# Patient Record
Sex: Female | Born: 1955 | Race: White | Hispanic: No | Smoking: Former smoker
Health system: Southern US, Community
[De-identification: ages and names within clinical notes are randomized; demographics above are authoritative.]

## PROBLEM LIST (undated history)

## (undated) DIAGNOSIS — I10 Essential (primary) hypertension: Secondary | ICD-10-CM

## (undated) DIAGNOSIS — E079 Disorder of thyroid, unspecified: Secondary | ICD-10-CM

---

## 2019-10-07 ENCOUNTER — Ambulatory Visit: Payer: Self-pay | Attending: Internal Medicine

## 2019-10-07 DIAGNOSIS — Z23 Encounter for immunization: Secondary | ICD-10-CM

## 2019-10-07 NOTE — Progress Notes (Signed)
   Covid-19 Vaccination Clinic  Name:  Aliah Eriksson    MRN: [id number] DOB: [birth year]  10/07/2019  Ms. Gunnerson was observed post Covid-19 immunization for 15 minutes without incident. She was provided with Vaccine Information Sheet and instruction to access the V-Safe system.   Ms. Bui was instructed to call 911 with any severe reactions post vaccine: Marland Kitchen Difficulty breathing  . Swelling of face and throat  . A fast heartbeat  . A bad rash all over body  . Dizziness and weakness   Immunizations Administered    Name Date Dose VIS Date Route   Pfizer COVID-19 Vaccine 10/07/2019  9:44 AM 0.3 mL 06/25/2019 Intramuscular   Manufacturer: [place]   Lot: [id number]   [place]: [id number]

## 2019-11-01 ENCOUNTER — Ambulatory Visit: Payer: Self-pay | Attending: Internal Medicine

## 2019-11-01 DIAGNOSIS — Z23 Encounter for immunization: Secondary | ICD-10-CM

## 2019-11-01 NOTE — Progress Notes (Signed)
   Covid-19 Vaccination Clinic  Name:  Acire Tang    MRN: [id number] DOB: 1955-08-12  11/01/2019  Ms. Cowper was observed post Covid-19 immunization for 15 minutes without incident. She was provided with Vaccine Information Sheet and instruction to access the V-Safe system.   Ms. Laughner was instructed to call 911 with any severe reactions post vaccine: Marland Kitchen Difficulty breathing  . Swelling of face and throat  . A fast heartbeat  . A bad rash all over body  . Dizziness and weakness   Immunizations Administered    Name Date Dose VIS Date Route   Pfizer COVID-19 Vaccine 11/01/2019  9:22 AM 0.3 mL 09/08/2018 Intramuscular   Manufacturer: [place]   Lot: [id number]   [place]: [id number]

## 2021-01-17 ENCOUNTER — Inpatient Hospital Stay (HOSPITAL_COMMUNITY)
Admission: EM | Admit: 2021-01-17 | Discharge: 2021-01-19 | DRG: 054 | Disposition: A | Discharge status: Home / Self Care | Payer: 59 | Attending: Family Medicine | Admitting: Family Medicine

## 2021-01-17 ENCOUNTER — Emergency Department (HOSPITAL_COMMUNITY): Payer: 59

## 2021-01-17 ENCOUNTER — Inpatient Hospital Stay (HOSPITAL_COMMUNITY): Payer: 59

## 2021-01-17 ENCOUNTER — Other Ambulatory Visit: Payer: Self-pay

## 2021-01-17 ENCOUNTER — Encounter (HOSPITAL_COMMUNITY): Payer: Self-pay

## 2021-01-17 DIAGNOSIS — C3492 Malignant neoplasm of unspecified part of left bronchus or lung: Secondary | ICD-10-CM | POA: Diagnosis not present

## 2021-01-17 DIAGNOSIS — R630 Anorexia: Secondary | ICD-10-CM | POA: Diagnosis present

## 2021-01-17 DIAGNOSIS — F17290 Nicotine dependence, other tobacco product, uncomplicated: Secondary | ICD-10-CM | POA: Diagnosis present

## 2021-01-17 DIAGNOSIS — Z8052 Family history of malignant neoplasm of bladder: Secondary | ICD-10-CM | POA: Diagnosis not present

## 2021-01-17 DIAGNOSIS — C3402 Malignant neoplasm of left main bronchus: Secondary | ICD-10-CM | POA: Diagnosis present

## 2021-01-17 DIAGNOSIS — C7931 Secondary malignant neoplasm of brain: Secondary | ICD-10-CM | POA: Diagnosis present

## 2021-01-17 DIAGNOSIS — H9201 Otalgia, right ear: Secondary | ICD-10-CM | POA: Diagnosis present

## 2021-01-17 DIAGNOSIS — E785 Hyperlipidemia, unspecified: Secondary | ICD-10-CM | POA: Diagnosis present

## 2021-01-17 DIAGNOSIS — I10 Essential (primary) hypertension: Secondary | ICD-10-CM | POA: Diagnosis present

## 2021-01-17 DIAGNOSIS — E039 Hypothyroidism, unspecified: Secondary | ICD-10-CM | POA: Diagnosis present

## 2021-01-17 DIAGNOSIS — I160 Hypertensive urgency: Secondary | ICD-10-CM | POA: Diagnosis present

## 2021-01-17 DIAGNOSIS — Z20822 Contact with and (suspected) exposure to covid-19: Secondary | ICD-10-CM | POA: Diagnosis present

## 2021-01-17 DIAGNOSIS — E876 Hypokalemia: Secondary | ICD-10-CM | POA: Diagnosis present

## 2021-01-17 DIAGNOSIS — G9389 Other specified disorders of brain: Secondary | ICD-10-CM | POA: Diagnosis present

## 2021-01-17 DIAGNOSIS — R29810 Facial weakness: Secondary | ICD-10-CM | POA: Diagnosis present

## 2021-01-17 DIAGNOSIS — E871 Hypo-osmolality and hyponatremia: Secondary | ICD-10-CM | POA: Diagnosis present

## 2021-01-17 DIAGNOSIS — C771 Secondary and unspecified malignant neoplasm of intrathoracic lymph nodes: Secondary | ICD-10-CM | POA: Diagnosis present

## 2021-01-17 DIAGNOSIS — J90 Pleural effusion, not elsewhere classified: Secondary | ICD-10-CM | POA: Diagnosis present

## 2021-01-17 DIAGNOSIS — J9811 Atelectasis: Secondary | ICD-10-CM | POA: Diagnosis present

## 2021-01-17 DIAGNOSIS — R918 Other nonspecific abnormal finding of lung field: Secondary | ICD-10-CM

## 2021-01-17 DIAGNOSIS — G936 Cerebral edema: Secondary | ICD-10-CM | POA: Diagnosis present

## 2021-01-17 DIAGNOSIS — Z9889 Other specified postprocedural states: Secondary | ICD-10-CM

## 2021-01-17 HISTORY — DX: Disorder of thyroid, unspecified: E07.9

## 2021-01-17 HISTORY — DX: Essential (primary) hypertension: I10

## 2021-01-17 LAB — COMPREHENSIVE METABOLIC PANEL
ALT: 17 U/L (ref 0–44)
AST: 20 U/L (ref 15–41)
Albumin: 4.2 g/dL (ref 3.5–5.0)
Alkaline Phosphatase: 83 U/L (ref 38–126)
Anion gap: 11 (ref 5–15)
BUN: 14 mg/dL (ref 8–23)
CO2: 25 mmol/L (ref 22–32)
Calcium: 9.1 mg/dL (ref 8.9–10.3)
Chloride: 93 mmol/L — ABNORMAL LOW (ref 98–111)
Creatinine, Ser: 0.56 mg/dL (ref 0.44–1.00)
GFR, Estimated: >60 mL/min (ref >60)
Glucose, Bld: 103 mg/dL — ABNORMAL HIGH (ref 70–99)
Potassium: 3.2 mmol/L — ABNORMAL LOW (ref 3.5–5.1)
Sodium: 129 mmol/L — ABNORMAL LOW (ref 135–145)
Total Bilirubin: 0.7 mg/dL (ref 0.3–1.2)
Total Protein: 7.3 g/dL (ref 6.5–8.1)

## 2021-01-17 LAB — CBC WITH DIFFERENTIAL/PLATELET
Abs Immature Granulocytes: 0.04 10*3/uL (ref 0.00–0.07)
Basophils Absolute: 0 10*3/uL (ref 0.0–0.1)
Basophils Relative: 0 %
Eosinophils Absolute: 0.2 10*3/uL (ref 0.0–0.5)
Eosinophils Relative: 2 %
HCT: 36.6 % (ref 36.0–46.0)
Hemoglobin: 12.3 g/dL (ref 12.0–15.0)
Immature Granulocytes: 0 %
Lymphocytes Relative: 11 %
Lymphs Abs: 1.1 10*3/uL (ref 0.7–4.0)
MCH: 29 pg (ref 26.0–34.0)
MCHC: 33.6 g/dL (ref 30.0–36.0)
MCV: 86.3 fL (ref 80.0–100.0)
Monocytes Absolute: 0.9 10*3/uL (ref 0.1–1.0)
Monocytes Relative: 9 %
Neutro Abs: 7.4 10*3/uL (ref 1.7–7.7)
Neutrophils Relative %: 78 %
Platelets: 344 10*3/uL (ref 150–400)
RBC: 4.24 MIL/uL (ref 3.87–5.11)
RDW: 13 % (ref 11.5–15.5)
WBC: 9.7 10*3/uL (ref 4.0–10.5)
nRBC: 0 % (ref 0.0–0.2)

## 2021-01-17 LAB — PROTIME-INR
INR: 1 (ref 0.8–1.2)
Prothrombin Time: 12.8 seconds (ref 11.4–15.2)

## 2021-01-17 LAB — RESP PANEL BY RT-PCR (FLU A&B, COVID) ARPGX2
Influenza A by PCR: NEGATIVE
Influenza B by PCR: NEGATIVE
SARS Coronavirus 2 by RT PCR: NEGATIVE

## 2021-01-17 MED ORDER — ACETAMINOPHEN 650 MG RE SUPP: 650.0000 mg | Freq: Four times a day (QID) | RECTAL | Status: DC | PRN

## 2021-01-17 MED ORDER — HYDRALAZINE HCL 20 MG/ML IJ SOLN
5.0000 mg | INTRAMUSCULAR | Status: DC | PRN
  Administered 2021-01-18 (×2): 5 mg via INTRAVENOUS
  Filled 2021-01-17 (×2): qty 1

## 2021-01-17 MED ORDER — GADOBUTROL 1 MMOL/ML IV SOLN
5.5000 mL | Freq: Once | INTRAVENOUS | Status: AC | PRN
  Administered 2021-01-17: 5.5 mL via INTRAVENOUS

## 2021-01-17 MED ORDER — DEXAMETHASONE 4 MG PO TABS
6.0000 mg | ORAL_TABLET | Freq: Four times a day (QID) | ORAL | Status: DC
  Administered 2021-01-18 – 2021-01-19 (×7): 6 mg via ORAL
  Filled 2021-01-17 (×7): qty 1

## 2021-01-17 MED ORDER — LEVETIRACETAM 500 MG PO TABS
500.0000 mg | ORAL_TABLET | Freq: Two times a day (BID) | ORAL | Status: DC
  Administered 2021-01-17 – 2021-01-19 (×4): 500 mg via ORAL
  Filled 2021-01-17 (×4): qty 1

## 2021-01-17 MED ORDER — POTASSIUM CHLORIDE CRYS ER 20 MEQ PO TBCR
40.0000 meq | EXTENDED_RELEASE_TABLET | Freq: Once | ORAL | Status: AC
  Administered 2021-01-17: 40 meq via ORAL
  Filled 2021-01-17: qty 2

## 2021-01-17 MED ORDER — IOHEXOL 300 MG/ML  SOLN
100.0000 mL | Freq: Once | INTRAMUSCULAR | Status: AC | PRN
  Administered 2021-01-17: 100 mL via INTRAVENOUS

## 2021-01-17 MED ORDER — OLOPATADINE HCL 0.1 % OP SOLN
1.0000 [drp] | Freq: Every day | OPHTHALMIC | Status: DC
  Filled 2021-01-17: qty 5

## 2021-01-17 MED ORDER — ACETAMINOPHEN 325 MG PO TABS: 650.0000 mg | ORAL_TABLET | Freq: Four times a day (QID) | ORAL | Status: DC | PRN

## 2021-01-17 MED ORDER — ATORVASTATIN CALCIUM 10 MG PO TABS
10.0000 mg | ORAL_TABLET | Freq: Every day | ORAL | Status: DC
  Administered 2021-01-18 – 2021-01-19 (×2): 10 mg via ORAL
  Filled 2021-01-17 (×2): qty 1

## 2021-01-17 MED ORDER — LEVOTHYROXINE SODIUM 75 MCG PO TABS
75.0000 ug | ORAL_TABLET | Freq: Every day | ORAL | Status: DC
  Administered 2021-01-18 – 2021-01-19 (×2): 75 ug via ORAL
  Filled 2021-01-17 (×2): qty 1

## 2021-01-17 MED ORDER — DEXAMETHASONE SODIUM PHOSPHATE 10 MG/ML IJ SOLN
10.0000 mg | Freq: Once | INTRAMUSCULAR | Status: AC
  Administered 2021-01-17: 10 mg via INTRAVENOUS
  Filled 2021-01-17: qty 1

## 2021-01-17 MED ORDER — CYCLOSPORINE 0.05 % OP EMUL
1.0000 [drp] | Freq: Two times a day (BID) | OPHTHALMIC | Status: DC
  Administered 2021-01-18 – 2021-01-19 (×3): 1 [drp] via OPHTHALMIC
  Filled 2021-01-17 (×3): qty 1

## 2021-01-17 NOTE — ED Provider Notes (Signed)
[facility] DEPT Provider Note   CSN: [id number] Arrival date & time: 01/17/21  1723     History Chief Complaint  Patient presents with   Sent for Strok rule out    Symptoms Nelda Bucks   Hypertension    Tanicka Bisaillon is a 65 y.o. female.  Patient presents from primary care doctor's office with concern for possible stroke.  For the last 3 to 4 weeks she has noticed right-sided facial droop, right-sided headache, right ear pain, inability to completely close the right eye.  Also had high blood pressure in the office today as well.  The history is provided by the patient.  Headache Pain location:  R temporal Quality:  Dull Radiates to: ear. Severity currently:  5/10 Onset quality:  Gradual Duration:  4 weeks Timing:  Constant Progression:  Unchanged Chronicity:  New Relieved by:  Nothing Worsened by:  Nothing Associated symptoms: ear pain   Associated symptoms: no abdominal pain, no back pain, no blurred vision, no congestion, no cough, no diarrhea, no dizziness, no drainage, no eye pain, no facial pain, no fatigue, no fever, no numbness, no photophobia, no seizures and no weakness       History reviewed. No pertinent past medical history.  There are no problems to display for this patient.   History reviewed. No pertinent surgical history.   OB History   No obstetric history on file.     No family history on file.     Home Medications Prior to Admission medications   Not on File    Allergies    Patient has no allergy information on record.  Review of Systems   Review of Systems  Constitutional:  Negative for fatigue and fever.  HENT:  Positive for ear pain. Negative for congestion and postnasal drip.   Eyes:  Negative for blurred vision, photophobia, pain, discharge, redness, itching and visual disturbance.  Respiratory:  Negative for cough.   Gastrointestinal:  Negative for abdominal pain and diarrhea.  Musculoskeletal:   Negative for back pain.  Neurological:  Positive for facial asymmetry and headaches. Negative for dizziness, tremors, seizures, syncope, speech difficulty, weakness, light-headedness and numbness.   Physical Exam Updated Vital Signs BP (!) 207/94 (BP Location: Left Arm) Comment: taken X3 and no results just ?? on the monitor will retry  Pulse 95   Temp 98 F (36.7 C) (Oral)   Resp 18   SpO2 97%   Physical Exam Vitals and nursing note reviewed.  Constitutional:      General: She is not in acute distress.    Appearance: She is well-developed. She is not ill-appearing.  HENT:     Head: Normocephalic and atraumatic.     Nose: Nose normal.     Mouth/Throat:     Mouth: Mucous membranes are moist.  Eyes:     Extraocular Movements: Extraocular movements intact.     Conjunctiva/sclera: Conjunctivae normal.     Pupils: Pupils are equal, round, and reactive to light.  Cardiovascular:     Rate and Rhythm: Normal rate and regular rhythm.     Pulses: Normal pulses.     Heart sounds: Normal heart sounds. No murmur heard. Pulmonary:     Effort: Pulmonary effort is normal. No respiratory distress.     Breath sounds: Normal breath sounds.  Abdominal:     Palpations: Abdomen is soft.     Tenderness: There is no abdominal tenderness.  Musculoskeletal:     Cervical back: Neck supple.  Skin:    General: Skin is warm and dry.     Capillary Refill: Capillary refill takes less than 2 seconds.  Neurological:     General: No focal deficit present.     Mental Status: She is alert and oriented to person, place, and time.     Sensory: No sensory deficit.     Motor: Weakness present.     Coordination: Coordination normal.     Gait: Gait normal.     Comments: Weakness to the right side of her face including her forehead, incomplete closure of the right eye, EXTR ocular movements are intact, visual fields intact, pupils are equal and reactive bilaterally, sensation intact throughout    ED Results /  Procedures / Treatments   Labs (all labs ordered are listed, but only abnormal results are displayed) Labs Reviewed  COMPREHENSIVE METABOLIC PANEL - Abnormal; Notable for the following components:      Result Value   Sodium 129 (*)    Potassium 3.2 (*)    Chloride 93 (*)    Glucose, Bld 103 (*)    All other components within normal limits  RESP PANEL BY RT-PCR (FLU A&B, COVID) ARPGX2  CBC WITH DIFFERENTIAL/PLATELET  PROTIME-INR    EKG None  Radiology CT Head Wo Contrast  Result Date: 01/17/2021 CLINICAL DATA:  Mental status change EXAM: CT HEAD WITHOUT CONTRAST TECHNIQUE: Contiguous axial images were obtained from the base of the skull through the vertex without intravenous contrast. COMPARISON:  None. FINDINGS: Brain: Extensive hypodensity/edema within the right hemispheric white matter and basal ganglia. 2.7 x 1.9 by 2.3 cm suspected mass within the right temporoparietal region. About 5 mm midline shift to the left. Mass effect on the right lateral ventricle. Possible small chronic appearing infarct in the right cerebellum peripherally. Punctate cortical calcification at the left frontal lobe. Vascular: No hyperdense vessels.  No unexpected calcification. Skull: Normal. Negative for fracture or focal lesion. Sinuses/Orbits: No acute finding. Other: None. IMPRESSION: 1. Extensive white matter edema within the right hemisphere with about 5 mm midline shift to the left and mass effect on right lateral ventricle. Approximate 2.7 cm suspected mass in the right temporoparietal region concerning for metastatic focus or primary brain neoplasm. MRI with and without contrast is recommended for further evaluation. Critical Value/emergent results were called by telephone at the time of interpretation on 01/17/2021 at 6:50 pm to provider Jake in the ED, patient in triage , who verbally acknowledged these results. Electronically Signed   By: Donavan Foil M.D.   On: 01/17/2021 18:51     Procedures Procedures   Medications Ordered in ED Medications  levETIRAcetam (KEPPRA) tablet 500 mg (500 mg Oral Given 01/17/21 1949)  dexamethasone (DECADRON) tablet 6 mg (has no administration in time range)  dexamethasone (DECADRON) injection 10 mg (10 mg Intravenous Given 01/17/21 1947)    ED Course  I have reviewed the triage vital signs and the nursing notes.  Pertinent labs & imaging results that were available during my care of the patient were reviewed by me and considered in my medical decision making (see chart for details).    MDM Rules/Calculators/A&P                          Sarika Baldini is a 65 year old female with history of hypertension who presents to the ED with concern for stroke.  Has had headache for the last 3 to 4 weeks with right ear pain.  Saw  primary care doctor today who was concerned for stroke and sent for evaluation.  Blood pressure 207/94 but otherwise normal vitals.  On exam it appears that she has right-sided facial weakness that includes the forehead, right lower face.  She has incomplete closure of the right eye.  Clinically it almost looks like a Bell's palsy.  No signs of uncal herniation on exam.  CT scanning of the head showed extensive white matter edema within the right hemisphere with 5 mm midline shift to the left with mass-effect on the right lateral ventricle.  There is about a 2.7 cm mass within the right temporal parietal region which is concerning for a primary neoplasm and possibly metastatic focus.  Talked with Dr. Christella Noa with neurosurgery who recommends Decadron and Keppra and admission to medicine.  He is recommending a chest abdomen and pelvis CT to look for any other possibility of masses or cancer.  He will evaluate the patient in the morning.  MRI of brain with and without can be done at some point as well.  Hemodynamically patient stable.  Will admit to medicine.  Final Clinical Impression(s) / ED Diagnoses Final diagnoses:  Brain mass     Rx / DC Orders ED Discharge Orders     None        Lennice Sites, DO 01/17/21 1955

## 2021-01-17 NOTE — H&P (Addendum)
History and Physical    Ortencia Askari MHD:[id number] DOB: [birth year] DOA: 01/17/2021  PCP: Pcp, No Patient coming from: Home  Chief Complaint: Right facial droop  HPI: Loretta Woods is a 65 y.o. female with a past medical history of hypertension presented to the ED with complaints of right-sided facial droop, right-sided headaches, right ear pain and was sent by her PCP due to concern for possible stroke.  Blood pressure elevated on arrival at 207/94, remainder of vital signs stable.  Labs showing WBC 9.7, hemoglobin 12.3, platelet count 344K.  Sodium 129, potassium 3.2, chloride 93, bicarb 25, BUN 14, creatinine 0.5, glucose 103.  LFTs normal.  INR 1.0.  COVID and influenza PCR negative.  Head CT showing extensive white matter edema within the right hemisphere with about 5 mm midline shift to the left and mass-effect on the right lateral ventricle.  Approximate 2.7 cm suspected mass in the right temporoparietal region concerning for metastatic focus or primary brain neoplasm. ED physician discussed the case with Dr. Christella Noa from neurosurgery who recommended admission to medicine service, Decadron 10 mg loading dose followed by Decadron oral 6 mg every 6 hours, and Keppra oral 500 mg twice daily.  Recommended obtaining brain MRI in the morning as well as CT chest/abdomen/pelvis to look for any other masses as her brain mass is suspicious for metastatic disease.  Neurosurgery will consult in the morning.  Patient was given IV Decadron 10 mg and Keppra 500 mg.  CT chest/abdomen/pelvis revealed a 7.5 cm left lower lobe lung mass compatible with primary bronchogenic neoplasm.  Associated postobstructive left lower lobe atelectasis/collapse.  Small loculated left pleural effusion.  Postobstructive opacity versus perilymphatic spread of tumor in the central left upper lobe/perihilar region.  7 mm nodule in the medial left upper lobe, indeterminate, metastasis not excluded.  Thoracic nodal metastasis.  No evidence of  metastatic disease in the abdomen/pelvis.  Patient reports 3-week history of right-sided facial droop, right-sided headaches and right ear pain.  She has not been able to completely close her right eye but her vision is okay.  States she was sent to the ED by her PCP due to concern for stroke versus Bell's palsy.  Denies any weakness in her arm or leg.  Denies numbness/sensory deficit anywhere.  She denies history of brain tumor, lung cancer, or any other malignancy.  Does report longstanding history of cigarette smoking, a pack of cigarettes daily since age of 34 and quit 10 years ago; currently vaping.  No other complaints.  Review of Systems:  All systems reviewed and apart from history of presenting illness, are negative.  History reviewed. No pertinent past medical history.  History reviewed. No pertinent surgical history.   has no history on file for tobacco use, alcohol use, and drug use.  No Known Allergies  History reviewed. No pertinent family history.  Prior to Admission medications   Not on File    Physical Exam: Vitals:   01/17/21 1830 01/17/21 1845 01/17/21 1900 01/17/21 1959  BP: (!) 190/95   (!) 186/153  Pulse: 80 82 78 75  Resp:    18  Temp:      TempSrc:      SpO2: 92% 92% 93% 99%    Physical Exam Constitutional:      General: She is not in acute distress. HENT:     Head: Normocephalic and atraumatic.  Eyes:     Extraocular Movements: Extraocular movements intact.     Conjunctiva/sclera: Conjunctivae normal.  Cardiovascular:  Rate and Rhythm: Normal rate and regular rhythm.     Pulses: Normal pulses.  Pulmonary:     Effort: Pulmonary effort is normal. No respiratory distress.     Breath sounds: Normal breath sounds. No wheezing or rales.  Abdominal:     General: Bowel sounds are normal. There is no distension.     Palpations: Abdomen is soft.     Tenderness: There is no abdominal tenderness.  Musculoskeletal:        General: No swelling or  tenderness.     Cervical back: Normal range of motion and neck supple.  Skin:    General: Skin is warm and dry.  Neurological:     Mental Status: She is alert and oriented to person, place, and time.     Comments: Unable to close right eyelid completely Unable to raise right eyebrow Right-sided facial droop Sensation to light touch intact on both sides of the face. No motor or sensory deficit of upper and lower extremities.     Labs on Admission: I have personally reviewed following labs and imaging studies  CBC: Recent Labs  Lab 01/17/21 1803  WBC 9.7  NEUTROABS 7.4  HGB 12.3  HCT 36.6  MCV 86.3  PLT 284   Basic Metabolic Panel: Recent Labs  Lab 01/17/21 1803  NA 129*  K 3.2*  CL 93*  CO2 25  GLUCOSE 103*  BUN 14  CREATININE 0.56  CALCIUM 9.1   GFR: CrCl cannot be calculated (Unknown ideal weight.). Liver Function Tests: Recent Labs  Lab 01/17/21 1803  AST 20  ALT 17  ALKPHOS 83  BILITOT 0.7  PROT 7.3  ALBUMIN 4.2   No results for input(s): LIPASE, AMYLASE in the last 168 hours. No results for input(s): AMMONIA in the last 168 hours. Coagulation Profile: Recent Labs  Lab 01/17/21 1803  INR 1.0   Cardiac Enzymes: No results for input(s): CKTOTAL, CKMB, CKMBINDEX, TROPONINI in the last 168 hours. BNP (last 3 results) No results for input(s): PROBNP in the last 8760 hours. HbA1C: No results for input(s): HGBA1C in the last 72 hours. CBG: No results for input(s): GLUCAP in the last 168 hours. Lipid Profile: No results for input(s): CHOL, HDL, LDLCALC, TRIG, CHOLHDL, LDLDIRECT in the last 72 hours. Thyroid Function Tests: No results for input(s): TSH, T4TOTAL, FREET4, T3FREE, THYROIDAB in the last 72 hours. Anemia Panel: No results for input(s): VITAMINB12, FOLATE, FERRITIN, TIBC, IRON, RETICCTPCT in the last 72 hours. Urine analysis: No results found for: COLORURINE, APPEARANCEUR, LABSPEC, PHURINE, GLUCOSEU, HGBUR, BILIRUBINUR, KETONESUR,  PROTEINUR, UROBILINOGEN, NITRITE, LEUKOCYTESUR  Radiological Exams on Admission: CT Head Wo Contrast  Result Date: 01/17/2021 CLINICAL DATA:  Mental status change EXAM: CT HEAD WITHOUT CONTRAST TECHNIQUE: Contiguous axial images were obtained from the base of the skull through the vertex without intravenous contrast. COMPARISON:  None. FINDINGS: Brain: Extensive hypodensity/edema within the right hemispheric white matter and basal ganglia. 2.7 x 1.9 by 2.3 cm suspected mass within the right temporoparietal region. About 5 mm midline shift to the left. Mass effect on the right lateral ventricle. Possible small chronic appearing infarct in the right cerebellum peripherally. Punctate cortical calcification at the left frontal lobe. Vascular: No hyperdense vessels.  No unexpected calcification. Skull: Normal. Negative for fracture or focal lesion. Sinuses/Orbits: No acute finding. Other: None. IMPRESSION: 1. Extensive white matter edema within the right hemisphere with about 5 mm midline shift to the left and mass effect on right lateral ventricle. Approximate 2.7 cm suspected mass  in the right temporoparietal region concerning for metastatic focus or primary brain neoplasm. MRI with and without contrast is recommended for further evaluation. Critical Value/emergent results were called by telephone at the time of interpretation on 01/17/2021 at 6:50 pm to provider Jake in the ED, patient in triage , who verbally acknowledged these results. Electronically Signed   By: Donavan Foil M.D.   On: 01/17/2021 18:51   CT CHEST ABDOMEN PELVIS W CONTRAST  Result Date: 01/17/2021 CLINICAL DATA:  Brain mass, staging EXAM: CT CHEST, ABDOMEN, AND PELVIS WITH CONTRAST TECHNIQUE: Multidetector CT imaging of the chest, abdomen and pelvis was performed following the standard protocol during bolus administration of intravenous contrast. CONTRAST:  161mL OMNIPAQUE IOHEXOL 300 MG/ML  SOLN COMPARISON:  None. FINDINGS: CT CHEST FINDINGS  Cardiovascular: The heart is normal in size. No pericardial effusion. No evidence of thoracic aortic aneurysm. Mild atherosclerotic calcifications of the aortic arch. Mild coronary atherosclerosis of the LAD and left circumflex. Mediastinum/Nodes: Thoracic lymphadenopathy, including a 16 mm short axis AP window node (series 2/image 27) and a 16 mm short axis left perihilar node (series 2/image 34). Lungs/Pleura: 7.4 x 5.5 cm posteromedial left lower lobe mass (series 2/image 39), compatible with primary bronchogenic neoplasm. Associated postobstructive atelectasis/collapse involving the left lower lobe. Small loculated left pleural effusion. Additional interlobular septal thickening in the central left upper lobe/perihilar region (series 4/image 56), likely post obstructive due to nodal metastases, although perilymphatic spread of tumor is not excluded. 7 mm nodule in the medial left upper lobe (series 4/image 100). Moderate centrilobular and paraseptal emphysematous changes, upper lung predominant. No focal consolidation. No pneumothorax. Musculoskeletal: No focal osseous lesions. CT ABDOMEN PELVIS FINDINGS Hepatobiliary: Liver is within normal limits. No suspicious/enhancing hepatic lesions. Gallbladder is unremarkable. No intrahepatic or extrahepatic ductal dilatation. Pancreas: Within normal limits. Spleen: Within normal limits. Adrenals/Urinary Tract: Adrenal glands are within normal limits. Kidneys are within normal limits.  No hydronephrosis. Bladder is within normal limits. Stomach/Bowel: Stomach is within normal limits. No evidence of bowel obstruction. Appendix is not discretely visualized. Vascular/Lymphatic: No evidence of abdominal aortic aneurysm. Atherosclerotic calcifications of the abdominal aorta and branch vessels. No suspicious abdominopelvic lymphadenopathy. Reproductive: Calcified uterine fibroids (series 2/image 101). Right ovary is notable for a 2.1 cm cystic lesion (series 2/image 97),  likely benign. Left ovary is within normal limits. Other: No abdominopelvic ascites. Musculoskeletal: Mild degenerative changes of the lumbar spine. No focal osseous lesions. IMPRESSION: 7.5 cm left lower lobe mass, compatible with primary bronchogenic neoplasm. Associated postobstructive left lower lobe atelectasis/collapse. Small loculated left pleural effusion. Postobstructive opacity versus perilymphatic spread of tumor in the central left upper lobe/perihilar region. 7 mm nodule in the medial left upper lobe, indeterminate, metastasis not excluded. Thoracic nodal metastases, as above. No evidence of metastatic disease in the abdomen/pelvis. Electronically Signed   By: Julian Hy M.D.   On: 01/17/2021 20:40    Assessment/Plan Principal Problem:   Brain mass Active Problems:   Lung mass   Hypertensive urgency   Hyponatremia   Hypokalemia   Metastatic disease Brain and lung mass Patient is presenting with complaints of right-sided facial droop, right-sided headaches, right ear pain. Head CT showing extensive white matter edema within the right hemisphere with about 5 mm midline shift to the left and mass-effect on the right lateral ventricle.  Approximate 2.7 cm suspected mass in the right temporoparietal region concerning for metastatic focus or primary brain neoplasm. CT chest/abdomen/pelvis revealed a 7.5 cm left lower lobe lung mass compatible  with primary bronchogenic neoplasm.  Associated postobstructive left lower lobe atelectasis/collapse.  Small loculated left pleural effusion.  Postobstructive opacity versus perilymphatic spread of tumor in the central left upper lobe/perihilar region.  7 mm nodule in the medial left upper lobe, indeterminate, metastasis not excluded.  Thoracic nodal metastasis.  No evidence of metastatic disease in the abdomen/pelvis. -Neurosurgery consulted.  Continue Decadron and Keppra.  Follow seizure precautions.  Brain MRI with and without contrast ordered.   Consult oncology in the morning.  Hypertensive urgency Blood pressure elevated with systolic 102-725.  Patient states she is taking her home medications but does not remember the names. -IV hydralazine as needed.  Resume home medications after pharmacy med rec is done.  Hyponatremia ?SIADH in the setting of lung mass.  Takes a thiazide diuretic at home which could also be contributing. -Check serum osmolarity, monitor sodium level closely.  Hold home HCTZ.  Check TSH level.  Mild hypokalemia -Monitor potassium and magnesium levels, replenish as needed.  Hyperlipidemia -Continue Lipitor  Hypothyroidism -Continue levothyroxine, check TSH level  DVT prophylaxis: SCDs Code Status: Full code Family Communication: No family available at this time. Disposition Plan: Status is: Inpatient  Remains inpatient appropriate because:Ongoing diagnostic testing needed not appropriate for outpatient work up and Inpatient level of care appropriate due to severity of illness  Dispo: The patient is from: Home              Anticipated d/c is to: Home              Patient currently is not medically stable to d/c.   Difficult to place patient No  Level of care: Level of care: Telemetry  The medical decision making on this patient was of high complexity and the patient is at high risk for clinical deterioration, therefore this is a level 3 visit.  Shela Leff MD [facility]  If 7PM-7AM, please contact night-coverage [email or web address]  01/17/2021, 9:40 PM

## 2021-01-17 NOTE — ED Notes (Signed)
Patient ambulatory to restroom with steady gait.

## 2021-01-17 NOTE — ED Triage Notes (Signed)
Patient sent over from PCP for stroke workup.   Patient reports symptoms started 3 weeks ago.  Pain under right upper neck area and un able to move eyebrow muscles as good as left eye and unable to smile on right side.    A/Ox4 Hx: Hypertension and takes BP medication regulatory.

## 2021-01-17 NOTE — ED Notes (Signed)
Patient returned from MRI.

## 2021-01-17 NOTE — ED Provider Notes (Signed)
Emergency Medicine Provider Triage Evaluation Note  Loretta Woods , a 65 y.o. female  was evaluated in triage.  Pt complains of stroke like symptoms for the past 3 weeks including right facial droop, left eye drooping.   Review of Systems  Positive: Headache,  Negative: Nausea, vomiting, focal weakness.   Physical Exam  There were no vitals taken for this visit. Gen:   Awake, no distress   Resp:  Normal effort  MSK:   Moves extremities without difficulty  Other:  Right facial droop, unable to lift her right eyebrow.  There is lagging to her left eye.  No focal weakness to her upper or lower extremities.    Medical Decision Making  Medically screening exam initiated at 5:46 PM.  Appropriate orders placed.  Zelma Snead was informed that the remainder of the evaluation will be completed by another provider, this initial triage assessment does not replace that evaluation, and the importance of remaining in the ED until their evaluation is complete.  Ambulatory from PCPs office.   Janeece Fitting, PA-C 01/17/21 1746    Lennice Sites, DO 01/17/21 1800

## 2021-01-17 NOTE — Progress Notes (Signed)
Patient ID: Loretta Woods, female   DOB: Jun 25, 1956, 65 y.o.   MRN: [id number] BP (!) 207/94 (BP Location: Left Arm) Comment: taken X3 and no results just ?? on the monitor will retry  Pulse 95   Temp 98 F (36.7 C) (Oral)   Resp 18   SpO2 97%  Films reviewed and I have spoken with Dr. Ronnald Nian. Recommended medicine admission, decadron, and Keppra. She will need Chest, abdomen, and pelvis CT. Will also need MRI , can wait until tomorrow for MRI. Will see tomorrow once MRI completed. Appears to be metastatic.

## 2021-01-18 ENCOUNTER — Inpatient Hospital Stay (HOSPITAL_COMMUNITY): Payer: 59

## 2021-01-18 ENCOUNTER — Encounter (HOSPITAL_COMMUNITY): Payer: Self-pay | Admitting: Internal Medicine

## 2021-01-18 DIAGNOSIS — G9389 Other specified disorders of brain: Secondary | ICD-10-CM

## 2021-01-18 DIAGNOSIS — I160 Hypertensive urgency: Secondary | ICD-10-CM

## 2021-01-18 DIAGNOSIS — E871 Hypo-osmolality and hyponatremia: Secondary | ICD-10-CM

## 2021-01-18 DIAGNOSIS — J90 Pleural effusion, not elsewhere classified: Secondary | ICD-10-CM

## 2021-01-18 DIAGNOSIS — E876 Hypokalemia: Secondary | ICD-10-CM

## 2021-01-18 DIAGNOSIS — R918 Other nonspecific abnormal finding of lung field: Secondary | ICD-10-CM

## 2021-01-18 LAB — OSMOLALITY: Osmolality: 285 mOsm/kg (ref 275–295)

## 2021-01-18 LAB — BASIC METABOLIC PANEL
Anion gap: 13 (ref 5–15)
BUN: 13 mg/dL (ref 8–23)
CO2: 26 mmol/L (ref 22–32)
Calcium: 10.2 mg/dL (ref 8.9–10.3)
Chloride: 95 mmol/L — ABNORMAL LOW (ref 98–111)
Creatinine, Ser: 0.62 mg/dL (ref 0.44–1.00)
GFR, Estimated: >60 mL/min (ref >60)
Glucose, Bld: 133 mg/dL — ABNORMAL HIGH (ref 70–99)
Potassium: 4.4 mmol/L (ref 3.5–5.1)
Sodium: 134 mmol/L — ABNORMAL LOW (ref 135–145)

## 2021-01-18 LAB — BODY FLUID CELL COUNT WITH DIFFERENTIAL
Eos, Fluid: 0 %
Lymphs, Fluid: 44 %
Monocyte-Macrophage-Serous Fluid: 49 % — ABNORMAL LOW (ref 50–90)
Neutrophil Count, Fluid: 7 % (ref 0–25)
Total Nucleated Cell Count, Fluid: 1557 cu mm — ABNORMAL HIGH (ref 0–1000)

## 2021-01-18 LAB — HIV ANTIBODY (ROUTINE TESTING W REFLEX): HIV Screen 4th Generation wRfx: NONREACTIVE

## 2021-01-18 LAB — PROTEIN, PLEURAL OR PERITONEAL FLUID: Total protein, fluid: 3.9 g/dL

## 2021-01-18 LAB — LACTATE DEHYDROGENASE, PLEURAL OR PERITONEAL FLUID: LD, Fluid: 216 U/L — ABNORMAL HIGH (ref 3–23)

## 2021-01-18 LAB — MAGNESIUM: Magnesium: 2.3 mg/dL (ref 1.7–2.4)

## 2021-01-18 LAB — TSH: TSH: 2.937 u[IU]/mL (ref 0.350–4.500)

## 2021-01-18 MED ORDER — AMLODIPINE BESYLATE 5 MG PO TABS
5.0000 mg | ORAL_TABLET | Freq: Every day | ORAL | Status: DC
  Administered 2021-01-18 – 2021-01-19 (×2): 5 mg via ORAL
  Filled 2021-01-18 (×2): qty 1

## 2021-01-18 NOTE — H&P (View-Only) (Signed)
NAME:  Loretta Woods, MRN:  [id number], DOB:  1956/01/26, LOS: 1 ADMISSION DATE:  01/17/2021, CONSULTATION DATE:  01/18/21 REFERRING MD:  Beryl Meager CHIEF COMPLAINT:  eye weakness   History of Present Illness:  65 year old former smoker who presented to PCP with eye weakness sent to the emergency room found to have brain mass with vasogenic edema and subsequent discovery of left lower lobe lung mass for which we are consulted.  Patient report about 3-week history of right thigh weakness.  Cannot move eyebrows as well.  Unable to completely close her right eye.  Denies vision changes.  There are some inability to smile as well on the right as well.  Saw PCP for this.  PCP was concerned for stroke.  Sent to the ED.  CT head was notable for right-sided brain lesion with significant vasogenic edema surrounding.  CT chest was obtained which shows a large left lower lung mass, small pleural effusion with lymphadenopathy on my interpretation.  She denies significant shortness of breath.  Does note she has felt more weak, rundown lately.  She smoked from the age of 32 to early 39s.  Pertinent  Medical History  Hypertension  Significant Hospital Events: Including procedures, antibiotic start and stop dates in addition to other pertinent events   7/6: Admitted to hospital with brain lesion and left lower lobe lung mass 7/7: PCCM consult, thoracentesis performed, arranging bronchoscopy and biopsy for 7/8  Interim History / Subjective:  As above  Objective   Blood pressure (!) 175/86, pulse 90, temperature 98 F (36.7 C), temperature source Oral, resp. rate 16, SpO2 97 %.       No intake or output data in the 24 hours ending 01/18/21 0925 There were no vitals filed for this visit.  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 Thoracentesis for small L effusion today. Will schedule bronchoscopy for tomorrow at [facility], transportation will be arranged. Procedure explained in detail to patient. --f/u pleural fluid studies --Continue decadron 6 mg q6 hrs as scheduled through tomorrow, variation to schedule dose can change thereafter at discretion of primary or other consulting teams --NPO at [facility]  PCCM will sign off. We will help coordinate OP follow up.  Best Practice (right click and "Reselect all SmartList Selections" daily)   Per primary  Labs   CBC: Recent Labs  Lab 01/17/21 1803  WBC 9.7  NEUTROABS 7.4  HGB 12.3  HCT 36.6  MCV 86.3  PLT 401    Basic Metabolic Panel: Recent Labs  Lab 01/17/21 1803 01/18/21 0504  NA 129* 134*  K 3.2* 4.4  CL 93* 95*  CO2 25 26  GLUCOSE 103* 133*  BUN 14 13  CREATININE 0.56 0.62  CALCIUM 9.1 10.2  MG  --  2.3   GFR: CrCl cannot be calculated (Unknown ideal weight.). Recent Labs  Lab 01/17/21 1803  WBC 9.7    Liver Function Tests: Recent  Labs  Lab 01/17/21 1803  AST 20  ALT 17  ALKPHOS 83  BILITOT 0.7  PROT 7.3  ALBUMIN 4.2   No results for input(s): LIPASE, AMYLASE in the last 168 hours. No results for input(s): AMMONIA in the last 168 hours.  ABG No results found for: PHART, PCO2ART, PO2ART, HCO3, TCO2, ACIDBASEDEF, O2SAT   Coagulation Profile: Recent Labs  Lab 01/17/21 1803  INR 1.0    Cardiac Enzymes: No results for input(s): CKTOTAL, CKMB, CKMBINDEX, TROPONINI in the last 168  hours.  HbA1C: No results found for: HGBA1C  CBG: No results for input(s): GLUCAP in the last 168 hours.  Review of Systems:   No orthopnea or PND, no LE edema. Comprehensive ROS otherwise negative.   Past Medical History:  She,  has no past medical history on file.   Surgical History:  History reviewed. No pertinent surgical history.   Social History:   reports that she quit smoking about 10 years ago. Her smoking use included cigarettes. She smoked an average of 1.00 packs per day. She has never used smokeless tobacco. She reports that she does not drink alcohol and does not use drugs.   Family History:  Her family history is not on file.   Allergies No Known Allergies   Home Medications  Prior to Admission medications   Medication Sig Start Date End Date Taking? Authorizing Provider  acetaminophen (TYLENOL) 500 MG tablet Take 500 mg by mouth every 6 (six) hours as needed for moderate pain.   Yes [provider]  atorvastatin (LIPITOR) 10 MG tablet Take 10 mg by mouth daily. 10/28/20  Yes [provider]  cycloSPORINE (RESTASIS) 0.05 % ophthalmic emulsion Place 1 drop into both eyes 2 (two) times daily.   Yes [provider]  EUTHYROX 75 MCG tablet Take 75 mcg by mouth daily. 10/28/20  Yes [provider]  hydrochlorothiazide (HYDRODIURIL) 50 MG tablet Take 25 mg by mouth daily. 11/30/20  Yes [provider]  Multiple Vitamins-Minerals (EMERGEN-C IMMUNE) PACK Take 1 packet by mouth daily as needed (immune support).   Yes [provider]  olopatadine (PATANOL) 0.1 % ophthalmic solution Place 1 drop into both eyes daily.   Yes [provider]     Critical care time: n/a

## 2021-01-18 NOTE — Progress Notes (Signed)
[facility] set up to have pt transferred from [facility] to Short Stay at [facility] by 0900 on 01/19/21. Pt's RN, Denice Paradise, aware to have consent form signed and sent with pt for the procedure tomorrow 01/19/21. Jobe Igo, RN

## 2021-01-18 NOTE — Consult Note (Signed)
NAME:  Loretta Woods, MRN:  [id number], DOB:  07-Mar-1956, LOS: 1 ADMISSION DATE:  01/17/2021, CONSULTATION DATE:  01/18/21 REFERRING MD:  Beryl Meager CHIEF COMPLAINT:  eye weakness   History of Present Illness:  65 year old former smoker who presented to PCP with eye weakness sent to the emergency room found to have brain mass with vasogenic edema and subsequent discovery of left lower lobe lung mass for which we are consulted.  Patient report about 3-week history of right thigh weakness.  Cannot move eyebrows as well.  Unable to completely close her right eye.  Denies vision changes.  There are some inability to smile as well on the right as well.  Saw PCP for this.  PCP was concerned for stroke.  Sent to the ED.  CT head was notable for right-sided brain lesion with significant vasogenic edema surrounding.  CT chest was obtained which shows a large left lower lung mass, small pleural effusion with lymphadenopathy on my interpretation.  She denies significant shortness of breath.  Does note she has felt more weak, rundown lately.  She smoked from the age of 60 to early 77s.  Pertinent  Medical History  Hypertension  Significant Hospital Events: Including procedures, antibiotic start and stop dates in addition to other pertinent events   7/6: Admitted to hospital with brain lesion and left lower lobe lung mass 7/7: PCCM consult, thoracentesis performed, arranging bronchoscopy and biopsy for 7/8  Interim History / Subjective:  As above  Objective   Blood pressure (!) 175/86, pulse 90, temperature 98 F (36.7 C), temperature source Oral, resp. rate 16, SpO2 97 %.       No intake or output data in the 24 hours ending 01/18/21 0925 There were no vitals filed for this visit.  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 Thoracentesis for small L effusion today. Will schedule bronchoscopy for tomorrow at [facility], transportation will be arranged. Procedure explained in detail to patient. --f/u pleural fluid studies --Continue decadron 6 mg q6 hrs as scheduled through tomorrow, variation to schedule dose can change thereafter at discretion of primary or other consulting teams --NPO at [facility]  PCCM will sign off. We will help coordinate OP follow up.  Best Practice (right click and "Reselect all SmartList Selections" daily)   Per primary  Labs   CBC: Recent Labs  Lab 01/17/21 1803  WBC 9.7  NEUTROABS 7.4  HGB 12.3  HCT 36.6  MCV 86.3  PLT 109    Basic Metabolic Panel: Recent Labs  Lab 01/17/21 1803 01/18/21 0504  NA 129* 134*  K 3.2* 4.4  CL 93* 95*  CO2 25 26  GLUCOSE 103* 133*  BUN 14 13  CREATININE 0.56 0.62  CALCIUM 9.1 10.2  MG  --  2.3   GFR: CrCl cannot be calculated (Unknown ideal weight.). Recent Labs  Lab 01/17/21 1803  WBC 9.7    Liver Function Tests: Recent  Labs  Lab 01/17/21 1803  AST 20  ALT 17  ALKPHOS 83  BILITOT 0.7  PROT 7.3  ALBUMIN 4.2   No results for input(s): LIPASE, AMYLASE in the last 168 hours. No results for input(s): AMMONIA in the last 168 hours.  ABG No results found for: PHART, PCO2ART, PO2ART, HCO3, TCO2, ACIDBASEDEF, O2SAT   Coagulation Profile: Recent Labs  Lab 01/17/21 1803  INR 1.0    Cardiac Enzymes: No results for input(s): CKTOTAL, CKMB, CKMBINDEX, TROPONINI in the last 168  hours.  HbA1C: No results found for: HGBA1C  CBG: No results for input(s): GLUCAP in the last 168 hours.  Review of Systems:   No orthopnea or PND, no LE edema. Comprehensive ROS otherwise negative.   Past Medical History:  She,  has no past medical history on file.   Surgical History:  History reviewed. No pertinent surgical history.   Social History:   reports that she quit smoking about 10 years ago. Her smoking use included cigarettes. She smoked an average of 1.00 packs per day. She has never used smokeless tobacco. She reports that she does not drink alcohol and does not use drugs.   Family History:  Her family history is not on file.   Allergies No Known Allergies   Home Medications  Prior to Admission medications   Medication Sig Start Date End Date Taking? Authorizing Provider  acetaminophen (TYLENOL) 500 MG tablet Take 500 mg by mouth every 6 (six) hours as needed for moderate pain.   Yes [provider]  atorvastatin (LIPITOR) 10 MG tablet Take 10 mg by mouth daily. 10/28/20  Yes [provider]  cycloSPORINE (RESTASIS) 0.05 % ophthalmic emulsion Place 1 drop into both eyes 2 (two) times daily.   Yes [provider]  EUTHYROX 75 MCG tablet Take 75 mcg by mouth daily. 10/28/20  Yes [provider]  hydrochlorothiazide (HYDRODIURIL) 50 MG tablet Take 25 mg by mouth daily. 11/30/20  Yes [provider]  Multiple Vitamins-Minerals (EMERGEN-C IMMUNE) PACK Take 1 packet by mouth daily as needed (immune support).   Yes [provider]  olopatadine (PATANOL) 0.1 % ophthalmic solution Place 1 drop into both eyes daily.   Yes [provider]     Critical care time: n/a

## 2021-01-18 NOTE — Progress Notes (Signed)
PROGRESS NOTE  Loretta Woods  UVO:[id number] DOB: [birth year] DOA: 01/17/2021 PCP: Merryl Hacker, No   Brief Narrative: Loretta Woods is a 65 y.o. female with a history of tobacco use, HTN who was sent to the ED 7/7 from PCP's office for concern of stroke after presenting with 3 weeks of worsening right-sided headache, facial weakness, and severe HTN (207/94). CT head revealed a right temporoparietal mass with associated vasogenic edema, mass effect on right ventricle and 6mm midline shift. Decadron and keppra started. MRI brain the following morning confirmedthe mass w/ midline shift. CT chest/abd/pelvis revealed a 7.5cm LLL lesion with associated loculated effusion as well as a 72mm medial LUL nodule and thoracic nodal metastasis. PCCM was consulted, performed thoracentesis, sent for cytology, and is arranging bronchoscopy/biopsy. Neurosurgery consultation is pending.   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  - I've consulted PCCM who performed thoracentesis for cytology (pending) and will arrange navigational bronch for biopsy 7/8 at [facility]. NPO p MN, holding pharmacologic VTE ppx pending biopsy.  - Continue decadron for vasogenic edema and keppra for seizure prophylaxis.  - Staging scans done. I discussed with oncology, Dr. Lorenso Courier, who will see the patient and plan to plug her into onc clinic after tissue diagnosis obtained. If no surgery planned, would need radioation oncology consultation.  - Neurosurgery evaluation pending.    Hypertensive urgency: Improving though BPs overall elevated.  - Add norvasc 5mg  since HCTZ on hold.    Hyponatremia: Improving. Isoosmolar - Hold HCTZ   Hypokalemia: Resolved. Mg 2.3.   Hyperlipidemia - Continue statin    Hypothyroidism: TSH 2.937 - Continue synthroid.   DVT prophylaxis: SCDs Code Status: Full Family Communication: None at bedside Disposition Plan:  Status is: Inpatient  Remains inpatient appropriate because:Ongoing diagnostic testing needed not appropriate for outpatient work up  Dispo: The patient is from: Home              Anticipated d/c is to: Home              Patient currently is not medically stable to d/c.   Difficult to place patient No  Consultants:  Neurosurgery, Dr. Christella Noa PCCM Oncology  Procedures:  Thoracentesis 01/18/2021 by PCCM  Antimicrobials: None   Subjective: Feels fatigued, still with some right sided headache without vision or speech changes. No dyspnea or chest pain.   Objective: Vitals:   01/18/21 0945 01/18/21 1107 01/18/21 1200 01/18/21 1301  BP: (!) 195/87 (!) 191/94 (!) 164/74 (!) 169/82  Pulse: 94  95 96  Resp: 17  17 20   Temp:    97.8 F (36.6 C)  TempSrc:    Oral  SpO2: 98%  98% 100%   No intake or output data in the 24 hours ending 01/18/21 1617 There were no vitals filed for this visit.  Gen: 65 y.o. female in no distress, thin Pulm: Non-labored breathing room air, diminished at left base. No crackles or wheezes.  CV: Regular rate and rhythm. No murmur, rub, or gallop. No JVD, no pitting pedal edema. GI: Abdomen soft, non-tender, non-distended, with normoactive bowel sounds. No organomegaly or masses felt. Ext: Warm, no deformities Skin: No rashes, lesions or ulcers Neuro: Alert and oriented. No focal neurological deficits. Psych: Judgement and insight appear normal. Mood & affect  appropriate.   Data Reviewed: I have personally reviewed following labs and imaging studies  CBC: Recent Labs  Lab 01/17/21 1803  WBC 9.7  NEUTROABS 7.4  HGB 12.3  HCT 36.6  MCV 86.3  PLT 937   Basic Metabolic Panel: Recent Labs  Lab 01/17/21 1803 01/18/21 0504  NA 129* 134*  K 3.2* 4.4  CL 93* 95*  CO2 25 26  GLUCOSE 103* 133*   BUN 14 13  CREATININE 0.56 0.62  CALCIUM 9.1 10.2  MG  --  2.3   GFR: CrCl cannot be calculated (Unknown ideal weight.). Liver Function Tests: Recent Labs  Lab 01/17/21 1803  AST 20  ALT 17  ALKPHOS 83  BILITOT 0.7  PROT 7.3  ALBUMIN 4.2   No results for input(s): LIPASE, AMYLASE in the last 168 hours. No results for input(s): AMMONIA in the last 168 hours. Coagulation Profile: Recent Labs  Lab 01/17/21 1803  INR 1.0   Cardiac Enzymes: No results for input(s): CKTOTAL, CKMB, CKMBINDEX, TROPONINI in the last 168 hours. BNP (last 3 results) No results for input(s): PROBNP in the last 8760 hours. HbA1C: No results for input(s): HGBA1C in the last 72 hours. CBG: No results for input(s): GLUCAP in the last 168 hours. Lipid Profile: No results for input(s): CHOL, HDL, LDLCALC, TRIG, CHOLHDL, LDLDIRECT in the last 72 hours. Thyroid Function Tests: Recent Labs    01/18/21 0504  TSH 2.937   Anemia Panel: No results for input(s): VITAMINB12, FOLATE, FERRITIN, TIBC, IRON, RETICCTPCT in the last 72 hours. Urine analysis: No results found for: COLORURINE, APPEARANCEUR, LABSPEC, PHURINE, GLUCOSEU, HGBUR, BILIRUBINUR, KETONESUR, PROTEINUR, UROBILINOGEN, NITRITE, LEUKOCYTESUR Recent Results (from the past 240 hour(s))  Resp Panel by RT-PCR (Flu A&B, Covid) Nasopharyngeal Swab     Status: None   Collection Time: 01/17/21  7:15 PM   Specimen: Nasopharyngeal Swab; Nasopharyngeal(NP) swabs in vial transport medium  Result Value Ref Range Status   SARS Coronavirus 2 by RT PCR NEGATIVE NEGATIVE Final    Comment: (NOTE) SARS-CoV-2 target nucleic acids are NOT DETECTED.  The SARS-CoV-2 RNA is generally detectable in upper respiratory specimens during the acute phase of infection. The lowest concentration of SARS-CoV-2 viral copies this assay can detect is 138 copies/mL. A negative result does not preclude SARS-Cov-2 infection and should not be used as the sole basis for treatment  or other patient management decisions. A negative result may occur with  improper specimen collection/handling, submission of specimen other than nasopharyngeal swab, presence of viral mutation(s) within the areas targeted by this assay, and inadequate number of viral copies(<138 copies/mL). A negative result must be combined with clinical observations, patient history, and epidemiological information. The expected result is Negative.  Fact Sheet for Patients:  EntrepreneurPulse.com.au  Fact Sheet for Healthcare Providers:  IncredibleEmployment.be  This test is no t yet approved or cleared by the [place] FDA and  has been authorized for detection and/or diagnosis of SARS-CoV-2 by FDA under an Emergency Use Authorization (EUA). This EUA will remain  in effect (meaning this test can be used) for the duration of the COVID-19 declaration under Section 564(b)(1) of the Act, 21 U.S.C.section 360bbb-3(b)(1), unless the authorization is terminated  or revoked sooner.       Influenza A by PCR NEGATIVE NEGATIVE Final   Influenza B by PCR NEGATIVE NEGATIVE Final    Comment: (NOTE) The Xpert Xpress SARS-CoV-2/FLU/RSV plus assay is intended as an aid in the diagnosis of influenza from Nasopharyngeal swab specimens and should  not be used as a sole basis for treatment. Nasal washings and aspirates are unacceptable for Xpert Xpress SARS-CoV-2/FLU/RSV testing.  Fact Sheet for Patients: EntrepreneurPulse.com.au  Fact Sheet for Healthcare Providers: IncredibleEmployment.be  This test is not yet approved or cleared by the [place] FDA and has been authorized for detection and/or diagnosis of SARS-CoV-2 by FDA under an Emergency Use Authorization (EUA). This EUA will remain in effect (meaning this test can be used) for the duration of the COVID-19 declaration under Section 564(b)(1) of the Act, 21 U.S.C. section  360bbb-3(b)(1), unless the authorization is terminated or revoked.  Performed at [facility], [place] [address] [place], [place] [address]   Body fluid culture w Gram Stain     Status: None (Preliminary result)   Collection Time: 01/18/21  9:16 AM   Specimen: Pleura; Body Fluid  Result Value Ref Range Status   Specimen Description   Final    PLEURAL LT Performed at [place] [address], [place], [place] [address]    Special Requests   Final    NONE Performed at [facility], [place] [address], [place], [place] [address]    Gram Stain   Final    FEW WBC PRESENT, PREDOMINANTLY MONONUCLEAR NO ORGANISMS SEEN Performed at [facility], [place] [address], [place], [place] [address]    Culture PENDING  Incomplete   Report Status PENDING  Incomplete      Radiology Studies: CT Head Wo Contrast  Result Date: 01/17/2021 CLINICAL DATA:  Mental status change EXAM: CT HEAD WITHOUT CONTRAST TECHNIQUE: Contiguous axial images were obtained from the base of the skull through the vertex without intravenous contrast. COMPARISON:  None. FINDINGS: Brain: Extensive hypodensity/edema within the right hemispheric white matter and basal ganglia. 2.7 x 1.9 by 2.3 cm suspected mass within the right temporoparietal region. About 5 mm midline shift to the left. Mass effect on the right lateral ventricle. Possible small chronic appearing infarct in the right cerebellum peripherally. Punctate cortical calcification at the left frontal lobe. Vascular: No hyperdense vessels.  No unexpected calcification. Skull: Normal. Negative for fracture or focal lesion. Sinuses/Orbits: No acute finding. Other: None. IMPRESSION: 1. Extensive white matter edema within the right hemisphere with about 5 mm midline shift to the left and mass effect on right lateral ventricle. Approximate 2.7 cm suspected mass in the right temporoparietal region concerning for metastatic  focus or primary brain neoplasm. MRI with and without contrast is recommended for further evaluation. Critical Value/emergent results were called by telephone at the time of interpretation on 01/17/2021 at 6:50 pm to provider Jake in the ED, patient in triage , who verbally acknowledged these results. Electronically Signed   By: Donavan Foil M.D.   On: 01/17/2021 18:51   MR BRAIN W WO CONTRAST  Result Date: 01/17/2021 CLINICAL DATA:  Brain mass EXAM: MRI HEAD WITHOUT AND WITH CONTRAST TECHNIQUE: Multiplanar, multiecho pulse sequences of the brain and surrounding structures were obtained without and with intravenous contrast. CONTRAST:  5.59mL GADAVIST GADOBUTROL 1 MMOL/ML IV SOLN COMPARISON:  Head CT 01/17/2021 FINDINGS: Brain: There are multiple enhancing masses within the brain. The largest lesion is in the right temporal lobe. There are solid and necrotic components of this mass. The solid component measures approximately 3.6 x 2.7 cm may reflect a component of dural reaction. The necrotic portion measures approximately 4.0 x 2.0 cm. More superior right temporal mass measures 2.0 x 1.8 cm. There are subcentimeter lesions in  both occipital lobes, the left temporal lobe, right parietal lobe and the cerebellum. The lesions are annotated on series 16. There is severe edema within the right temporal and parietal lobes extending into the area of the right internal and external capsules. There is petechial hemorrhage associated with the anterior right temporal lesion. There is 4 mm of leftward midline shift. Vascular: Major flow voids are preserved. Skull and upper cervical spine: Normal calvarium and skull base. Visualized upper cervical spine and soft tissues are normal. Sinuses/Orbits:No paranasal sinus fluid levels or advanced mucosal thickening. Small amount of mastoid fluid. Normal orbits. IMPRESSION: Multiple enhancing masses within the brain, consistent with metastatic disease. The largest lesion is in the  right temporal lobe with severe surrounding edema and 4 mm of leftward midline shift. Electronically Signed   By: Ulyses Jarred M.D.   On: 01/17/2021 23:31   CT CHEST ABDOMEN PELVIS W CONTRAST  Result Date: 01/17/2021 CLINICAL DATA:  Brain mass, staging EXAM: CT CHEST, ABDOMEN, AND PELVIS WITH CONTRAST TECHNIQUE: Multidetector CT imaging of the chest, abdomen and pelvis was performed following the standard protocol during bolus administration of intravenous contrast. CONTRAST:  121mL OMNIPAQUE IOHEXOL 300 MG/ML  SOLN COMPARISON:  None. FINDINGS: CT CHEST FINDINGS Cardiovascular: The heart is normal in size. No pericardial effusion. No evidence of thoracic aortic aneurysm. Mild atherosclerotic calcifications of the aortic arch. Mild coronary atherosclerosis of the LAD and left circumflex. Mediastinum/Nodes: Thoracic lymphadenopathy, including a 16 mm short axis AP window node (series 2/image 27) and a 16 mm short axis left perihilar node (series 2/image 34). Lungs/Pleura: 7.4 x 5.5 cm posteromedial left lower lobe mass (series 2/image 39), compatible with primary bronchogenic neoplasm. Associated postobstructive atelectasis/collapse involving the left lower lobe. Small loculated left pleural effusion. Additional interlobular septal thickening in the central left upper lobe/perihilar region (series 4/image 56), likely post obstructive due to nodal metastases, although perilymphatic spread of tumor is not excluded. 7 mm nodule in the medial left upper lobe (series 4/image 100). Moderate centrilobular and paraseptal emphysematous changes, upper lung predominant. No focal consolidation. No pneumothorax. Musculoskeletal: No focal osseous lesions. CT ABDOMEN PELVIS FINDINGS Hepatobiliary: Liver is within normal limits. No suspicious/enhancing hepatic lesions. Gallbladder is unremarkable. No intrahepatic or extrahepatic ductal dilatation. Pancreas: Within normal limits. Spleen: Within normal limits. Adrenals/Urinary  Tract: Adrenal glands are within normal limits. Kidneys are within normal limits.  No hydronephrosis. Bladder is within normal limits. Stomach/Bowel: Stomach is within normal limits. No evidence of bowel obstruction. Appendix is not discretely visualized. Vascular/Lymphatic: No evidence of abdominal aortic aneurysm. Atherosclerotic calcifications of the abdominal aorta and branch vessels. No suspicious abdominopelvic lymphadenopathy. Reproductive: Calcified uterine fibroids (series 2/image 101). Right ovary is notable for a 2.1 cm cystic lesion (series 2/image 97), likely benign. Left ovary is within normal limits. Other: No abdominopelvic ascites. Musculoskeletal: Mild degenerative changes of the lumbar spine. No focal osseous lesions. IMPRESSION: 7.5 cm left lower lobe mass, compatible with primary bronchogenic neoplasm. Associated postobstructive left lower lobe atelectasis/collapse. Small loculated left pleural effusion. Postobstructive opacity versus perilymphatic spread of tumor in the central left upper lobe/perihilar region. 7 mm nodule in the medial left upper lobe, indeterminate, metastasis not excluded. Thoracic nodal metastases, as above. No evidence of metastatic disease in the abdomen/pelvis. Electronically Signed   By: Julian Hy M.D.   On: 01/17/2021 20:40   DG CHEST PORT 1 VIEW  Result Date: 01/18/2021 CLINICAL DATA:  Status post left-sided thoracentesis. EXAM: PORTABLE CHEST 1 VIEW COMPARISON:  CT chest from  yesterday. FINDINGS: Normal heart size. Normal pulmonary vascularity. Resolved left pleural effusion. No pneumothorax. The lungs remain hyperinflated with emphysematous changes. Large mass in the medial left lower lobe grossly unchanged. Mild linear scarring in the lingula. The right lung is clear. No acute osseous abnormality. IMPRESSION: 1. Resolved left pleural effusion. No pneumothorax. 2. Unchanged left lower lobe mass. Electronically Signed   By: Titus Dubin M.D.   On:  01/18/2021 10:20    Scheduled Meds:  atorvastatin  10 mg Oral Daily   cycloSPORINE  1 drop Both Eyes BID   dexamethasone  6 mg Oral Q6H   levETIRAcetam  500 mg Oral BID   levothyroxine  75 mcg Oral Daily   olopatadine  1 drop Both Eyes Daily   Continuous Infusions:   LOS: 1 day   Time spent: 35 minutes.  Patrecia Pour, MD [facility] [email or web address] 01/18/2021, 4:17 PM

## 2021-01-18 NOTE — Procedures (Signed)
Thoracentesis  Procedure Note  Corry Storie  [id number]  05/18/1956  Date:01/18/21  Time:9:21 AM   Provider Performing:Areebah Meinders W Heber    Procedure: Thoracentesis with imaging guidance (81859)  Indication(s) Pleural Effusion  Consent Risks of the procedure as well as the alternatives and risks of each were explained to the patient and/or caregiver.  Consent for the procedure was obtained and is signed in the bedside chart  Anesthesia Topical only with 1% lidocaine    Time Out Verified patient identification, verified procedure, site/side was marked, verified correct patient position, special equipment/implants available, medications/allergies/relevant history reviewed, required imaging and test results available.   Sterile Technique Maximal sterile technique including full sterile barrier drape, hand hygiene, sterile gown, sterile gloves, mask, hair covering, sterile ultrasound probe cover (if used).  Procedure Description Ultrasound was used to identify appropriate pleural anatomy for placement and overlying skin marked.  Area of drainage cleaned and draped in sterile fashion. Lidocaine was used to anesthetize the skin and subcutaneous tissue.  200 cc's of clear dark yellow appearing fluid was drained from the left pleural space. Catheter then removed and bandaid applied to site.   Complications/Tolerance None; patient tolerated the procedure well. Chest X-ray is ordered to confirm no post-procedural complication.   EBL Minimal   Specimen(s) Pleural fluid    Georgann Housekeeper, AGACNP-BC [facility]  See Amion for personal pager PCCM on call pager [phone] until 7pm. Please call Elink 7p-7a. [phone]  01/18/2021 9:22 AM

## 2021-01-18 NOTE — Consult Note (Signed)
[place]  [phone] Fax:(336) [phone]   MEDICAL ONCOLOGY - INITIAL CONSULTATION  Referral MD: Dr. Vance Gather  Reason for Referral: Left lower lobe lung mass with brain lesions  HPI: Loretta Woods is a 65 year old female with a past medical history significant for hypertension and hypothyroidism.  She presented to the emergency department with right-sided facial droop, right-sided headaches, right ear pain-she was sent by her PCP due to concern for possible CVA.  In the emergency department, her blood pressure was significantly elevated at 207/94 labs notable for a sodium of 129 and potassium of 3.2.  CT of the head without contrast showed extensive white matter edema in the right hemisphere with about 5 mm midline shift to the left and mass-effect on the right lateral ventricle, approximate 2.7 cm suspected mass in the right temporoparietal region concerning for metastatic focus or primary brain neoplasm.  MRI of the brain with and without contrast showed multiple enhancing masses within the brain consistent with metastatic disease, largest lesion in the right temporal lobe with severe surrounding edema and 4 mm of leftward midline shift.  She has been started on dexamethasone and Keppra.  Neurosurgery consult pending.  She had a CT of the chest/abdomen/pelvis with contrast which showed a 7.7 cm left lower lobe mass compatible with primary bronchogenic neoplasm with associated postobstructive left lower lobe atelectasis/collapse, and small loculated left pleural effusion.  There is postobstructive opacity versus perilymphatic spread of tumor in the central left upper lobe/perihilar region, 7 mm nodule in the medial left upper lobe which is indeterminate.  It was no evidence of metastatic disease in the abdomen or pelvis.  The patient was seen by pulmonology and underwent a left thoracentesis with 200 cc of fluid removed.  Pulmonology planning for bronchoscopy with biopsy on 7/8  at [facility].  The patient was seen in the emergency department today.  Her sister was at the bedside.  The patient reports that she has had improvement in her right-sided headache and right ear pain since admission.  She has been having right-sided headaches and right ear pain intermittently for about a month.  She still notes that she cannot smile like she used to and that her left eye is now opening and closing normally.  She tells me that she has had a decreased appetite and has lost about 7 to 8 pounds over the past few months.  She denies dizziness.  No recent fevers or chills.  She denies chest pain but reports that she feels like she cannot catch her breath at times.  She reports a nonproductive cough.  No hemoptysis.  Denies abdominal pain, nausea, vomiting, constipation, diarrhea.  No bleeding reported no lower extremity edema reported.  The patient is divorced and has no children.  She lives with her elderly mother.  Denies alcohol use in the past 10 years.  She previously smoked a pack of cigarettes per day times about 37 years.  She quit smoking cigarettes in 2012 but has continued to [place].  Family history significant for a father with bladder cancer.  Medical oncology was asked see the patient to make recommendations regarding her lung mass and brain lesions.   Past Medical History:  Diagnosis Date   Hypertension    Thyroid disease   :  History reviewed. No pertinent surgical history.:   Current Facility-Administered Medications  Medication Dose Route Frequency Provider Last Rate Last Admin   acetaminophen (TYLENOL) tablet 650 mg  650 mg Oral Q6H PRN  Shela Leff, MD       Or   acetaminophen (TYLENOL) suppository 650 mg  650 mg Rectal Q6H PRN Shela Leff, MD       atorvastatin (LIPITOR) tablet 10 mg  10 mg Oral Daily Shela Leff, MD   10 mg at 01/18/21 1015   cycloSPORINE (RESTASIS) 0.05 % ophthalmic emulsion 1 drop  1 drop Both Eyes BID Shela Leff, MD    1 drop at 01/18/21 1015   dexamethasone (DECADRON) tablet 6 mg  6 mg Oral Q6H Shela Leff, MD   6 mg at 01/18/21 1216   hydrALAZINE (APRESOLINE) injection 5 mg  5 mg Intravenous Q4H PRN Shela Leff, MD   5 mg at 01/18/21 1107   levETIRAcetam (KEPPRA) tablet 500 mg  500 mg Oral BID Shela Leff, MD   500 mg at 01/18/21 1015   levothyroxine (SYNTHROID) tablet 75 mcg  75 mcg Oral Daily Shela Leff, MD   75 mcg at 01/18/21 0525   olopatadine (PATANOL) 0.1 % ophthalmic solution 1 drop  1 drop Both Eyes Daily Shela Leff, MD         No Known Allergies:   Family History  Problem Relation Age of Onset   Bladder Cancer Father   :   Social History   Socioeconomic History   Marital status: Divorced    Spouse name: Not on file   Number of children: 0   Years of education: Not on file   Highest education level: Not on file  Occupational History   Not on file  Tobacco Use   Smoking status: Former    Packs/day: 1.00    Pack years: 0.00    Types: Cigarettes    Quit date: 2012    Years since quitting: 10.5   Smokeless tobacco: Never  Vaping Use   Vaping Use: Every day  Substance and Sexual Activity   Alcohol use: Never   Drug use: Never   Sexual activity: Not on file  Other Topics Concern   Not on file  Social History Narrative   Not on file   Social Determinants of Health   Financial Resource Strain: Not on file  Food Insecurity: Not on file  Transportation Needs: Not on file  Physical Activity: Not on file  Stress: Not on file  Social Connections: Not on file  Intimate Partner Violence: Not on file  :  Review of Systems: A comprehensive 14 point review of systems was negative except as noted in the HPI.  Exam: Patient Vitals for the past 24 hrs:  BP Temp Temp src Pulse Resp SpO2  01/18/21 1301 (!) 169/82 97.8 F (36.6 C) Oral 96 20 100 %  01/18/21 1200 (!) 164/74 -- -- 95 17 98 %  01/18/21 1107 (!) 191/94 -- -- -- -- --  01/18/21  0945 (!) 195/87 -- -- 94 17 98 %  01/18/21 0756 (!) 175/86 -- -- 90 16 97 %  01/18/21 0600 (!) 174/94 -- -- 82 18 100 %  01/18/21 0545 (!) 162/99 -- -- -- -- --  01/18/21 0530 (!) 171/81 -- -- -- -- --  01/18/21 0400 (!) 180/96 -- -- 83 17 100 %  01/18/21 0145 (!) 174/87 -- -- 74 16 99 %  01/17/21 1959 (!) 186/153 -- -- 75 18 99 %  01/17/21 1900 -- -- -- 78 -- 93 %  01/17/21 1845 -- -- -- 82 -- 92 %  01/17/21 1830 (!) 190/95 -- -- 80 -- 92 %  01/17/21 1815 -- -- -- 84 -- 96 %  01/17/21 1800 (!) 194/93 -- -- 90 -- 96 %  01/17/21 1746 (!) 207/94 98 F (36.7 C) Oral 95 18 97 %  01/17/21 1745 (!) 207/94 -- -- 91 -- 98 %    General: Awake and alert, resting on stretcher, no distress. Eyes: Eyebrow droop on the right, EOMI, PERRL, no scleral icterus.   ENT:  There were no oropharyngeal lesions.   Lymphatics:  Negative cervical, supraclavicular or axillary adenopathy.   Respiratory: lungs were clear bilaterally without wheezing or crackles.   Cardiovascular:  Regular rate and rhythm, S1/S2, without murmur, rub or gallop.  There was no pedal edema.   GI:  abdomen was soft, flat, nontender, nondistended, without organomegaly.   Musculoskeletal: Strength symmetrical in the upper and lower extremities.   Skin exam was without echymosis, petichae.   Neuro exam: Right facial droop. Patient was alert and oriented.  Attention was good.   Language was appropriate.  Mood was normal without depression.  Speech was not pressured.  Thought content was not tangential.     Lab Results  Component Value Date   WBC 9.7 01/17/2021   HGB 12.3 01/17/2021   HCT 36.6 01/17/2021   PLT 344 01/17/2021   GLUCOSE 133 (H) 01/18/2021   ALT 17 01/17/2021   AST 20 01/17/2021   NA 134 (L) 01/18/2021   K 4.4 01/18/2021   CL 95 (L) 01/18/2021   CREATININE 0.62 01/18/2021   BUN 13 01/18/2021   CO2 26 01/18/2021    CT Head Wo Contrast  Result Date: 01/17/2021 CLINICAL DATA:  Mental status change EXAM: CT HEAD  WITHOUT CONTRAST TECHNIQUE: Contiguous axial images were obtained from the base of the skull through the vertex without intravenous contrast. COMPARISON:  None. FINDINGS: Brain: Extensive hypodensity/edema within the right hemispheric white matter and basal ganglia. 2.7 x 1.9 by 2.3 cm suspected mass within the right temporoparietal region. About 5 mm midline shift to the left. Mass effect on the right lateral ventricle. Possible small chronic appearing infarct in the right cerebellum peripherally. Punctate cortical calcification at the left frontal lobe. Vascular: No hyperdense vessels.  No unexpected calcification. Skull: Normal. Negative for fracture or focal lesion. Sinuses/Orbits: No acute finding. Other: None. IMPRESSION: 1. Extensive white matter edema within the right hemisphere with about 5 mm midline shift to the left and mass effect on right lateral ventricle. Approximate 2.7 cm suspected mass in the right temporoparietal region concerning for metastatic focus or primary brain neoplasm. MRI with and without contrast is recommended for further evaluation. Critical Value/emergent results were called by telephone at the time of interpretation on 01/17/2021 at 6:50 pm to provider Jake in the ED, patient in triage , who verbally acknowledged these results. Electronically Signed   By: Donavan Foil M.D.   On: 01/17/2021 18:51   MR BRAIN W WO CONTRAST  Result Date: 01/17/2021 CLINICAL DATA:  Brain mass EXAM: MRI HEAD WITHOUT AND WITH CONTRAST TECHNIQUE: Multiplanar, multiecho pulse sequences of the brain and surrounding structures were obtained without and with intravenous contrast. CONTRAST:  5.31m GADAVIST GADOBUTROL 1 MMOL/ML IV SOLN COMPARISON:  Head CT 01/17/2021 FINDINGS: Brain: There are multiple enhancing masses within the brain. The largest lesion is in the right temporal lobe. There are solid and necrotic components of this mass. The solid component measures approximately 3.6 x 2.7 cm may reflect a  component of dural reaction. The necrotic portion measures approximately 4.0 x 2.0 cm. More  superior right temporal mass measures 2.0 x 1.8 cm. There are subcentimeter lesions in both occipital lobes, the left temporal lobe, right parietal lobe and the cerebellum. The lesions are annotated on series 16. There is severe edema within the right temporal and parietal lobes extending into the area of the right internal and external capsules. There is petechial hemorrhage associated with the anterior right temporal lesion. There is 4 mm of leftward midline shift. Vascular: Major flow voids are preserved. Skull and upper cervical spine: Normal calvarium and skull base. Visualized upper cervical spine and soft tissues are normal. Sinuses/Orbits:No paranasal sinus fluid levels or advanced mucosal thickening. Small amount of mastoid fluid. Normal orbits. IMPRESSION: Multiple enhancing masses within the brain, consistent with metastatic disease. The largest lesion is in the right temporal lobe with severe surrounding edema and 4 mm of leftward midline shift. Electronically Signed   By: Ulyses Jarred M.D.   On: 01/17/2021 23:31   CT CHEST ABDOMEN PELVIS W CONTRAST  Result Date: 01/17/2021 CLINICAL DATA:  Brain mass, staging EXAM: CT CHEST, ABDOMEN, AND PELVIS WITH CONTRAST TECHNIQUE: Multidetector CT imaging of the chest, abdomen and pelvis was performed following the standard protocol during bolus administration of intravenous contrast. CONTRAST:  163m OMNIPAQUE IOHEXOL 300 MG/ML  SOLN COMPARISON:  None. FINDINGS: CT CHEST FINDINGS Cardiovascular: The heart is normal in size. No pericardial effusion. No evidence of thoracic aortic aneurysm. Mild atherosclerotic calcifications of the aortic arch. Mild coronary atherosclerosis of the LAD and left circumflex. Mediastinum/Nodes: Thoracic lymphadenopathy, including a 16 mm short axis AP window node (series 2/image 27) and a 16 mm short axis left perihilar node (series 2/image  34). Lungs/Pleura: 7.4 x 5.5 cm posteromedial left lower lobe mass (series 2/image 39), compatible with primary bronchogenic neoplasm. Associated postobstructive atelectasis/collapse involving the left lower lobe. Small loculated left pleural effusion. Additional interlobular septal thickening in the central left upper lobe/perihilar region (series 4/image 56), likely post obstructive due to nodal metastases, although perilymphatic spread of tumor is not excluded. 7 mm nodule in the medial left upper lobe (series 4/image 100). Moderate centrilobular and paraseptal emphysematous changes, upper lung predominant. No focal consolidation. No pneumothorax. Musculoskeletal: No focal osseous lesions. CT ABDOMEN PELVIS FINDINGS Hepatobiliary: Liver is within normal limits. No suspicious/enhancing hepatic lesions. Gallbladder is unremarkable. No intrahepatic or extrahepatic ductal dilatation. Pancreas: Within normal limits. Spleen: Within normal limits. Adrenals/Urinary Tract: Adrenal glands are within normal limits. Kidneys are within normal limits.  No hydronephrosis. Bladder is within normal limits. Stomach/Bowel: Stomach is within normal limits. No evidence of bowel obstruction. Appendix is not discretely visualized. Vascular/Lymphatic: No evidence of abdominal aortic aneurysm. Atherosclerotic calcifications of the abdominal aorta and branch vessels. No suspicious abdominopelvic lymphadenopathy. Reproductive: Calcified uterine fibroids (series 2/image 101). Right ovary is notable for a 2.1 cm cystic lesion (series 2/image 97), likely benign. Left ovary is within normal limits. Other: No abdominopelvic ascites. Musculoskeletal: Mild degenerative changes of the lumbar spine. No focal osseous lesions. IMPRESSION: 7.5 cm left lower lobe mass, compatible with primary bronchogenic neoplasm. Associated postobstructive left lower lobe atelectasis/collapse. Small loculated left pleural effusion. Postobstructive opacity versus  perilymphatic spread of tumor in the central left upper lobe/perihilar region. 7 mm nodule in the medial left upper lobe, indeterminate, metastasis not excluded. Thoracic nodal metastases, as above. No evidence of metastatic disease in the abdomen/pelvis. Electronically Signed   By: SJulian HyM.D.   On: 01/17/2021 20:40   DG CHEST PORT 1 VIEW  Result Date: 01/18/2021 CLINICAL DATA:  Status post left-sided thoracentesis. EXAM: PORTABLE CHEST 1 VIEW COMPARISON:  CT chest from yesterday. FINDINGS: Normal heart size. Normal pulmonary vascularity. Resolved left pleural effusion. No pneumothorax. The lungs remain hyperinflated with emphysematous changes. Large mass in the medial left lower lobe grossly unchanged. Mild linear scarring in the lingula. The right lung is clear. No acute osseous abnormality. IMPRESSION: 1. Resolved left pleural effusion. No pneumothorax. 2. Unchanged left lower lobe mass. Electronically Signed   By: Titus Dubin M.D.   On: 01/18/2021 10:20     CT Head Wo Contrast  Result Date: 01/17/2021 CLINICAL DATA:  Mental status change EXAM: CT HEAD WITHOUT CONTRAST TECHNIQUE: Contiguous axial images were obtained from the base of the skull through the vertex without intravenous contrast. COMPARISON:  None. FINDINGS: Brain: Extensive hypodensity/edema within the right hemispheric white matter and basal ganglia. 2.7 x 1.9 by 2.3 cm suspected mass within the right temporoparietal region. About 5 mm midline shift to the left. Mass effect on the right lateral ventricle. Possible small chronic appearing infarct in the right cerebellum peripherally. Punctate cortical calcification at the left frontal lobe. Vascular: No hyperdense vessels.  No unexpected calcification. Skull: Normal. Negative for fracture or focal lesion. Sinuses/Orbits: No acute finding. Other: None. IMPRESSION: 1. Extensive white matter edema within the right hemisphere with about 5 mm midline shift to the left and mass  effect on right lateral ventricle. Approximate 2.7 cm suspected mass in the right temporoparietal region concerning for metastatic focus or primary brain neoplasm. MRI with and without contrast is recommended for further evaluation. Critical Value/emergent results were called by telephone at the time of interpretation on 01/17/2021 at 6:50 pm to provider Jake in the ED, patient in triage , who verbally acknowledged these results. Electronically Signed   By: Donavan Foil M.D.   On: 01/17/2021 18:51   MR BRAIN W WO CONTRAST  Result Date: 01/17/2021 CLINICAL DATA:  Brain mass EXAM: MRI HEAD WITHOUT AND WITH CONTRAST TECHNIQUE: Multiplanar, multiecho pulse sequences of the brain and surrounding structures were obtained without and with intravenous contrast. CONTRAST:  5.79m GADAVIST GADOBUTROL 1 MMOL/ML IV SOLN COMPARISON:  Head CT 01/17/2021 FINDINGS: Brain: There are multiple enhancing masses within the brain. The largest lesion is in the right temporal lobe. There are solid and necrotic components of this mass. The solid component measures approximately 3.6 x 2.7 cm may reflect a component of dural reaction. The necrotic portion measures approximately 4.0 x 2.0 cm. More superior right temporal mass measures 2.0 x 1.8 cm. There are subcentimeter lesions in both occipital lobes, the left temporal lobe, right parietal lobe and the cerebellum. The lesions are annotated on series 16. There is severe edema within the right temporal and parietal lobes extending into the area of the right internal and external capsules. There is petechial hemorrhage associated with the anterior right temporal lesion. There is 4 mm of leftward midline shift. Vascular: Major flow voids are preserved. Skull and upper cervical spine: Normal calvarium and skull base. Visualized upper cervical spine and soft tissues are normal. Sinuses/Orbits:No paranasal sinus fluid levels or advanced mucosal thickening. Small amount of mastoid fluid. Normal  orbits. IMPRESSION: Multiple enhancing masses within the brain, consistent with metastatic disease. The largest lesion is in the right temporal lobe with severe surrounding edema and 4 mm of leftward midline shift. Electronically Signed   By: KUlyses JarredM.D.   On: 01/17/2021 23:31   CT CHEST ABDOMEN PELVIS W CONTRAST  Result Date: 01/17/2021 CLINICAL DATA:  Brain mass, staging EXAM: CT CHEST, ABDOMEN, AND PELVIS WITH CONTRAST TECHNIQUE: Multidetector CT imaging of the chest, abdomen and pelvis was performed following the standard protocol during bolus administration of intravenous contrast. CONTRAST:  122m OMNIPAQUE IOHEXOL 300 MG/ML  SOLN COMPARISON:  None. FINDINGS: CT CHEST FINDINGS Cardiovascular: The heart is normal in size. No pericardial effusion. No evidence of thoracic aortic aneurysm. Mild atherosclerotic calcifications of the aortic arch. Mild coronary atherosclerosis of the LAD and left circumflex. Mediastinum/Nodes: Thoracic lymphadenopathy, including a 16 mm short axis AP window node (series 2/image 27) and a 16 mm short axis left perihilar node (series 2/image 34). Lungs/Pleura: 7.4 x 5.5 cm posteromedial left lower lobe mass (series 2/image 39), compatible with primary bronchogenic neoplasm. Associated postobstructive atelectasis/collapse involving the left lower lobe. Small loculated left pleural effusion. Additional interlobular septal thickening in the central left upper lobe/perihilar region (series 4/image 56), likely post obstructive due to nodal metastases, although perilymphatic spread of tumor is not excluded. 7 mm nodule in the medial left upper lobe (series 4/image 100). Moderate centrilobular and paraseptal emphysematous changes, upper lung predominant. No focal consolidation. No pneumothorax. Musculoskeletal: No focal osseous lesions. CT ABDOMEN PELVIS FINDINGS Hepatobiliary: Liver is within normal limits. No suspicious/enhancing hepatic lesions. Gallbladder is unremarkable. No  intrahepatic or extrahepatic ductal dilatation. Pancreas: Within normal limits. Spleen: Within normal limits. Adrenals/Urinary Tract: Adrenal glands are within normal limits. Kidneys are within normal limits.  No hydronephrosis. Bladder is within normal limits. Stomach/Bowel: Stomach is within normal limits. No evidence of bowel obstruction. Appendix is not discretely visualized. Vascular/Lymphatic: No evidence of abdominal aortic aneurysm. Atherosclerotic calcifications of the abdominal aorta and branch vessels. No suspicious abdominopelvic lymphadenopathy. Reproductive: Calcified uterine fibroids (series 2/image 101). Right ovary is notable for a 2.1 cm cystic lesion (series 2/image 97), likely benign. Left ovary is within normal limits. Other: No abdominopelvic ascites. Musculoskeletal: Mild degenerative changes of the lumbar spine. No focal osseous lesions. IMPRESSION: 7.5 cm left lower lobe mass, compatible with primary bronchogenic neoplasm. Associated postobstructive left lower lobe atelectasis/collapse. Small loculated left pleural effusion. Postobstructive opacity versus perilymphatic spread of tumor in the central left upper lobe/perihilar region. 7 mm nodule in the medial left upper lobe, indeterminate, metastasis not excluded. Thoracic nodal metastases, as above. No evidence of metastatic disease in the abdomen/pelvis. Electronically Signed   By: SJulian HyM.D.   On: 01/17/2021 20:40   DG CHEST PORT 1 VIEW  Result Date: 01/18/2021 CLINICAL DATA:  Status post left-sided thoracentesis. EXAM: PORTABLE CHEST 1 VIEW COMPARISON:  CT chest from yesterday. FINDINGS: Normal heart size. Normal pulmonary vascularity. Resolved left pleural effusion. No pneumothorax. The lungs remain hyperinflated with emphysematous changes. Large mass in the medial left lower lobe grossly unchanged. Mild linear scarring in the lingula. The right lung is clear. No acute osseous abnormality. IMPRESSION: 1. Resolved left  pleural effusion. No pneumothorax. 2. Unchanged left lower lobe mass. Electronically Signed   By: WTitus DubinM.D.   On: 01/18/2021 10:20    Assessment and Plan:   Ms. GMacfarlaneis a 65year old female with a past medical history significant for hypertension and hypothyroidism.  She has about a 1 month history of intermittent right-sided headaches and right ear pain with subsequent right facial droop.  She is also had recent anorexia and weight loss.  Imaging completed on admission concerning for multiple brain lesions consistent metastatic disease as well as a left lower lobe lung mass.  Discussed that findings are concerning for metastatic lung cancer, but  that we need a biopsy to confirm the diagnosis and to gain more information about her malignancy.  She has been started on dexamethasone and Keppra for her brain mets.  Neurosurgery consultation pending.  She has also been seen by pulmonology and underwent left thoracentesis earlier today.  Cytology pending.  Pulmonology planning for bronchoscopy on 7/8.  ## Lung mass with brain lesions --Imaging findings have been discussed with the patient and are concerning for metastatic lung cancer. --Neurosurgery consultation pending. --Continue dexamethasone for vasogenic edema and continue Keppra for seizure prophylaxis. --If no surgery planned, will need radiation oncology consultation. --Will await results from biopsy from bronchoscopy to confirm the diagnosis.  Depending on the diagnosis, will likely send for additional studies such as PD-L1 and NGS. --Further recommendations regarding treatment pending biopsy results.  Thank you for this referral.   Mikey Bussing, DNP, AGPCNP-BC, AOCNP

## 2021-01-18 NOTE — Consult Note (Signed)
Reason for Consult:cerebral masses Referring Physician: ED  Loretta Woods is an 65 y.o. female.  HPI: who presented to her PCP with a right facial droop, right ptosis, headaches and some neck pain. 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 Head CT yesterday revealed a lesion in the left temporal lobe. I recommended a Chest, Abdomen, and pelvic CT be performed. That scan revealed a large left lower lobe mass. MRI today revealed multiple intracerebral masses. Thoracentesis done today, biopsy of lung mass scheduled for tomorrow.   Past Medical History:  Diagnosis Date   Hypertension    Thyroid disease     History reviewed. No pertinent surgical history.  Family History  Problem Relation Age of Onset   Bladder Cancer Father     Social History:  reports that she quit smoking about 10 years ago. Her smoking use included cigarettes. She smoked an average of 1.00 packs per day. She has never used smokeless tobacco. She reports that she does not drink alcohol and does not use drugs.  Allergies: No Known Allergies  Medications: I have reviewed the patient's current medications.  Results for orders placed or performed during the hospital encounter of 01/17/21 (from the past 48 hour(s))  CBC with Differential     Status: None   Collection Time: 01/17/21  6:03 PM  Result Value Ref Range   WBC 9.7 4.0 - 10.5 K/uL   RBC 4.24 3.87 - 5.11 MIL/uL   Hemoglobin 12.3 12.0 - 15.0 g/dL   HCT 36.6 36.0 - 46.0 %   MCV 86.3 80.0 - 100.0 fL   MCH 29.0 26.0 - 34.0 pg   MCHC 33.6 30.0 - 36.0 g/dL   RDW 13.0 11.5 - 15.5 %   Platelets 344 150 - 400 K/uL   nRBC 0.0 0.0 - 0.2 %   Neutrophils Relative % 78 %   Neutro Abs 7.4 1.7 - 7.7 K/uL   Lymphocytes Relative 11 %   Lymphs Abs 1.1 0.7 - 4.0 K/uL   Monocytes Relative 9 %   Monocytes Absolute 0.9 0.1 - 1.0 K/uL   Eosinophils Relative 2 %   Eosinophils Absolute 0.2 0.0 - 0.5 K/uL   Basophils Relative 0 %   Basophils Absolute 0.0 0.0 -  0.1 K/uL   Immature Granulocytes 0 %   Abs Immature Granulocytes 0.04 0.00 - 0.07 K/uL    Comment: Performed at [facility], [place] [address], [place], [place] [address]  Comprehensive metabolic panel     Status: Abnormal   Collection Time: 01/17/21  6:03 PM  Result Value Ref Range   Sodium 129 (L) 135 - 145 mmol/L   Potassium 3.2 (L) 3.5 - 5.1 mmol/L   Chloride 93 (L) 98 - 111 mmol/L   CO2 25 22 - 32 mmol/L   Glucose, Bld 103 (H) 70 - 99 mg/dL    Comment: Glucose reference range applies only to samples taken after fasting for at least 8 hours.   BUN 14 8 - 23 mg/dL   Creatinine, Ser 0.56 0.44 - 1.00 mg/dL   Calcium 9.1 8.9 - 10.3 mg/dL   Total Protein 7.3 6.5 - 8.1 g/dL   Albumin 4.2 3.5 - 5.0 g/dL   AST 20 15 - 41 U/L   ALT 17 0 - 44 U/L   Alkaline Phosphatase 83 38 - 126 U/L   Total Bilirubin 0.7 0.3 - 1.2 mg/dL   GFR, Estimated >60 >60 mL/min    Comment: (NOTE)  Calculated using the CKD-EPI Creatinine Equation (2021)    Anion gap 11 5 - 15    Comment: Performed at [facility], [place] [address] [place], [place] [address]  Protime-INR     Status: None   Collection Time: 01/17/21  6:03 PM  Result Value Ref Range   Prothrombin Time 12.8 11.4 - 15.2 seconds   INR 1.0 0.8 - 1.2    Comment: (NOTE) INR goal varies based on device and disease states. Performed at [facility], [place] [address] [place], [place] [address]   Resp Panel by RT-PCR (Flu A&B, Covid) Nasopharyngeal Swab     Status: None   Collection Time: 01/17/21  7:15 PM   Specimen: Nasopharyngeal Swab; Nasopharyngeal(NP) swabs in vial transport medium  Result Value Ref Range   SARS Coronavirus 2 by RT PCR NEGATIVE NEGATIVE    Comment: (NOTE) SARS-CoV-2 target nucleic acids are NOT DETECTED.  The SARS-CoV-2 RNA is generally detectable in upper respiratory specimens during the acute phase of infection. The lowest concentration of SARS-CoV-2 viral copies this  assay can detect is 138 copies/mL. A negative result does not preclude SARS-Cov-2 infection and should not be used as the sole basis for treatment or other patient management decisions. A negative result may occur with  improper specimen collection/handling, submission of specimen other than nasopharyngeal swab, presence of viral mutation(s) within the areas targeted by this assay, and inadequate number of viral copies(<138 copies/mL). A negative result must be combined with clinical observations, patient history, and epidemiological information. The expected result is Negative.  Fact Sheet for Patients:  EntrepreneurPulse.com.au  Fact Sheet for Healthcare Providers:  IncredibleEmployment.be  This test is no t yet approved or cleared by the [place] FDA and  has been authorized for detection and/or diagnosis of SARS-CoV-2 by FDA under an Emergency Use Authorization (EUA). This EUA will remain  in effect (meaning this test can be used) for the duration of the COVID-19 declaration under Section 564(b)(1) of the Act, 21 U.S.C.section 360bbb-3(b)(1), unless the authorization is terminated  or revoked sooner.       Influenza A by PCR NEGATIVE NEGATIVE   Influenza B by PCR NEGATIVE NEGATIVE    Comment: (NOTE) The Xpert Xpress SARS-CoV-2/FLU/RSV plus assay is intended as an aid in the diagnosis of influenza from Nasopharyngeal swab specimens and should not be used as a sole basis for treatment. Nasal washings and aspirates are unacceptable for Xpert Xpress SARS-CoV-2/FLU/RSV testing.  Fact Sheet for Patients: EntrepreneurPulse.com.au  Fact Sheet for Healthcare Providers: IncredibleEmployment.be  This test is not yet approved or cleared by the [place] FDA and has been authorized for detection and/or diagnosis of SARS-CoV-2 by FDA under an Emergency Use Authorization (EUA). This EUA will remain in  effect (meaning this test can be used) for the duration of the COVID-19 declaration under Section 564(b)(1) of the Act, 21 U.S.C. section 360bbb-3(b)(1), unless the authorization is terminated or revoked.  Performed at [facility], [place] [address], [place], [place] [address]   Basic metabolic panel     Status: Abnormal   Collection Time: 01/18/21  5:04 AM  Result Value Ref Range   Sodium 134 (L) 135 - 145 mmol/L   Potassium 4.4 3.5 - 5.1 mmol/L    Comment: DELTA CHECK NOTED   Chloride 95 (L) 98 - 111 mmol/L   CO2 26 22 - 32 mmol/L   Glucose, Bld 133 (H) 70 - 99 mg/dL    Comment: Glucose  reference range applies only to samples taken after fasting for at least 8 hours.   BUN 13 8 - 23 mg/dL   Creatinine, Ser 0.62 0.44 - 1.00 mg/dL   Calcium 10.2 8.9 - 10.3 mg/dL   GFR, Estimated >60 >60 mL/min    Comment: (NOTE) Calculated using the CKD-EPI Creatinine Equation (2021)    Anion gap 13 5 - 15    Comment: Performed at [facility], [place] [address] [place], [place] [address]  Magnesium     Status: None   Collection Time: 01/18/21  5:04 AM  Result Value Ref Range   Magnesium 2.3 1.7 - 2.4 mg/dL    Comment: Performed at [facility], [place] [address], [place], [place] [address]  TSH     Status: None   Collection Time: 01/18/21  5:04 AM  Result Value Ref Range   TSH 2.937 0.350 - 4.500 uIU/mL    Comment: Performed by a 3rd Generation assay with a functional sensitivity of <=0.01 uIU/mL. Performed at [facility], [place] [address] [place], [place] [address]   HIV Antibody (routine testing w rflx)     Status: None   Collection Time: 01/18/21  6:08 AM  Result Value Ref Range   HIV Screen 4th Generation wRfx Non Reactive Non Reactive    Comment: Performed at [facility], [place] [address], [place], [place] [address]  Osmolality     Status: None   Collection Time: 01/18/21  6:08 AM  Result Value Ref Range    Osmolality 285 275 - 295 mOsm/kg    Comment: Performed at [place] [address] [place], [place] [address]  Body fluid cell count with differential     Status: Abnormal   Collection Time: 01/18/21  9:12 AM  Result Value Ref Range   Fluid Type-FCT PLEURAL     Comment: L   Color, Fluid AMBER (A) YELLOW   Appearance, Fluid CLOUDY (A) CLEAR   Total Nucleated Cell Count, Fluid 1,557 (H) 0 - 1,000 cu mm   Neutrophil Count, Fluid 7 0 - 25 %   Lymphs, Fluid 44 %   Monocyte-Macrophage-Serous Fluid 49 (L) 50 - 90 %    Comment: OTHER CELLS IDENTIFIED AS MESOTHELIAL CELLS   Eos, Fluid 0 %    Comment: Performed at [facility], [place] [address], [place], [place] [address]  Lactate dehydrogenase (pleural or peritoneal fluid)     Status: Abnormal   Collection Time: 01/18/21  9:12 AM  Result Value Ref Range   LD, Fluid 216 (H) 3 - 23 U/L    Comment: (NOTE) Results should be evaluated in conjunction with serum values    Fluid Type-FLDH Pleural, L     Comment: Performed at [facility], [place] [address] [place], [place] [address]  Protein, pleural or peritoneal fluid     Status: None   Collection Time: 01/18/21  9:12 AM  Result Value Ref Range   Total protein, fluid 3.9 g/dL    Comment: (NOTE) No normal range established for this test Results should be evaluated in conjunction with serum values    Fluid Type-FTP Pleural, L     Comment: Performed at [facility], [place] [address], [place], [place] [address]  Body fluid culture w Gram Stain     Status: None (Preliminary result)   Collection Time: 01/18/21  9:16 AM   Specimen: Pleura; Body Fluid  Result Value  Ref Range   Specimen Description      PLEURAL LT Performed at [place] [address], [place], [place] [address]    Special Requests      NONE Performed at [facility], [place] [address] [place], [place] [address]    Gram Stain       FEW WBC PRESENT, PREDOMINANTLY MONONUCLEAR NO ORGANISMS SEEN Performed at [facility], [place] [address] [place], [place] [address]    Culture PENDING    Report Status PENDING     CT Head Wo Contrast  Result Date: 01/17/2021 CLINICAL DATA:  Mental status change EXAM: CT HEAD WITHOUT CONTRAST TECHNIQUE: Contiguous axial images were obtained from the base of the skull through the vertex without intravenous contrast. COMPARISON:  None. FINDINGS: Brain: Extensive hypodensity/edema within the right hemispheric white matter and basal ganglia. 2.7 x 1.9 by 2.3 cm suspected mass within the right temporoparietal region. About 5 mm midline shift to the left. Mass effect on the right lateral ventricle. Possible small chronic appearing infarct in the right cerebellum peripherally. Punctate cortical calcification at the left frontal lobe. Vascular: No hyperdense vessels.  No unexpected calcification. Skull: Normal. Negative for fracture or focal lesion. Sinuses/Orbits: No acute finding. Other: None. IMPRESSION: 1. Extensive white matter edema within the right hemisphere with about 5 mm midline shift to the left and mass effect on right lateral ventricle. Approximate 2.7 cm suspected mass in the right temporoparietal region concerning for metastatic focus or primary brain neoplasm. MRI with and without contrast is recommended for further evaluation. Critical Value/emergent results were called by telephone at the time of interpretation on 01/17/2021 at 6:50 pm to provider Jake in the ED, patient in triage , who verbally acknowledged these results. Electronically Signed   By: Donavan Foil M.D.   On: 01/17/2021 18:51   MR BRAIN W WO CONTRAST  Result Date: 01/17/2021 CLINICAL DATA:  Brain mass EXAM: MRI HEAD WITHOUT AND WITH CONTRAST TECHNIQUE: Multiplanar, multiecho pulse sequences of the brain and surrounding structures were obtained without and with intravenous contrast. CONTRAST:  5.22mL GADAVIST GADOBUTROL 1  MMOL/ML IV SOLN COMPARISON:  Head CT 01/17/2021 FINDINGS: Brain: There are multiple enhancing masses within the brain. The largest lesion is in the right temporal lobe. There are solid and necrotic components of this mass. The solid component measures approximately 3.6 x 2.7 cm may reflect a component of dural reaction. The necrotic portion measures approximately 4.0 x 2.0 cm. More superior right temporal mass measures 2.0 x 1.8 cm. There are subcentimeter lesions in both occipital lobes, the left temporal lobe, right parietal lobe and the cerebellum. The lesions are annotated on series 16. There is severe edema within the right temporal and parietal lobes extending into the area of the right internal and external capsules. There is petechial hemorrhage associated with the anterior right temporal lesion. There is 4 mm of leftward midline shift. Vascular: Major flow voids are preserved. Skull and upper cervical spine: Normal calvarium and skull base. Visualized upper cervical spine and soft tissues are normal. Sinuses/Orbits:No paranasal sinus fluid levels or advanced mucosal thickening. Small amount of mastoid fluid. Normal orbits. IMPRESSION: Multiple enhancing masses within the brain, consistent with metastatic disease. The largest lesion is in the right temporal lobe with severe surrounding edema and 4 mm of leftward midline shift. Electronically Signed   By: Ulyses Jarred M.D.   On: 01/17/2021 23:31   CT CHEST ABDOMEN PELVIS W CONTRAST  Result Date:  01/17/2021 CLINICAL DATA:  Brain mass, staging EXAM: CT CHEST, ABDOMEN, AND PELVIS WITH CONTRAST TECHNIQUE: Multidetector CT imaging of the chest, abdomen and pelvis was performed following the standard protocol during bolus administration of intravenous contrast. CONTRAST:  179mL OMNIPAQUE IOHEXOL 300 MG/ML  SOLN COMPARISON:  None. FINDINGS: CT CHEST FINDINGS Cardiovascular: The heart is normal in size. No pericardial effusion. No evidence of thoracic aortic  aneurysm. Mild atherosclerotic calcifications of the aortic arch. Mild coronary atherosclerosis of the LAD and left circumflex. Mediastinum/Nodes: Thoracic lymphadenopathy, including a 16 mm short axis AP window node (series 2/image 27) and a 16 mm short axis left perihilar node (series 2/image 34). Lungs/Pleura: 7.4 x 5.5 cm posteromedial left lower lobe mass (series 2/image 39), compatible with primary bronchogenic neoplasm. Associated postobstructive atelectasis/collapse involving the left lower lobe. Small loculated left pleural effusion. Additional interlobular septal thickening in the central left upper lobe/perihilar region (series 4/image 56), likely post obstructive due to nodal metastases, although perilymphatic spread of tumor is not excluded. 7 mm nodule in the medial left upper lobe (series 4/image 100). Moderate centrilobular and paraseptal emphysematous changes, upper lung predominant. No focal consolidation. No pneumothorax. Musculoskeletal: No focal osseous lesions. CT ABDOMEN PELVIS FINDINGS Hepatobiliary: Liver is within normal limits. No suspicious/enhancing hepatic lesions. Gallbladder is unremarkable. No intrahepatic or extrahepatic ductal dilatation. Pancreas: Within normal limits. Spleen: Within normal limits. Adrenals/Urinary Tract: Adrenal glands are within normal limits. Kidneys are within normal limits.  No hydronephrosis. Bladder is within normal limits. Stomach/Bowel: Stomach is within normal limits. No evidence of bowel obstruction. Appendix is not discretely visualized. Vascular/Lymphatic: No evidence of abdominal aortic aneurysm. Atherosclerotic calcifications of the abdominal aorta and branch vessels. No suspicious abdominopelvic lymphadenopathy. Reproductive: Calcified uterine fibroids (series 2/image 101). Right ovary is notable for a 2.1 cm cystic lesion (series 2/image 97), likely benign. Left ovary is within normal limits. Other: No abdominopelvic ascites. Musculoskeletal: Mild  degenerative changes of the lumbar spine. No focal osseous lesions. IMPRESSION: 7.5 cm left lower lobe mass, compatible with primary bronchogenic neoplasm. Associated postobstructive left lower lobe atelectasis/collapse. Small loculated left pleural effusion. Postobstructive opacity versus perilymphatic spread of tumor in the central left upper lobe/perihilar region. 7 mm nodule in the medial left upper lobe, indeterminate, metastasis not excluded. Thoracic nodal metastases, as above. No evidence of metastatic disease in the abdomen/pelvis. Electronically Signed   By: Julian Hy M.D.   On: 01/17/2021 20:40   DG CHEST PORT 1 VIEW  Result Date: 01/18/2021 CLINICAL DATA:  Status post left-sided thoracentesis. EXAM: PORTABLE CHEST 1 VIEW COMPARISON:  CT chest from yesterday. FINDINGS: Normal heart size. Normal pulmonary vascularity. Resolved left pleural effusion. No pneumothorax. The lungs remain hyperinflated with emphysematous changes. Large mass in the medial left lower lobe grossly unchanged. Mild linear scarring in the lingula. The right lung is clear. No acute osseous abnormality. IMPRESSION: 1. Resolved left pleural effusion. No pneumothorax. 2. Unchanged left lower lobe mass. Electronically Signed   By: Titus Dubin M.D.   On: 01/18/2021 10:20    Review of Systems  Constitutional: Negative.   HENT:         Right facial droop, right ptosis  Eyes: Negative.   Respiratory: Negative.    Cardiovascular: Negative.   Gastrointestinal: Negative.   Musculoskeletal:  Positive for neck pain.  Allergic/Immunologic: Negative.   Neurological:  Positive for headaches.  Hematological: Negative.   Psychiatric/Behavioral: Negative.    Blood pressure (!) 165/85, pulse 78, temperature 97.8 F (36.6 C), temperature source Oral, resp.  rate 20, height 5\' 2"  (1.575 m), weight 44 kg, SpO2 99 %. Physical Exam Constitutional:      General: She is not in acute distress.    Appearance: Normal appearance.  She is normal weight. She is not ill-appearing.  HENT:     Head: Normocephalic and atraumatic.     Nose: Nose normal.     Mouth/Throat:     Mouth: Mucous membranes are moist.     Pharynx: Oropharynx is clear.  Eyes:     Extraocular Movements: Extraocular movements intact.     Conjunctiva/sclera: Conjunctivae normal.     Pupils: Pupils are equal, round, and reactive to light.  Cardiovascular:     Rate and Rhythm: Normal rate and regular rhythm.  Pulmonary:     Effort: Pulmonary effort is normal.  Abdominal:     General: Abdomen is flat.  Musculoskeletal:        General: Normal range of motion.     Cervical back: Normal range of motion and neck supple.  Skin:    General: Skin is warm and dry.  Neurological:     Mental Status: She is alert and oriented to person, place, and time.     Sensory: Sensation is intact.     Motor: Motor function is intact.     Coordination: Coordination is intact.     Deep Tendon Reflexes: Babinski sign absent on the right side. Babinski sign absent on the left side.     Reflex Scores:      Tricep reflexes are 2+ on the right side and 2+ on the left side.      Bicep reflexes are 2+ on the right side and 2+ on the left side.      Brachioradialis reflexes are 2+ on the right side and 2+ on the left side.      Patellar reflexes are 2+ on the right side and 2+ on the left side.      Achilles reflexes are 2+ on the right side and 2+ on the left side.    Comments: Flattening of right nasolabial fold, mild asymmetry with smile, right droop No drift Proprioception intact Full strength all extremities Mild ptosis right eye Did not detect a visual field cut, strongly suspect one is present. Unable to close right eyes without closing left eyes.      Assessment/Plan: MRI shows multiple cranial lesions. The facial droop which brought her to the physician not fully supported by the MRI. There has also been significant improvement since admission with steroids on  board.  Chest CT revealed large mass in the left lower mass. Thoracentesis performed today, lung biopsy tomorrow. Not clear until pathology is back whether there is a neurosurgical role to play. Continue decadron, and Keppra. Will await biopsy results and speak with radiation oncology/oncology at that time.  Ashok Pall 01/18/2021, 7:37 PM

## 2021-01-19 ENCOUNTER — Encounter (HOSPITAL_COMMUNITY)
Admission: EM | Disposition: A | Discharge status: Home / Self Care | Payer: Self-pay | Source: Home / Self Care | Attending: Family Medicine

## 2021-01-19 ENCOUNTER — Inpatient Hospital Stay (HOSPITAL_COMMUNITY): Payer: 59 | Admitting: Anesthesiology

## 2021-01-19 ENCOUNTER — Encounter (HOSPITAL_COMMUNITY): Payer: Self-pay | Admitting: Internal Medicine

## 2021-01-19 DIAGNOSIS — C3492 Malignant neoplasm of unspecified part of left bronchus or lung: Secondary | ICD-10-CM

## 2021-01-19 HISTORY — PX: VIDEO BRONCHOSCOPY: SHX5072

## 2021-01-19 HISTORY — PX: HEMOSTASIS CONTROL: SHX6838

## 2021-01-19 HISTORY — PX: CRYOTHERAPY: SHX6894

## 2021-01-19 LAB — CYTOLOGY - NON PAP

## 2021-01-19 SURGERY — HEMOSTASIS CONTROL
Anesthesia: General

## 2021-01-19 MED ORDER — SUGAMMADEX SODIUM 200 MG/2ML IV SOLN
INTRAVENOUS | Status: DC | PRN
  Administered 2021-01-19: 100 mg via INTRAVENOUS

## 2021-01-19 MED ORDER — DEXAMETHASONE SODIUM PHOSPHATE 10 MG/ML IJ SOLN
INTRAMUSCULAR | Status: DC | PRN
  Administered 2021-01-19: 5 mg via INTRAVENOUS

## 2021-01-19 MED ORDER — MIDAZOLAM HCL 2 MG/2ML IJ SOLN
INTRAMUSCULAR | Status: DC | PRN
  Administered 2021-01-19: 2 mg via INTRAVENOUS

## 2021-01-19 MED ORDER — ROCURONIUM BROMIDE 10 MG/ML (PF) SYRINGE
PREFILLED_SYRINGE | INTRAVENOUS | Status: DC | PRN
  Administered 2021-01-19: 30 mg via INTRAVENOUS
  Administered 2021-01-19: 10 mg via INTRAVENOUS

## 2021-01-19 MED ORDER — FENTANYL CITRATE (PF) 250 MCG/5ML IJ SOLN
INTRAMUSCULAR | Status: DC | PRN
  Administered 2021-01-19 (×2): 50 ug via INTRAVENOUS

## 2021-01-19 MED ORDER — SODIUM CHLORIDE (PF) 0.9 % IJ SOLN
PREFILLED_SYRINGE | INTRAMUSCULAR | Status: DC | PRN
  Administered 2021-01-19 (×2): 4 mL

## 2021-01-19 MED ORDER — PROPOFOL 10 MG/ML IV BOLUS
INTRAVENOUS | Status: DC | PRN
  Administered 2021-01-19: 80 mg via INTRAVENOUS

## 2021-01-19 MED ORDER — ONDANSETRON HCL 4 MG/2ML IJ SOLN
INTRAMUSCULAR | Status: DC | PRN
  Administered 2021-01-19: 4 mg via INTRAVENOUS

## 2021-01-19 MED ORDER — LIDOCAINE 2% (20 MG/ML) 5 ML SYRINGE
INTRAMUSCULAR | Status: DC | PRN
  Administered 2021-01-19: 40 mg via INTRAVENOUS

## 2021-01-19 MED ORDER — LEVETIRACETAM 500 MG PO TABS: 500.0000 mg | ORAL_TABLET | Freq: Two times a day (BID) | ORAL | 0 refills | Status: AC

## 2021-01-19 MED ORDER — DEXAMETHASONE 6 MG PO TABS: 6.0000 mg | ORAL_TABLET | Freq: Four times a day (QID) | ORAL | 0 refills | Status: DC

## 2021-01-19 MED ORDER — LACTATED RINGERS IV SOLN: INTRAVENOUS | Status: DC

## 2021-01-19 MED ORDER — PHENYLEPHRINE 40 MCG/ML (10ML) SYRINGE FOR IV PUSH (FOR BLOOD PRESSURE SUPPORT)
PREFILLED_SYRINGE | INTRAVENOUS | Status: DC | PRN
  Administered 2021-01-19: 120 ug via INTRAVENOUS

## 2021-01-19 MED ORDER — CHLORHEXIDINE GLUCONATE 0.12 % MT SOLN
OROMUCOSAL | Status: AC
  Administered 2021-01-19: 15 mL via OROMUCOSAL
  Filled 2021-01-19: qty 15

## 2021-01-19 MED ORDER — CHLORHEXIDINE GLUCONATE 0.12 % MT SOLN
15.0000 mL | Freq: Once | OROMUCOSAL | Status: AC
  Filled 2021-01-19: qty 15

## 2021-01-19 SURGICAL SUPPLY — 30 items
BRUSH CYTOL CELLEBRITY 1.5X140 (MISCELLANEOUS) IMPLANT
CANISTER SUCT 3000ML PPV (MISCELLANEOUS) ×3 IMPLANT
CONT SPEC 4OZ CLIKSEAL STRL BL (MISCELLANEOUS) ×3 IMPLANT
COVER BACK TABLE 60X90IN (DRAPES) ×3 IMPLANT
COVER DOME SNAP 22 D (MISCELLANEOUS) ×3 IMPLANT
FORCEPS BIOP RJ4 1.8 (CUTTING FORCEPS) IMPLANT
GAUZE SPONGE 4X4 12PLY STRL (GAUZE/BANDAGES/DRESSINGS) ×3 IMPLANT
GLOVE BIO SURGEON STRL SZ7.5 (GLOVE) ×3 IMPLANT
GOWN STRL REUS W/ TWL LRG LVL3 (GOWN DISPOSABLE) ×2 IMPLANT
GOWN STRL REUS W/TWL LRG LVL3 (GOWN DISPOSABLE) ×1
KIT CLEAN ENDO COMPLIANCE (KITS) ×6 IMPLANT
KIT TURNOVER KIT B (KITS) ×3 IMPLANT
MARKER SKIN DUAL TIP RULER LAB (MISCELLANEOUS) ×3 IMPLANT
NDL EBUS SONO TIP PENTAX (NEEDLE) ×1 IMPLANT
NEEDLE EBUS SONO TIP PENTAX (NEEDLE) ×3 IMPLANT
NS IRRIG 1000ML POUR BTL (IV SOLUTION) ×3 IMPLANT
OIL SILICONE PENTAX (PARTS (SERVICE/REPAIRS)) ×3 IMPLANT
PAD ARMBOARD 7.5X6 YLW CONV (MISCELLANEOUS) ×6 IMPLANT
SOL ANTI FOG 6CC (MISCELLANEOUS) ×2 IMPLANT
SOLUTION ANTI FOG 6CC (MISCELLANEOUS) ×1
SYR 20CC LL (SYRINGE) ×6 IMPLANT
SYR 20ML ECCENTRIC (SYRINGE) ×6 IMPLANT
SYR 50ML SLIP (SYRINGE) IMPLANT
SYR 5ML LUER SLIP (SYRINGE) ×3 IMPLANT
TOWEL OR 17X24 6PK STRL BLUE (TOWEL DISPOSABLE) ×3 IMPLANT
TRAP SPECIMEN MUCOUS 40CC (MISCELLANEOUS) IMPLANT
TUBE CONNECTING 20X1/4 (TUBING) ×6 IMPLANT
UNDERPAD 30X30 (UNDERPADS AND DIAPERS) ×3 IMPLANT
VALVE DISPOSABLE (MISCELLANEOUS) ×3 IMPLANT
WATER STERILE IRR 1000ML POUR (IV SOLUTION) ×3 IMPLANT

## 2021-01-19 NOTE — Op Note (Addendum)
Video Bronchoscopy, endobronchial cryotherapy, endobronchial cryo biopsies, tumor debulking and lesional excision procedure Note  Date of Operation: 01/19/2021  Pre-op Diagnosis: Left mainstem endobronchial cancer  Post-op Diagnosis: Left mainstem endobronchial cancer  Surgeon: Garner Nash, DO   Assistants: none  Anesthesia: per anesthesia record  Operation: Flexible video fiberoptic bronchoscopy and biopsies.  Estimated Blood Loss: Less than 10 cc   Complications: none noted  Indications and History: Loretta Woods is 65 y.o. with history of recent diagnosis brain metastasis, left lung mass concerning for advanced age bronchogenic carcinoma.  Recommendation was to perform video fiberoptic bronchoscopy with biopsies. The risks, benefits, complications, treatment options and expected outcomes were discussed with the patient.  The possibilities of pneumothorax, pneumonia, reaction to medication, pulmonary aspiration, perforation of a viscus, bleeding, failure to diagnose a condition and creating a complication requiring transfusion or operation were discussed with the patient who freely signed the consent.    Description of Procedure: The patient was seen in the Preoperative Area, was examined and was deemed appropriate to proceed.  The patient was taken to [facility] endoscopy room 2, identified as Loretta Woods and the procedure verified as Flexible Video Fiberoptic Bronchoscopy.  A Time Out was held and the above information confirmed.   Patient was brought to [facility] endoscopy room 2 by anesthesia.  Patient was endotracheally intubated and placed under general anesthesia.  The standard therapeutic bronchoscope was inserted into the airway for airway inspection.  The right lung and main trachea appears normal there is no evidence of endobronchial disease.  The left lung however midway down the left mainstem there is a large amount of tumor eroding through the posterior wall and out of the left lower  lobe division.  Additionally there was splaying of the left carina as well as tumor infiltration along the anterior and lateral wall of the opening of the left upper lobe.  Using a 1: 10,000 dilution of epinephrine we saturated the tumor location.  There was blanching of the mucosa.  Using the 1.7 millimeter cryotherapy probe we completed endobronchial cryo biopsies with short freeze sections and en bloc retraction of the scope and cryoprobe.  These were all collected for surgical pathology.  We made every attempt to try to access the left lower lobe opening.  There was a large amount of tumor and no visible opening to the airway.  Due to the fact the most of the tumor was coming from the posterior membranous wall there was not a large amount of debulking to do along the posterior wall to ensure no concern for complication.  We were able to take most of the tumor down at least to the opening of the left lower lobe.  This will hopefully ensure patency of the left upper lobe if the tumor continues to grow.  Hopefully she will be able to start radiation therapy and chemotherapy soon.  At the conclusion of the procedure the standard therapeutic bronchoscope was used for aspiration of the bilateral mainstem's and removal of any remaining blood clot debris.  We also used saline to irrigate the left mainstem.  Following irrigation there was no evidence of active bleeding.  At the conclusion we used 2 additional aliquots of delusional epinephrine to saturate in the area for hemostasis.  The bronchoscope was brought to just above the main carina there was no evidence of active bleeding and patient tolerated procedure well.  Scope was removed.  Samples: 1.  Left mainstem endobronchial biopsies  Plans:  We will  review the cytology, pathology results with the patient when they become available.  Outpatient followup will be with Dr Valeta Harms.   Garner Nash, DO [facility] 01/19/2021 11:07 AM

## 2021-01-19 NOTE — Progress Notes (Signed)
Pt returned to room from [facility] via [facility]; pt in stable condition and denies discomfort or concerns at this time. Coolidge Breeze, RN 01/19/2021

## 2021-01-19 NOTE — Progress Notes (Signed)
Brief oncology note:  I was notified of plan to discharge this patient later today.  I have sent a message to the cancer center scheduling team to arrange for a 1 week follow-up with Dr. Lorenso Courier.  The patient will be called with the date/time of her appointment.  Recommend for her to continue current dose of dexamethasone upon discharge.   Mikey Bussing, DNP, AGPCNP-BC, AOCNP

## 2021-01-19 NOTE — Anesthesia Procedure Notes (Signed)
Procedure Name: Intubation Date/Time: 01/19/2021 10:37 AM Performed by: Leonor Liv, CRNA Pre-anesthesia Checklist: Patient identified, Emergency Drugs available, Suction available and Patient being monitored Patient Re-evaluated:Patient Re-evaluated prior to induction Oxygen Delivery Method: Circle System Utilized Preoxygenation: Pre-oxygenation with 100% oxygen Induction Type: IV induction Ventilation: Mask ventilation without difficulty Laryngoscope Size: Mac and 3 Grade View: Grade I Tube type: Oral Tube size: 8.5 mm Number of attempts: 1 Airway Equipment and Method: Stylet and Oral airway Placement Confirmation: ETT inserted through vocal cords under direct vision, positive ETCO2 and breath sounds checked- equal and bilateral Secured at: 19 cm Tube secured with: Tape Dental Injury: Teeth and Oropharynx as per pre-operative assessment

## 2021-01-19 NOTE — Anesthesia Preprocedure Evaluation (Addendum)
Anesthesia Evaluation  Patient identified by MRN, date of birth, ID band Patient awake    Reviewed: Allergy & Precautions, NPO status , Patient's Chart, lab work & pertinent test results  Airway Mallampati: II  TM Distance: >3 FB Neck ROM: Full    Dental  (+) Edentulous Upper, Edentulous Lower, Dental Advisory Given   Pulmonary neg pulmonary ROS, former smoker,    Pulmonary exam normal breath sounds clear to auscultation       Cardiovascular hypertension, Pt. on medications Normal cardiovascular exam Rhythm:Regular Rate:Normal     Neuro/Psych negative neurological ROS     GI/Hepatic negative GI ROS, Neg liver ROS,   Endo/Other  negative endocrine ROS  Renal/GU negative Renal ROS     Musculoskeletal negative musculoskeletal ROS (+)   Abdominal (+) - obese,   Peds  Hematology negative hematology ROS (+)   Anesthesia Other Findings   Reproductive/Obstetrics                            Anesthesia Physical Anesthesia Plan  ASA: 3  Anesthesia Plan: General   Post-op Pain Management:    Induction: Intravenous  PONV Risk Score and Plan: 3 and Ondansetron, Dexamethasone, Midazolam and Treatment may vary due to age or medical condition  Airway Management Planned: Oral ETT  Additional Equipment: None  Intra-op Plan:   Post-operative Plan: Extubation in OR  Informed Consent: I have reviewed the patients History and Physical, chart, labs and discussed the procedure including the risks, benefits and alternatives for the proposed anesthesia with the patient or authorized representative who has indicated his/her understanding and acceptance.     Dental advisory given  Plan Discussed with: CRNA  Anesthesia Plan Comments:        Anesthesia Quick Evaluation

## 2021-01-19 NOTE — Progress Notes (Signed)
Contacted [facility] for transport back to [facility]

## 2021-01-19 NOTE — Progress Notes (Signed)
Pt resting comfortably awaiting transport service to [facility].  Pt drinking water without difficulty

## 2021-01-19 NOTE — Discharge Instructions (Signed)
Flexible Bronchoscopy, Care After This sheet gives you information about how to care for yourself after your test. Your doctor may also give you more specific instructions. If you have problems or questions, contact your doctor. Follow these instructions at home: Eating and drinking Do not eat or drink anything (not even water) for 2 hours after your test, or until your numbing medicine (local anesthetic) wears off. When your numbness is gone and your cough and gag reflexes have come back, you may: Eat only soft foods. Slowly drink liquids. The day after the test, go back to your normal diet. Driving Do not drive for 24 hours if you were given a medicine to help you relax (sedative). Do not drive or use heavy machinery while taking prescription pain medicine. General instructions  Take over-the-counter and prescription medicines only as told by your doctor. Return to your normal activities as told. Ask what activities are safe for you. Do not use any products that have nicotine or tobacco in them. This includes cigarettes and e-cigarettes. If you need help quitting, ask your doctor. Keep all follow-up visits as told by your doctor. This is important. It is very important if you had a tissue sample (biopsy) taken. Get help right away if: You have shortness of breath that gets worse. You get light-headed. You feel like you are going to pass out (faint). You have chest pain. You cough up: More than a little blood. More blood than before. Summary Do not eat or drink anything (not even water) for 2 hours after your test, or until your numbing medicine wears off. Do not use cigarettes. Do not use e-cigarettes. Get help right away if you have chest pain.  This information is not intended to replace advice given to you by your health care provider. Make sure you discuss any questions you have with your health care provider. Document Released: 04/28/2009 Document Revised: 06/13/2017 Document  Reviewed: 07/19/2016 Elsevier Patient Education  2020 [employer].

## 2021-01-19 NOTE — Discharge Summary (Signed)
Physician Discharge Summary  Loretta Woods QIO:[id number] DOB: 1955-11-08 DOA: 01/17/2021  PCP: Pcp, No  Admit date: 01/17/2021 Discharge date: 01/19/2021  Admitted From: Home Disposition: Home   Recommendations for Outpatient Follow-up:  Follow up with pulmonology (Dr. Valeta Harms), oncology (Dr. Lorenso Courier), and neurosurgery (Dr. Christella Noa), and/or radiation oncology. Further plan is pending results of endobronchial biopsy performed 7/8.  Continue decadron and keppra.  Home Health: None Equipment/Devices: None Discharge Condition: Stable CODE STATUS: Full Diet recommendation: Heart healthy  Brief/Interim Summary: Loretta Woods is a 65 y.o. female with a history of tobacco use, HTN who was sent to the ED 7/7 from PCP's office for concern of stroke after presenting with 3 weeks of worsening right-sided headache, facial weakness, and severe HTN (207/94). CT head revealed a right temporoparietal mass with associated vasogenic edema, mass effect on right ventricle and 43mm midline shift. Decadron and keppra started. MRI brain the following morning confirmedthe mass w/ midline shift. CT chest/abd/pelvis revealed a 7.5cm LLL lesion with associated loculated effusion as well as a 58mm medial LUL nodule and thoracic nodal metastasis. PCCM was consulted, performed thoracentesis, sent for cytology, and is arranged for bronchoscopy/biopsy performed 7/8 prior to discharge. Neurosurgery consultation was performed and further treatment will depend on biopsy data.   Discharge Diagnoses:  Principal Problem:   Brain mass Active Problems:   Lung mass   Hypertensive urgency   Hyponatremia   Hypokalemia   Endobronchial cancer, left (HCC)  Metastatic disease Brain and lung masses: In long term smoker with recent constitutional symptoms, strong suspicion for primary bronchogenic carcinoma w/metastases. PCCM performed thoracentesis 7/7 and endobronchial biopsy 7/8. Results pending at discharge.  - Pulmonary, oncology, and  neurosurgery all to arrange follow up with the patient and have cleared her for discharge.  - Continue keppra seizure ppx and decadron. Significant improvement in symptoms w/decadron since admission. Neurosurgery, Dr. Christella Noa evaluated the patient. Per his note: Will await biopsy results and speak with radiation oncology/oncology at that time.   Hypertensive urgency: Improving without IV Tx.    Hyponatremia: Improving. Isoosmolar - Restart HCTZ   Hypokalemia: Resolved. Mg 2.3.    Hyperlipidemia - Continue statin   Hypothyroidism: TSH 2.937 - Continue synthroid.   Discharge Instructions Discharge Instructions     Diet - low sodium heart healthy   Complete by: As directed    Discharge instructions   Complete by: As directed    You were evaluated for a mass in the brain and lung and have had a biopsy of the left lung mass. The results to give a diagnosis won't return until early next week, and you've been cleared for discharge. You will need to follow up with pulmonology (Dr. Valeta Harms) next week. If you don't hear from them to schedule this, please call their office on Monday. You will also need follow up with the oncology clinic. You should also hear from them to establish an appointment.  - Continue decadron to reduce brain swelling and keppra to reduce risk of seizure.  - If your symptoms worsen, seek medical attention right away.      Allergies as of 01/19/2021   No Known Allergies      Medication List     TAKE these medications    acetaminophen 500 MG tablet Commonly known as: TYLENOL Take 500 mg by mouth every 6 (six) hours as needed for moderate pain.   atorvastatin 10 MG tablet Commonly known as: LIPITOR Take 10 mg by mouth daily.   cycloSPORINE 0.05 % ophthalmic  emulsion Commonly known as: RESTASIS Place 1 drop into both eyes 2 (two) times daily.   dexamethasone 6 MG tablet Commonly known as: DECADRON Take 1 tablet (6 mg total) by mouth every 6 (six) hours.    Emergen-C Immune Pack Take 1 packet by mouth daily as needed (immune support).   Euthyrox 75 MCG tablet Generic drug: levothyroxine Take 75 mcg by mouth daily.   hydrochlorothiazide 50 MG tablet Commonly known as: HYDRODIURIL Take 25 mg by mouth daily.   levETIRAcetam 500 MG tablet Commonly known as: KEPPRA Take 1 tablet (500 mg total) by mouth 2 (two) times daily.   olopatadine 0.1 % ophthalmic solution Commonly known as: PATANOL Place 1 drop into both eyes daily.        Follow-up Information     [facility] Follow up.          Ashok Pall, MD .   Specialty: Neurosurgery Contact information: [address] [place] [address] [phone]         Garner Nash, DO .   Specialty: Pulmonary Disease Contact information: [address] [place] [address] [phone]                No Known Allergies  Consultations: PCCM Oncology Neurosurgery  Procedures/Studies: CT Head Wo Contrast  Result Date: 01/17/2021 CLINICAL DATA:  Mental status change EXAM: CT HEAD WITHOUT CONTRAST TECHNIQUE: Contiguous axial images were obtained from the base of the skull through the vertex without intravenous contrast. COMPARISON:  None. FINDINGS: Brain: Extensive hypodensity/edema within the right hemispheric white matter and basal ganglia. 2.7 x 1.9 by 2.3 cm suspected mass within the right temporoparietal region. About 5 mm midline shift to the left. Mass effect on the right lateral ventricle. Possible small chronic appearing infarct in the right cerebellum peripherally. Punctate cortical calcification at the left frontal lobe. Vascular: No hyperdense vessels.  No unexpected calcification. Skull: Normal. Negative for fracture or focal lesion. Sinuses/Orbits: No acute finding. Other: None. IMPRESSION: 1. Extensive white matter edema within the right hemisphere with about 5 mm midline shift to the left and mass effect on right  lateral ventricle. Approximate 2.7 cm suspected mass in the right temporoparietal region concerning for metastatic focus or primary brain neoplasm. MRI with and without contrast is recommended for further evaluation. Critical Value/emergent results were called by telephone at the time of interpretation on 01/17/2021 at 6:50 pm to provider Jake in the ED, patient in triage , who verbally acknowledged these results. Electronically Signed   By: Donavan Foil M.D.   On: 01/17/2021 18:51   MR BRAIN W WO CONTRAST  Result Date: 01/17/2021 CLINICAL DATA:  Brain mass EXAM: MRI HEAD WITHOUT AND WITH CONTRAST TECHNIQUE: Multiplanar, multiecho pulse sequences of the brain and surrounding structures were obtained without and with intravenous contrast. CONTRAST:  5.60mL GADAVIST GADOBUTROL 1 MMOL/ML IV SOLN COMPARISON:  Head CT 01/17/2021 FINDINGS: Brain: There are multiple enhancing masses within the brain. The largest lesion is in the right temporal lobe. There are solid and necrotic components of this mass. The solid component measures approximately 3.6 x 2.7 cm may reflect a component of dural reaction. The necrotic portion measures approximately 4.0 x 2.0 cm. More superior right temporal mass measures 2.0 x 1.8 cm. There are subcentimeter lesions in both occipital lobes, the left temporal lobe, right parietal lobe and the cerebellum. The lesions are annotated on series 16. There is severe edema within the right temporal  and parietal lobes extending into the area of the right internal and external capsules. There is petechial hemorrhage associated with the anterior right temporal lesion. There is 4 mm of leftward midline shift. Vascular: Major flow voids are preserved. Skull and upper cervical spine: Normal calvarium and skull base. Visualized upper cervical spine and soft tissues are normal. Sinuses/Orbits:No paranasal sinus fluid levels or advanced mucosal thickening. Small amount of mastoid fluid. Normal orbits.  IMPRESSION: Multiple enhancing masses within the brain, consistent with metastatic disease. The largest lesion is in the right temporal lobe with severe surrounding edema and 4 mm of leftward midline shift. Electronically Signed   By: Ulyses Jarred M.D.   On: 01/17/2021 23:31   CT CHEST ABDOMEN PELVIS W CONTRAST  Result Date: 01/17/2021 CLINICAL DATA:  Brain mass, staging EXAM: CT CHEST, ABDOMEN, AND PELVIS WITH CONTRAST TECHNIQUE: Multidetector CT imaging of the chest, abdomen and pelvis was performed following the standard protocol during bolus administration of intravenous contrast. CONTRAST:  161mL OMNIPAQUE IOHEXOL 300 MG/ML  SOLN COMPARISON:  None. FINDINGS: CT CHEST FINDINGS Cardiovascular: The heart is normal in size. No pericardial effusion. No evidence of thoracic aortic aneurysm. Mild atherosclerotic calcifications of the aortic arch. Mild coronary atherosclerosis of the LAD and left circumflex. Mediastinum/Nodes: Thoracic lymphadenopathy, including a 16 mm short axis AP window node (series 2/image 27) and a 16 mm short axis left perihilar node (series 2/image 34). Lungs/Pleura: 7.4 x 5.5 cm posteromedial left lower lobe mass (series 2/image 39), compatible with primary bronchogenic neoplasm. Associated postobstructive atelectasis/collapse involving the left lower lobe. Small loculated left pleural effusion. Additional interlobular septal thickening in the central left upper lobe/perihilar region (series 4/image 56), likely post obstructive due to nodal metastases, although perilymphatic spread of tumor is not excluded. 7 mm nodule in the medial left upper lobe (series 4/image 100). Moderate centrilobular and paraseptal emphysematous changes, upper lung predominant. No focal consolidation. No pneumothorax. Musculoskeletal: No focal osseous lesions. CT ABDOMEN PELVIS FINDINGS Hepatobiliary: Liver is within normal limits. No suspicious/enhancing hepatic lesions. Gallbladder is unremarkable. No  intrahepatic or extrahepatic ductal dilatation. Pancreas: Within normal limits. Spleen: Within normal limits. Adrenals/Urinary Tract: Adrenal glands are within normal limits. Kidneys are within normal limits.  No hydronephrosis. Bladder is within normal limits. Stomach/Bowel: Stomach is within normal limits. No evidence of bowel obstruction. Appendix is not discretely visualized. Vascular/Lymphatic: No evidence of abdominal aortic aneurysm. Atherosclerotic calcifications of the abdominal aorta and branch vessels. No suspicious abdominopelvic lymphadenopathy. Reproductive: Calcified uterine fibroids (series 2/image 101). Right ovary is notable for a 2.1 cm cystic lesion (series 2/image 97), likely benign. Left ovary is within normal limits. Other: No abdominopelvic ascites. Musculoskeletal: Mild degenerative changes of the lumbar spine. No focal osseous lesions. IMPRESSION: 7.5 cm left lower lobe mass, compatible with primary bronchogenic neoplasm. Associated postobstructive left lower lobe atelectasis/collapse. Small loculated left pleural effusion. Postobstructive opacity versus perilymphatic spread of tumor in the central left upper lobe/perihilar region. 7 mm nodule in the medial left upper lobe, indeterminate, metastasis not excluded. Thoracic nodal metastases, as above. No evidence of metastatic disease in the abdomen/pelvis. Electronically Signed   By: Julian Hy M.D.   On: 01/17/2021 20:40   DG CHEST PORT 1 VIEW  Result Date: 01/18/2021 CLINICAL DATA:  Status post left-sided thoracentesis. EXAM: PORTABLE CHEST 1 VIEW COMPARISON:  CT chest from yesterday. FINDINGS: Normal heart size. Normal pulmonary vascularity. Resolved left pleural effusion. No pneumothorax. The lungs remain hyperinflated with emphysematous changes. Large mass in the medial left lower  lobe grossly unchanged. Mild linear scarring in the lingula. The right lung is clear. No acute osseous abnormality. IMPRESSION: 1. Resolved left  pleural effusion. No pneumothorax. 2. Unchanged left lower lobe mass. Electronically Signed   By: Titus Dubin M.D.   On: 01/18/2021 10:20    01/19/2021 Dr. Valeta Harms: Flexible video fiberoptic bronchoscopy and biopsies.  Subjective: Feels "fine" with improved headache and no dyspnea. Wants to go home ASAP.   Discharge Exam: Vitals:   01/19/21 1138 01/19/21 1223  BP: (!) 143/75 (!) 146/71  Pulse: (!) 102 62  Resp: (!) 21 16  Temp:  97.6 F (36.4 C)  SpO2: 96% 94%   General: Pt is alert, awake, not in acute distress Cardiovascular: RRR, S1/S2 +, no rubs, no gallops Respiratory: CTA bilaterally, no wheezing, no rhonchi Abdominal: Soft, NT, ND, bowel sounds + Extremities: No edema, no cyanosis  Labs: BNP (last 3 results) No results for input(s): BNP in the last 8760 hours. Basic Metabolic Panel: Recent Labs  Lab 01/17/21 1803 01/18/21 0504  NA 129* 134*  K 3.2* 4.4  CL 93* 95*  CO2 25 26  GLUCOSE 103* 133*  BUN 14 13  CREATININE 0.56 0.62  CALCIUM 9.1 10.2  MG  --  2.3   Liver Function Tests: Recent Labs  Lab 01/17/21 1803  AST 20  ALT 17  ALKPHOS 83  BILITOT 0.7  PROT 7.3  ALBUMIN 4.2   No results for input(s): LIPASE, AMYLASE in the last 168 hours. No results for input(s): AMMONIA in the last 168 hours. CBC: Recent Labs  Lab 01/17/21 1803  WBC 9.7  NEUTROABS 7.4  HGB 12.3  HCT 36.6  MCV 86.3  PLT 344   Cardiac Enzymes: No results for input(s): CKTOTAL, CKMB, CKMBINDEX, TROPONINI in the last 168 hours. BNP: Invalid input(s): POCBNP CBG: No results for input(s): GLUCAP in the last 168 hours. D-Dimer No results for input(s): DDIMER in the last 72 hours. Hgb A1c No results for input(s): HGBA1C in the last 72 hours. Lipid Profile No results for input(s): CHOL, HDL, LDLCALC, TRIG, CHOLHDL, LDLDIRECT in the last 72 hours. Thyroid function studies Recent Labs    01/18/21 0504  TSH 2.937   Anemia work up No results for input(s): VITAMINB12,  FOLATE, FERRITIN, TIBC, IRON, RETICCTPCT in the last 72 hours. Urinalysis No results found for: COLORURINE, APPEARANCEUR, LABSPEC, [place], GLUCOSEU, [place], BILIRUBINUR, KETONESUR, PROTEINUR, UROBILINOGEN, NITRITE, LEUKOCYTESUR  Microbiology Recent Results (from the past 240 hour(s))  Resp Panel by RT-PCR (Flu A&B, Covid) Nasopharyngeal Swab     Status: None   Collection Time: 01/17/21  7:15 PM   Specimen: Nasopharyngeal Swab; Nasopharyngeal(NP) swabs in vial transport medium  Result Value Ref Range Status   SARS Coronavirus 2 by RT PCR NEGATIVE NEGATIVE Final    Comment: (NOTE) SARS-CoV-2 target nucleic acids are NOT DETECTED.  The SARS-CoV-2 RNA is generally detectable in upper respiratory specimens during the acute phase of infection. The lowest concentration of SARS-CoV-2 viral copies this assay can detect is 138 copies/mL. A negative result does not preclude SARS-Cov-2 infection and should not be used as the sole basis for treatment or other patient management decisions. A negative result may occur with  improper specimen collection/handling, submission of specimen other than nasopharyngeal swab, presence of viral mutation(s) within the areas targeted by this assay, and inadequate number of viral copies(<138 copies/mL). A negative result must be combined with clinical observations, patient history, and epidemiological information. The expected result is Negative.  Fact Sheet  for Patients:  EntrepreneurPulse.com.au  Fact Sheet for Healthcare Providers:  IncredibleEmployment.be  This test is no t yet approved or cleared by the [place] FDA and  has been authorized for detection and/or diagnosis of SARS-CoV-2 by FDA under an Emergency Use Authorization (EUA). This EUA will remain  in effect (meaning this test can be used) for the duration of the COVID-19 declaration under Section 564(b)(1) of the Act, 21 U.S.C.section 360bbb-3(b)(1),  unless the authorization is terminated  or revoked sooner.       Influenza A by PCR NEGATIVE NEGATIVE Final   Influenza B by PCR NEGATIVE NEGATIVE Final    Comment: (NOTE) The Xpert Xpress SARS-CoV-2/FLU/RSV plus assay is intended as an aid in the diagnosis of influenza from Nasopharyngeal swab specimens and should not be used as a sole basis for treatment. Nasal washings and aspirates are unacceptable for Xpert Xpress SARS-CoV-2/FLU/RSV testing.  Fact Sheet for Patients: EntrepreneurPulse.com.au  Fact Sheet for Healthcare Providers: IncredibleEmployment.be  This test is not yet approved or cleared by the [place] FDA and has been authorized for detection and/or diagnosis of SARS-CoV-2 by FDA under an Emergency Use Authorization (EUA). This EUA will remain in effect (meaning this test can be used) for the duration of the COVID-19 declaration under Section 564(b)(1) of the Act, 21 U.S.C. section 360bbb-3(b)(1), unless the authorization is terminated or revoked.  Performed at [facility], [place] [address] [place], [place] [address]   Body fluid culture w Gram Stain     Status: None (Preliminary result)   Collection Time: 01/18/21  9:16 AM   Specimen: Pleura; Body Fluid  Result Value Ref Range Status   Specimen Description   Final    PLEURAL LT Performed at [facility] [address], [place], [place] [address]    Special Requests   Final    NONE Performed at [facility], [place] [address], [place], [place] [address]    Gram Stain   Final    FEW WBC PRESENT, PREDOMINANTLY MONONUCLEAR NO ORGANISMS SEEN    Culture   Final    NO GROWTH < 24 HOURS Performed at [facility] [address], [place], [place] [address]    Report Status PENDING  Incomplete    Time coordinating discharge: Approximately 40 minutes  Patrecia Pour, MD  [facility] 01/19/2021, 12:51 PM

## 2021-01-19 NOTE — Transfer of Care (Signed)
Immediate Anesthesia Transfer of Care Note  Patient: Loretta Woods  Procedure(s) Performed: VIDEO BRONCHOSCOPY WITH ENDOBRONCHIAL ULTRASOUND HEMOSTASIS CONTROL CRYOTHERAPY  Patient Location: PACU and Endoscopy Unit  Anesthesia Type:General  Level of Consciousness: drowsy  Airway & Oxygen Therapy: Patient Spontanous Breathing and Patient connected to face mask oxygen  Post-op Assessment: Report given to RN and Post -op Vital signs reviewed and stable  Post vital signs: Reviewed and stable  Last Vitals:  Vitals Value Taken Time  BP 131/57 01/19/21 1121  Temp    Pulse 89 01/19/21 1123  Resp 16 01/19/21 1123  SpO2 97 % 01/19/21 1123  Vitals shown include unvalidated device data.  Last Pain:  Vitals:   01/19/21 0915  TempSrc: Oral  PainSc:          Complications: No notable events documented.

## 2021-01-19 NOTE — Interval H&P Note (Signed)
History and Physical Interval Note:  01/19/2021 10:30 AM  Loretta Woods  has presented today for surgery, with the diagnosis of lung mass.  The various methods of treatment have been discussed with the patient and family. After consideration of risks, benefits and other options for treatment, the patient has consented to  Procedure(s) with comments: [place] (N/A) - cryo probe as a surgical intervention.  The patient's history has been reviewed, patient examined, no change in status, stable for surgery.  I have reviewed the patient's chart and labs.  Questions were answered to the patient's satisfaction.     [place]

## 2021-01-19 NOTE — Anesthesia Postprocedure Evaluation (Signed)
Anesthesia Post Note  Patient: Loretta Woods  Procedure(s) Performed: VIDEO BRONCHOSCOPY WITH ENDOBRONCHIAL ULTRASOUND HEMOSTASIS CONTROL CRYOTHERAPY     Patient location during evaluation: PACU Anesthesia Type: General Level of consciousness: sedated and patient cooperative Pain management: pain level controlled Vital Signs Assessment: post-procedure vital signs reviewed and stable Respiratory status: spontaneous breathing Cardiovascular status: stable Anesthetic complications: no   No notable events documented.  Last Vitals:  Vitals:   01/19/21 1138 01/19/21 1223  BP: (!) 143/75 (!) 146/71  Pulse: (!) 102 62  Resp: (!) 21 16  Temp:  36.4 C  SpO2: 96% 94%    Last Pain:  Vitals:   01/19/21 1223  TempSrc: Oral  PainSc:                  Nolon Nations

## 2021-01-19 NOTE — Progress Notes (Signed)
Pt discharged to home in stable condition accompanied by sister. Coolidge Breeze, RN 01/19/2021

## 2021-01-19 NOTE — Progress Notes (Signed)
Pt transported to [facility] in stable condition via [facility] for her procedure.  Report given to nurse in [place] pre-op area. Coolidge Breeze, RN 01/19/2021

## 2021-01-21 LAB — BODY FLUID CULTURE W GRAM STAIN: Culture: NO GROWTH

## 2021-01-23 ENCOUNTER — Telehealth: Payer: Self-pay | Admitting: Pulmonary Disease

## 2021-01-23 LAB — SURGICAL PATHOLOGY

## 2021-01-23 NOTE — Telephone Encounter (Signed)
Called and spoke with pt who is wanting to know if there are any results back from her bronch/biopsy that was performed 7/8. Dr. Valeta Harms, please advise.

## 2021-01-23 NOTE — Telephone Encounter (Signed)
Pt is wanting to know if there are any results from her biopsy that was done on 01/19/2021. Pls regard; [phone]

## 2021-01-23 NOTE — Telephone Encounter (Signed)
Loretta Nash, DO  Loretta Woods, Loretta Woods, CMA; Orson Slick, MD; P [facility] Triage Pool Caller: Unspecified (Today,  4:05 PM)  PCCM:   I called and spoke with the patient regarding her final path results that came out today.   + Adenocarcinoma.   She has follow up already scheduled with Dr. Lorenso Courier.   Loretta Woods, please schedule patient with me in August for 15 min follow up appt.   Thanks   [place] and spoke with pt and have scheduled her for an appt with BI. Gave pt our office address. Nothing further needed.

## 2021-01-25 ENCOUNTER — Telehealth: Payer: Self-pay | Admitting: *Deleted

## 2021-01-25 NOTE — Telephone Encounter (Signed)
Received vm message from patient inquiring about f/u appts.  TCT patient and spoke with her. Advised that she has appts here on Monday, 01/29/21  Advised that she has labs @ 9:15 am and then appt with Dr. Lorenso Courier @ [facility] he will be going over her situation and plans of care at that time.  She voiced understanding.

## 2021-01-29 ENCOUNTER — Other Ambulatory Visit: Payer: Self-pay | Admitting: Hematology and Oncology

## 2021-01-29 ENCOUNTER — Encounter: Payer: Self-pay | Admitting: Hematology and Oncology

## 2021-01-29 ENCOUNTER — Inpatient Hospital Stay: Payer: 59

## 2021-01-29 ENCOUNTER — Inpatient Hospital Stay: Payer: 59 | Attending: Hematology and Oncology | Admitting: Hematology and Oncology

## 2021-01-29 ENCOUNTER — Other Ambulatory Visit: Payer: Self-pay

## 2021-01-29 VITALS — BP 168/96 | HR 104 | Temp 97.8°F | Resp 18 | Wt 90.6 lb

## 2021-01-29 DIAGNOSIS — Z7952 Long term (current) use of systemic steroids: Secondary | ICD-10-CM | POA: Insufficient documentation

## 2021-01-29 DIAGNOSIS — J910 Malignant pleural effusion: Secondary | ICD-10-CM | POA: Diagnosis not present

## 2021-01-29 DIAGNOSIS — Z8052 Family history of malignant neoplasm of bladder: Secondary | ICD-10-CM | POA: Diagnosis not present

## 2021-01-29 DIAGNOSIS — I10 Essential (primary) hypertension: Secondary | ICD-10-CM | POA: Diagnosis not present

## 2021-01-29 DIAGNOSIS — C3432 Malignant neoplasm of lower lobe, left bronchus or lung: Secondary | ICD-10-CM | POA: Diagnosis not present

## 2021-01-29 DIAGNOSIS — G9389 Other specified disorders of brain: Secondary | ICD-10-CM

## 2021-01-29 DIAGNOSIS — Z87891 Personal history of nicotine dependence: Secondary | ICD-10-CM | POA: Diagnosis not present

## 2021-01-29 DIAGNOSIS — Z7189 Other specified counseling: Secondary | ICD-10-CM

## 2021-01-29 DIAGNOSIS — C3492 Malignant neoplasm of unspecified part of left bronchus or lung: Secondary | ICD-10-CM

## 2021-01-29 DIAGNOSIS — R634 Abnormal weight loss: Secondary | ICD-10-CM | POA: Diagnosis not present

## 2021-01-29 DIAGNOSIS — C7931 Secondary malignant neoplasm of brain: Secondary | ICD-10-CM | POA: Insufficient documentation

## 2021-01-29 LAB — CBC WITH DIFFERENTIAL (CANCER CENTER ONLY)
Abs Immature Granulocytes: 1.42 10*3/uL — ABNORMAL HIGH (ref 0.00–0.07)
Basophils Absolute: 0 10*3/uL (ref 0.0–0.1)
Basophils Relative: 0 %
Eosinophils Absolute: 0 10*3/uL (ref 0.0–0.5)
Eosinophils Relative: 0 %
HCT: 45.1 % (ref 36.0–46.0)
Hemoglobin: 15.9 g/dL — ABNORMAL HIGH (ref 12.0–15.0)
Immature Granulocytes: 3 %
Lymphocytes Relative: 2 %
Lymphs Abs: 1 10*3/uL (ref 0.7–4.0)
MCH: 29.7 pg (ref 26.0–34.0)
MCHC: 35.3 g/dL (ref 30.0–36.0)
MCV: 84.3 fL (ref 80.0–100.0)
Monocytes Absolute: 3.6 10*3/uL — ABNORMAL HIGH (ref 0.1–1.0)
Monocytes Relative: 8 %
Neutro Abs: 37.2 10*3/uL — ABNORMAL HIGH (ref 1.7–7.7)
Neutrophils Relative %: 86 %
Platelet Count: 437 10*3/uL — ABNORMAL HIGH (ref 150–400)
RBC: 5.35 MIL/uL — ABNORMAL HIGH (ref 3.87–5.11)
RDW: 13.1 % (ref 11.5–15.5)
WBC Count: 43.2 10*3/uL — ABNORMAL HIGH (ref 4.0–10.5)
nRBC: 0 % (ref 0.0–0.2)

## 2021-01-29 LAB — CMP (CANCER CENTER ONLY)
ALT: 34 U/L (ref 0–44)
AST: 15 U/L (ref 15–41)
Albumin: 3.7 g/dL (ref 3.5–5.0)
Alkaline Phosphatase: 95 U/L (ref 38–126)
Anion gap: 10 (ref 5–15)
BUN: 27 mg/dL — ABNORMAL HIGH (ref 8–23)
CO2: 29 mmol/L (ref 22–32)
Calcium: 9.4 mg/dL (ref 8.9–10.3)
Chloride: 92 mmol/L — ABNORMAL LOW (ref 98–111)
Creatinine: 0.71 mg/dL (ref 0.44–1.00)
GFR, Estimated: >60 mL/min (ref >60)
Glucose, Bld: 108 mg/dL — ABNORMAL HIGH (ref 70–99)
Potassium: 3.8 mmol/L (ref 3.5–5.1)
Sodium: 131 mmol/L — ABNORMAL LOW (ref 135–145)
Total Bilirubin: 0.6 mg/dL (ref 0.3–1.2)
Total Protein: 6.8 g/dL (ref 6.5–8.1)

## 2021-01-29 LAB — TSH: TSH: 0.184 u[IU]/mL — ABNORMAL LOW (ref 0.308–3.960)

## 2021-01-29 NOTE — Progress Notes (Signed)
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  CT head revealed a right temporoparietal mass with associated vasogenic edema.  MRI brain showed multiple enhancing masses consistent with metastatic disease.  The largest lesion is the right temporal lobe with severe surrounding edema and 4 mm leftward midline shift.  CT chest abdomen pelvis with contrast showed a 7.5 cm left lower lobe mass compatible with primary bronchogenic neoplasm and small loculated left pleural effusion. 01/18/2021: establish care with Dr. Lorenso Courier 01/19/2021: bronchoscopy performed, final pathology consistent with Adenocarcinoma of the lung.   Interval History:  Loretta Woods 65 y.o. female with medical history significant for metastatic adenocarcinoma of the lung who presents for a follow up visit. The patient was last seen while in house from 7/6-01/19/2021 at time of diagnosis. In the interim since the last visit she has continued on dexamethasone therapy.   On exam today Loretta Woods reports she has been well in the interim since her last visit.  She is continued on her steroid therapy and notes that she is not having any major side effects as result of this treatment.  She notes that she is not currently having any headaches though she is still having some trouble blinking her right eye.  She notes that she has had no shortness of breath, cough, or trouble breathing.  Otherwise she has not been having any other major changes in her health.  She continues to lose weight and is currently down 15 pounds.  She is been doing her best to eat well.  A full 10 point ROS is listed below.  Today we discussed the  steps moving forward including multidisciplinary discussion for brain tumors, port placement, and likely start of chemotherapy.  MEDICAL HISTORY:  Past Medical History:  Diagnosis Date   Hypertension    Thyroid disease     SURGICAL HISTORY: Past Surgical History:  Procedure Laterality Date   CRYOTHERAPY  01/19/2021   Procedure: CRYOTHERAPY;  Surgeon: Garner Nash, DO;  Location: [facility];  Service: Pulmonary;;   HEMOSTASIS CONTROL  01/19/2021   Procedure: HEMOSTASIS CONTROL;  Surgeon: Garner Nash, DO;  Location: [facility];  Service: Pulmonary;;   VIDEO BRONCHOSCOPY  01/19/2021   Procedure: VIDEO BRONCHOSCOPY WITHOUT FLUORO;  Surgeon: Garner Nash, DO;  Location: [facility];  Service: Pulmonary;;    SOCIAL HISTORY: Social History   Socioeconomic History   Marital status: Divorced    Spouse name: Not on file   Number of children: 0   Years of education: Not on file   Highest education level: Not on file  Occupational History   Not on file  Tobacco Use   Smoking status: Former    Packs/day: 1.00    Types: Cigarettes    Quit date: 2012    Years since quitting: 10.5   Smokeless tobacco: Never  Vaping Use   Vaping Use: Every day  Substance and Sexual Activity   Alcohol use: Never   Drug use: Never   Sexual activity: Not on file  Other Topics Concern   Not on file  Social History Narrative   Not on file   Social Determinants of Health   Financial Resource Strain: Not on file  Food Insecurity:  Not on file  Transportation Needs: Not on file  Physical Activity: Not on file  Stress: Not on file  Social Connections: Not on file  Intimate Partner Violence: Not on file    FAMILY HISTORY: Family History  Problem Relation Age of Onset   Bladder Cancer Father     ALLERGIES:  has No Known Allergies.  MEDICATIONS:  Current Outpatient Medications  Medication Sig Dispense Refill   acetaminophen (TYLENOL) 500 MG tablet Take 500 mg by mouth every 6  (six) hours as needed for moderate pain.     atorvastatin (LIPITOR) 10 MG tablet Take 10 mg by mouth daily.     cycloSPORINE (RESTASIS) 0.05 % ophthalmic emulsion Place 1 drop into both eyes 2 (two) times daily.     dexamethasone (DECADRON) 6 MG tablet Take 1 tablet (6 mg total) by mouth every 6 (six) hours. 120 tablet 0   EUTHYROX 75 MCG tablet Take 75 mcg by mouth daily.     hydrochlorothiazide (HYDRODIURIL) 50 MG tablet Take 25 mg by mouth daily.     levETIRAcetam (KEPPRA) 500 MG tablet Take 1 tablet (500 mg total) by mouth 2 (two) times daily. 60 tablet 0   Multiple Vitamins-Minerals (EMERGEN-C IMMUNE) PACK Take 1 packet by mouth daily as needed (immune support).     No current facility-administered medications for this visit.    REVIEW OF SYSTEMS:   Constitutional: ( - ) fevers, ( - )  chills , ( - ) night sweats Eyes: ( - ) blurriness of vision, ( - ) double vision, ( - ) watery eyes Ears, nose, mouth, throat, and face: ( - ) mucositis, ( - ) sore throat Respiratory: ( - ) cough, ( - ) dyspnea, ( - ) wheezes Cardiovascular: ( - ) palpitation, ( - ) chest discomfort, ( - ) lower extremity swelling Gastrointestinal:  ( - ) nausea, ( - ) heartburn, ( - ) change in bowel habits Skin: ( - ) abnormal skin rashes Lymphatics: ( - ) new lymphadenopathy, ( - ) easy bruising Neurological: ( - ) numbness, ( - ) tingling, ( - ) new weaknesses Behavioral/Psych: ( - ) mood change, ( - ) new changes  All other systems were reviewed with the patient and are negative.  PHYSICAL EXAMINATION: ECOG PERFORMANCE STATUS: 1 - Symptomatic but completely ambulatory  Vitals:   01/29/21 1028  BP: (!) 168/96  Pulse: (!) 104  Resp: 18  Temp: 97.8 F (36.6 C)  SpO2: 99%   Filed Weights   01/29/21 1028  Weight: 90 lb 9.6 oz (41.1 kg)    GENERAL: Well-appearing middle-age Caucasian female, alert, no distress and comfortable SKIN: skin color, texture, turgor are normal, no rashes or significant  lesions EYES: conjunctiva are pink and non-injected, sclera clear LUNGS: clear to auscultation and percussion with normal breathing effort HEART: regular rate & rhythm and no murmurs and no lower extremity edema PSYCH: alert & oriented x 3, fluent speech NEURO: no focal motor/sensory deficits  LABORATORY DATA:  I have reviewed the data as listed CBC Latest Ref Rng & Units 01/29/2021 01/17/2021  WBC 4.0 - 10.5 K/uL 43.2(H) 9.7  Hemoglobin 12.0 - 15.0 g/dL 15.9(H) 12.3  Hematocrit 36.0 - 46.0 % 45.1 36.6  Platelets 150 - 400 K/uL 437(H) 344    CMP Latest Ref Rng & Units 01/29/2021 01/18/2021 01/17/2021  Glucose 70 - 99 mg/dL 108(H) 133(H) 103(H)  BUN 8 - 23 mg/dL 27(H) 13 14  Creatinine 0.44 - 1.00 mg/dL  0.71 0.62 0.56  Sodium 135 - 145 mmol/L 131(L) 134(L) 129(L)  Potassium 3.5 - 5.1 mmol/L 3.8 4.4 3.2(L)  Chloride 98 - 111 mmol/L 92(L) 95(L) 93(L)  CO2 22 - 32 mmol/L _0 Calcium 8.9 - 10.3 mg/dL 9.4 10.2 9.1  Total Protein 6.5 - 8.1 g/dL 6.8 - 7.3  Total Bilirubin 0.3 - 1.2 mg/dL 0.6 - 0.7  Alkaline Phos 38 - 126 U/L 95 - 83  AST 15 - 41 U/L 15 - 20  ALT 0 - 44 U/L 34 - 17   RADIOGRAPHIC STUDIES: I have personally reviewed the radiological images as listed and agreed with the findings in the report: Large left lower lobe lung mass with pleural effusion.  Review of MRI shows multiple brain lesions consistent with metastatic disease. CT Head Wo Contrast  Result Date: 01/17/2021 CLINICAL DATA:  Mental status change EXAM: CT HEAD WITHOUT CONTRAST TECHNIQUE: Contiguous axial images were obtained from the base of the skull through the vertex without intravenous contrast. COMPARISON:  None. FINDINGS: Brain: Extensive hypodensity/edema within the right hemispheric white matter and basal ganglia. 2.7 x 1.9 by 2.3 cm suspected mass within the right temporoparietal region. About 5 mm midline shift to the left. Mass effect on the right lateral ventricle. Possible small chronic appearing infarct  in the right cerebellum peripherally. Punctate cortical calcification at the left frontal lobe. Vascular: No hyperdense vessels.  No unexpected calcification. Skull: Normal. Negative for fracture or focal lesion. Sinuses/Orbits: No acute finding. Other: None. IMPRESSION: 1. Extensive white matter edema within the right hemisphere with about 5 mm midline shift to the left and mass effect on right lateral ventricle. Approximate 2.7 cm suspected mass in the right temporoparietal region concerning for metastatic focus or primary brain neoplasm. MRI with and without contrast is recommended for further evaluation. Critical Value/emergent results were called by telephone at the time of interpretation on 01/17/2021 at 6:50 pm to provider Jake in the ED, patient in triage , who verbally acknowledged these results. Electronically Signed   By: Donavan Foil M.D.   On: 01/17/2021 18:51   MR BRAIN W WO CONTRAST  Result Date: 01/17/2021 CLINICAL DATA:  Brain mass EXAM: MRI HEAD WITHOUT AND WITH CONTRAST TECHNIQUE: Multiplanar, multiecho pulse sequences of the brain and surrounding structures were obtained without and with intravenous contrast. CONTRAST:  5.3m GADAVIST GADOBUTROL 1 MMOL/ML IV SOLN COMPARISON:  Head CT 01/17/2021 FINDINGS: Brain: There are multiple enhancing masses within the brain. The largest lesion is in the right temporal lobe. There are solid and necrotic components of this mass. The solid component measures approximately 3.6 x 2.7 cm may reflect a component of dural reaction. The necrotic portion measures approximately 4.0 x 2.0 cm. More superior right temporal mass measures 2.0 x 1.8 cm. There are subcentimeter lesions in both occipital lobes, the left temporal lobe, right parietal lobe and the cerebellum. The lesions are annotated on series 16. There is severe edema within the right temporal and parietal lobes extending into the area of the right internal and external capsules. There is petechial  hemorrhage associated with the anterior right temporal lesion. There is 4 mm of leftward midline shift. Vascular: Major flow voids are preserved. Skull and upper cervical spine: Normal calvarium and skull base. Visualized upper cervical spine and soft tissues are normal. Sinuses/Orbits:No paranasal sinus fluid levels or advanced mucosal thickening. Small amount of mastoid fluid. Normal orbits. IMPRESSION: Multiple enhancing masses within the brain, consistent with metastatic disease. The largest lesion  is in the right temporal lobe with severe surrounding edema and 4 mm of leftward midline shift. Electronically Signed   By: Ulyses Jarred M.D.   On: 01/17/2021 23:31   CT CHEST ABDOMEN PELVIS W CONTRAST  Result Date: 01/17/2021 CLINICAL DATA:  Brain mass, staging EXAM: CT CHEST, ABDOMEN, AND PELVIS WITH CONTRAST TECHNIQUE: Multidetector CT imaging of the chest, abdomen and pelvis was performed following the standard protocol during bolus administration of intravenous contrast. CONTRAST:  131mL OMNIPAQUE IOHEXOL 300 MG/ML  SOLN COMPARISON:  None. FINDINGS: CT CHEST FINDINGS Cardiovascular: The heart is normal in size. No pericardial effusion. No evidence of thoracic aortic aneurysm. Mild atherosclerotic calcifications of the aortic arch. Mild coronary atherosclerosis of the LAD and left circumflex. Mediastinum/Nodes: Thoracic lymphadenopathy, including a 16 mm short axis AP window node (series 2/image 27) and a 16 mm short axis left perihilar node (series 2/image 34). Lungs/Pleura: 7.4 x 5.5 cm posteromedial left lower lobe mass (series 2/image 39), compatible with primary bronchogenic neoplasm. Associated postobstructive atelectasis/collapse involving the left lower lobe. Small loculated left pleural effusion. Additional interlobular septal thickening in the central left upper lobe/perihilar region (series 4/image 56), likely post obstructive due to nodal metastases, although perilymphatic spread of tumor is not  excluded. 7 mm nodule in the medial left upper lobe (series 4/image 100). Moderate centrilobular and paraseptal emphysematous changes, upper lung predominant. No focal consolidation. No pneumothorax. Musculoskeletal: No focal osseous lesions. CT ABDOMEN PELVIS FINDINGS Hepatobiliary: Liver is within normal limits. No suspicious/enhancing hepatic lesions. Gallbladder is unremarkable. No intrahepatic or extrahepatic ductal dilatation. Pancreas: Within normal limits. Spleen: Within normal limits. Adrenals/Urinary Tract: Adrenal glands are within normal limits. Kidneys are within normal limits.  No hydronephrosis. Bladder is within normal limits. Stomach/Bowel: Stomach is within normal limits. No evidence of bowel obstruction. Appendix is not discretely visualized. Vascular/Lymphatic: No evidence of abdominal aortic aneurysm. Atherosclerotic calcifications of the abdominal aorta and branch vessels. No suspicious abdominopelvic lymphadenopathy. Reproductive: Calcified uterine fibroids (series 2/image 101). Right ovary is notable for a 2.1 cm cystic lesion (series 2/image 97), likely benign. Left ovary is within normal limits. Other: No abdominopelvic ascites. Musculoskeletal: Mild degenerative changes of the lumbar spine. No focal osseous lesions. IMPRESSION: 7.5 cm left lower lobe mass, compatible with primary bronchogenic neoplasm. Associated postobstructive left lower lobe atelectasis/collapse. Small loculated left pleural effusion. Postobstructive opacity versus perilymphatic spread of tumor in the central left upper lobe/perihilar region. 7 mm nodule in the medial left upper lobe, indeterminate, metastasis not excluded. Thoracic nodal metastases, as above. No evidence of metastatic disease in the abdomen/pelvis. Electronically Signed   By: Julian Hy M.D.   On: 01/17/2021 20:40   DG CHEST PORT 1 VIEW  Result Date: 01/18/2021 CLINICAL DATA:  Status post left-sided thoracentesis. EXAM: PORTABLE CHEST 1 VIEW  COMPARISON:  CT chest from yesterday. FINDINGS: Normal heart size. Normal pulmonary vascularity. Resolved left pleural effusion. No pneumothorax. The lungs remain hyperinflated with emphysematous changes. Large mass in the medial left lower lobe grossly unchanged. Mild linear scarring in the lingula. The right lung is clear. No acute osseous abnormality. IMPRESSION: 1. Resolved left pleural effusion. No pneumothorax. 2. Unchanged left lower lobe mass. Electronically Signed   By: Titus Dubin M.D.   On: 01/18/2021 10:20    ASSESSMENT & PLAN Loretta Woods 65 y.o. female with medical history significant for metastatic adenocarcinoma of the lung who presents for a follow up visit.  After review of the labs, review of the records, discussion with the patient  the findings most consistent with metastatic adenocarcinoma of the lung with spread to the brain.  There is also an associated malignant pleural effusion.  Tap of the pleural effusion did not reveal cancer cells, though these are often quite low yield.  Given these findings I do believe the patient will require systemic therapy.  We are currently planning to start her on carboplatin, pembrolizumab, and pemetrexed.  We will move forward this, however if the NGS panel returns showing a targetable mutation we will alter the treatment course.  Prior to the start of this therapy we will need review by the brain tumor [facility] in order to discuss next best steps moving forward, with surgery versus radiation.  The patient voiced understanding of this plan.  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 Christella Noa in neurosurgery who recommended further review at Brain tumor [place].  --at this time will prepare patient for Carbo/Pem/Pem chemotherapy, which can be deferred or held if a mutation is noted or if another intervention (neurosurgery/RadOnc is required first). --Plan for port  placement. --Guardant 360 ordered today.  Foundation 1 and PD-L1 immunohistochemistry are currently pending on her biopsied tissue --Return to clinic in approximately 2 weeks time for consideration of start of therapy.   Orders Placed This Encounter  Procedures   Ambulatory referral to Radiation Oncology    Referral Priority:   Routine    Referral Type:   Consultation    Referral Reason:   Specialty Services Required    Requested Specialty:   Radiation Oncology    Number of Visits Requested:   1    All questions were answered. The patient knows to call the clinic with any problems, questions or concerns.  A total of more than 30 minutes were spent on this encounter with face-to-face time and non-face-to-face time, including preparing to see the patient, ordering tests and/or medications, counseling the patient and coordination of care as outlined above.   Ledell Peoples, MD Department of Hematology/Oncology [place] at [facility] Phone: [phone] Pager: [phone] Email: [email or web address]  01/29/2021 11:47 AM

## 2021-01-30 ENCOUNTER — Encounter: Payer: Self-pay | Admitting: *Deleted

## 2021-01-30 NOTE — Progress Notes (Signed)
I received a message from pathology dept. They need icd 10 code and stage of care before they can send molecular test. I updated them.

## 2021-01-31 ENCOUNTER — Other Ambulatory Visit: Payer: Self-pay | Admitting: Radiation Therapy

## 2021-01-31 ENCOUNTER — Telehealth: Payer: Self-pay | Admitting: Hematology and Oncology

## 2021-01-31 NOTE — Telephone Encounter (Signed)
Scheduled per los. Called and spoke with patient. Confirmed appt 

## 2021-02-01 NOTE — Progress Notes (Signed)
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 Associated postobstructive left lower lobe atelectasis/collapse. Small loculated left pleural effusion. --Postobstructive opacity versus perilymphatic spread of tumor in the central left upper lobe/perihilar region. 7 mm nodule in the medial left upper lobe, indeterminate, metastasis not excluded. --Thoracic nodal metastases, as above. --No evidence of metastatic disease in the abdomen/pelvis.  Location(s) of Symptomatic tumor(s):  MRI Brain w/ & w/o Contrast 01/17/2021 --IMPRESSION: Multiple enhancing masses within the brain, consistent with metastatic disease. The largest lesion is in the right temporal lobe with severe surrounding edema and 4 mm of leftward midline shift.  CT Head w/o Contrast 01/17/2021 --IMPRESSION: 1. Extensive white matter edema within the right hemisphere with about 5 mm midline shift to the left and mass effect on right lateral ventricle. Approximate 2.7 cm suspected mass in the right temporoparietal region concerning for metastatic focus or primary brain neoplasm. MRI with and without contrast is recommended for further evaluation.  Patient presented with symptoms of:  presented to her PCP with a right facial droop, right ptosis, headaches and some neck pain. 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 Head CT revealed a lesion in the left temporal lobe. [Dr. Marylyn Ishihara Cabbell] recommended a Chest, Abdomen, and pelvic CT be performed. That scan revealed a large left lower lobe mass. MRI revealed multiple intracerebral masses  01/19/2021 Dr. Leory Plowman Icard --Video Bronchoscopy, endobronchial cryotherapy, endobronchial cryo biopsies, tumor debulking and lesional excision procedure FINAL MICROSCOPIC DIAGNOSIS:  A. BRONCHUS, LEFT  MAINSTEM, BIOPSY:  -  Adenocarcinoma  -  See comment  COMMENT:  The neoplastic cells are positive for TTF-1 but negative for cytokeratin 5/6 and p40, supporting the diagnosis of adenocarcinoma **awaiting Guardant 360 results  01/18/2021 Genelle Bal (done in ED) --Thoracentesis FINAL MICROSCOPIC DIAGNOSIS:  Reactive mesothelial cells present  SPECIMEN ADEQUACY:  Satisfactory for evaluation   Past or anticipated interventions, if any, per neurosurgery:  Under care of Dr. Ashok Pall TBD after Brain Tumor [place] on 02/05/2021  Past/Anticipated chemotherapy by medical oncology, if any:  Under care of Dr. Narda Rutherford 01/29/2021 --discussed with Dr. Christella Noa in neurosurgery who recommended further review at Brain tumor [place]. --at this time will prepare patient for Carbo/Pem/Pem chemotherapy, which can be deferred or held if a mutation is noted or if another intervention (neurosurgery/RadOnc is required first). --Plan for port placement. --Guardant 360 ordered today.  Foundation 1 and PD-L1 immunohistochemistry are currently pending on her biopsied tissue --Return to clinic in approximately 2 weeks time for consideration of start of therapy  Dose of Decadron, if applicable: 6 mg PO every 6 hours  Recent neurologic symptoms, if any:  Seizures: Patient denies Headaches: Not currently, but reports she was experiencing severe headaches prior to her most recent hospitalization. States she was taking OTC Tylenol around the clock every 6 hours  Nausea: Patient denies  Dizziness/ataxia: Only when she changes position quickly (stands up too fast, squats down too quickly) Difficulty with hand coordination: Patient denies Focal numbness/weakness: Reports mild numbness to her left eye--states she feels like she has to blink on that side more frequently  Visual deficits/changes: Reports occasional blurry vision to her left eye as well as frequent watering of left eye Confusion/Memory deficits:  Patient denies  Pain on a scale of 0-10 is: Patient denies    Ambulatory status? Walker? Wheelchair?: Patient  has is ambulatory without assistance  SAFETY ISSUES: Prior radiation? No Pacemaker/ICD? No Possible current pregnancy? No--postmenopausal  Is the patient on methotrexate? No  Additional Complaints / other details:  Patient has received the first 3 Pfizer vaccines

## 2021-02-01 NOTE — Progress Notes (Signed)
Radiation Oncology         [phone] ________________________________  Initial Outpatient Consultation  Name: Loretta Woods MRN: [id number]  Date: 02/02/2021  DOB: 08-15-1955  VQ:[id number], Curt Jews, MD  Orson Slick, MD   REFERRING PHYSICIAN: Orson Slick, MD  DIAGNOSIS:     ICD-10-CM   1. 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 female who recently presented to the ED on 01/17/21 with a 3 week history of worsening right sided headache, right sided facial weakness, right sided ear pain, and severe hypertension (BP upon arrival to the ED was 207/94). Upon physical examination at the ED, the patient was unable to close her right eyelid completely and unable to raise her right eyebrow. (The patient was admitted).  Imaging conducted on 01/17/21 during the patient's hospital stay includes:  --CT of the chest abdomen and pelvis demonstrated: a 7.5 cm left lower lobe mass, compatible with primary bronchogenic neoplasm. There was also associated postobstructive left lower lobe atelectasis/collapse and small loculated left pleural effusion. In the central left upper lobe/perihilar region; postobstructive opacity versus perilymphatic spread of tumor were also seen. An indeterminate nodule noted in the medial left upper lobe measured 7 mm. Thoracic-nodal metastases were also seen. --CT of the head demonstrated: an approximate 2.7 cm suspected mass in the right temporoparietal region concerning for metastatic focus or primary brain neoplasm. Also seen was extensive white matter edema within the right hemisphere with about a 5 mm midline shift to the left and a mass effect to the right lateral ventricle. --MRI of  the brain further demonstrated: multiple enhancing masses within the brain; consistent with metastatic disease. The largest lesion is located in the right temporal lobe (measuring 3.6 x 2.7 cm) and has severe surrounding edema and 4 mm of leftward midline shift.  I have personally reviewed her images  On 01/18/21, the patient underwent thoracentesis; 200 cc's of clear-dark yellow appearing fluid was drained from the left pleural space. Cytology revealed the pleural fluid to contain reactive mesothelial cells.  On 01/19/21, the patient underwent bronchoscopy with endobrachial biopsies under Dr. Valeta Harms. Biopsy of the left mainstem bronchus reveled: adenocarcinoma.   The patient was discharged home on 01/19/21 following bronchoscopy. She was instructed to continue taking dexamethasone (decadron) which helped significantly improve her symptoms since she began taking it during admission.  The patient followed up with Dr. Lorenso Courier on 01/29/21. During this visit, the patient was noted to be doing relatively well and is continuing her steroid therapy (dexamethasone) without any major side effects. The patient stated that she was no longer having headaches though she still has some difficulty blinking her right eye. She denied having shortness of breath, cough, or trouble breathing. Given the patients recent findings, Dr. Lorenso Courier recommended for systemic therapy with carboplatin, pembrolizumab, and pemetrexed.  She has not yet had a PET scan.     She was seen by Dr. Christella Noa of neurosurgery but no concrete plans for resection.  She will be discussed at our CNS tumor board on July 25th.  She reports that her headaches and lightheadedness have been held by steroids.  She is taking 6 mg 4 times a day.  She reports significant insomnia and anxiety.  She denies any seizures or focal weakness or focal numbness.  She reports that she quit smoking several years ago.  She is here with her mother.  PREVIOUS RADIATION  THERAPY: No  PAST MEDICAL HISTORY:  has a past medical history of Hypertension and Thyroid disease.    PAST SURGICAL HISTORY: Past Surgical History:  Procedure Laterality Date   CRYOTHERAPY  01/19/2021   Procedure: CRYOTHERAPY;  Surgeon: Garner Nash, DO;  Location: [facility];  Service: Pulmonary;;   HEMOSTASIS CONTROL  01/19/2021   Procedure: HEMOSTASIS CONTROL;  Surgeon: Garner Nash, DO;  Location: [facility];  Service: Pulmonary;;   VIDEO BRONCHOSCOPY  01/19/2021   Procedure: VIDEO BRONCHOSCOPY WITHOUT FLUORO;  Surgeon: Garner Nash, DO;  Location: [facility];  Service: Pulmonary;;    FAMILY HISTORY: family history includes Bladder Cancer in her father.  SOCIAL HISTORY:  reports that she quit smoking about 10 years ago. Her smoking use included cigarettes. She smoked an average of 1 pack per day. She has never used smokeless tobacco. She reports that she does not drink alcohol and does not use drugs.  ALLERGIES: Patient has no known allergies.  MEDICATIONS:  Current Outpatient Medications  Medication Sig Dispense Refill   temazepam (RESTORIL) 22.5 MG capsule Take 1 capsule (22.5 mg total) by mouth at bedtime as needed for sleep. 30 capsule 0   acetaminophen (TYLENOL) 500 MG tablet Take 500 mg by mouth every 6 (six) hours as needed for moderate pain.     atorvastatin (LIPITOR) 10 MG tablet Take 10 mg by mouth daily.     cycloSPORINE (RESTASIS) 0.05 % ophthalmic emulsion Place 1 drop into both eyes 2 (two) times daily.     dexamethasone (DECADRON) 6 MG tablet Take 1 tablet (6 mg total) by mouth every 6 (six) hours. 120 tablet 0   EUTHYROX 75 MCG tablet Take 75 mcg by mouth daily.     hydrochlorothiazide (HYDRODIURIL) 50 MG tablet Take 25 mg by mouth daily.     levETIRAcetam (KEPPRA) 500 MG tablet Take 1 tablet (500 mg total) by mouth 2 (two) times daily. 60 tablet 0   Multiple Vitamins-Minerals (EMERGEN-C IMMUNE) PACK Take 1 packet by mouth daily as needed (immune  support).     No current facility-administered medications for this encounter.    REVIEW OF SYSTEMS:  As above.   PHYSICAL EXAM:  weight is 93 lb (42.2 kg). Her blood pressure is 194/88 (abnormal) and her pulse is 88. Her respiration is 19 and oxygen saturation is 100%.   General: Alert and oriented, in no acute distress  HEENT: Head is normocephalic. Extraocular movements are intact.   Her blinking is more pronounced in the left eye.  She has a slight right mouth droop.  No oral thrush Neck: Neck is supple, no palpable cervical or supraclavicular lymphadenopathy. Heart: Regular in rate and rhythm with no murmurs, rubs, or gallops. Chest: Clear to auscultation bilaterally, with no rhonchi, wheezes, or rales. Abdomen: Soft, nontender, nondistended, with no rigidity or guarding. Extremities: No cyanosis or edema.  Clubbing noted of the fingernails. Lymphatics: see Neck Exam Skin: Skin is very tanned Musculoskeletal: symmetric strength and muscle tone throughout. Neurologic:  Her blinking is more pronounced in the left eye.  She has a slight right mouth droop. Speech is fluent. Coordination is intact.  Object recall is 2 out of 3 at 5 minutes Psychiatric: Judgment and insight are intact. Affect is appropriate.  KPS =  80  100 - Normal; no complaints; no evidence of disease. 90   - Able to carry on normal activity; minor signs or symptoms of disease. 80   - Normal activity with effort; some signs or symptoms of disease. 58   - Cares for self; unable to carry on normal activity or to do active work. 60   - Requires occasional assistance, but is able to care for most of his personal needs. 50   - Requires considerable assistance and frequent medical care. 70   - Disabled; requires special care and assistance. 13   - Severely disabled; hospital admission is indicated although death not imminent. 49   - Very sick; hospital admission necessary; active supportive treatment necessary. 10   -  Moribund; fatal processes progressing rapidly. 0     - Dead  Karnofsky DA, Abelmann [place], Craver LS and Burchenal [facility] [phone]) The use of the nitrogen mustards in the palliative treatment of carcinoma: with particular reference to bronchogenic carcinoma Cancer 1 634-56  LABORATORY DATA:  Lab Results  Component Value Date   WBC 43.2 (H) 01/29/2021   HGB 15.9 (H) 01/29/2021   HCT 45.1 01/29/2021   MCV 84.3 01/29/2021   PLT 437 (H) 01/29/2021   CMP     Component Value Date/Time   NA 131 (L) 01/29/2021 1021   K 3.8 01/29/2021 1021   CL 92 (L) 01/29/2021 1021   CO2 29 01/29/2021 1021   GLUCOSE 108 (H) 01/29/2021 1021   BUN 27 (H) 01/29/2021 1021   CREATININE 0.71 01/29/2021 1021   CALCIUM 9.4 01/29/2021 1021   PROT 6.8 01/29/2021 1021   ALBUMIN 3.7 01/29/2021 1021   AST 15 01/29/2021 1021   ALT 34 01/29/2021 1021   ALKPHOS 95 01/29/2021 1021   BILITOT 0.6 01/29/2021 1021   GFRNONAA >60 01/29/2021 1021         RADIOGRAPHY: CT Head Wo Contrast  Result Date: 01/17/2021 CLINICAL DATA:  Mental status change EXAM: CT HEAD WITHOUT CONTRAST TECHNIQUE: Contiguous axial images were obtained from the base of the skull through the vertex without intravenous contrast. COMPARISON:  None. FINDINGS: Brain: Extensive hypodensity/edema within the right hemispheric white matter and basal ganglia. 2.7 x 1.9 by 2.3 cm suspected mass within the right temporoparietal region. About 5 mm midline shift to the left. Mass effect on the right lateral ventricle. Possible small chronic appearing infarct in the right cerebellum peripherally. Punctate cortical calcification at the left frontal lobe. Vascular: No hyperdense vessels.  No unexpected calcification. Skull: Normal. Negative for fracture or focal lesion. Sinuses/Orbits: No acute finding. Other: None. IMPRESSION: 1. Extensive white matter edema within the right hemisphere with about 5 mm midline shift to the left and mass effect on right lateral ventricle.  Approximate 2.7 cm suspected mass in the right temporoparietal region concerning for metastatic focus or primary brain neoplasm. MRI with and without contrast is recommended for further evaluation. Critical Value/emergent results were called by telephone at the time of interpretation on 01/17/2021 at 6:50 pm to provider Jake in the ED, patient in triage , who verbally acknowledged these results. Electronically Signed   By: Donavan Foil M.D.   On: 01/17/2021 18:51   MR BRAIN W WO CONTRAST  Result Date: 01/17/2021 CLINICAL DATA:  Brain mass EXAM: MRI HEAD WITHOUT AND WITH CONTRAST TECHNIQUE: Multiplanar, multiecho pulse sequences of the brain and surrounding structures were obtained without and with intravenous contrast. CONTRAST:  5.52mL GADAVIST GADOBUTROL 1 MMOL/ML IV SOLN COMPARISON:  Head  CT 01/17/2021 FINDINGS: Brain: There are multiple enhancing masses within the brain. The largest lesion is in the right temporal lobe. There are solid and necrotic components of this mass. The solid component measures approximately 3.6 x 2.7 cm may reflect a component of dural reaction. The necrotic portion measures approximately 4.0 x 2.0 cm. More superior right temporal mass measures 2.0 x 1.8 cm. There are subcentimeter lesions in both occipital lobes, the left temporal lobe, right parietal lobe and the cerebellum. The lesions are annotated on series 16. There is severe edema within the right temporal and parietal lobes extending into the area of the right internal and external capsules. There is petechial hemorrhage associated with the anterior right temporal lesion. There is 4 mm of leftward midline shift. Vascular: Major flow voids are preserved. Skull and upper cervical spine: Normal calvarium and skull base. Visualized upper cervical spine and soft tissues are normal. Sinuses/Orbits:No paranasal sinus fluid levels or advanced mucosal thickening. Small amount of mastoid fluid. Normal orbits. IMPRESSION: Multiple  enhancing masses within the brain, consistent with metastatic disease. The largest lesion is in the right temporal lobe with severe surrounding edema and 4 mm of leftward midline shift. Electronically Signed   By: Ulyses Jarred M.D.   On: 01/17/2021 23:31   CT CHEST ABDOMEN PELVIS W CONTRAST  Result Date: 01/17/2021 CLINICAL DATA:  Brain mass, staging EXAM: CT CHEST, ABDOMEN, AND PELVIS WITH CONTRAST TECHNIQUE: Multidetector CT imaging of the chest, abdomen and pelvis was performed following the standard protocol during bolus administration of intravenous contrast. CONTRAST:  141mL OMNIPAQUE IOHEXOL 300 MG/ML  SOLN COMPARISON:  None. FINDINGS: CT CHEST FINDINGS Cardiovascular: The heart is normal in size. No pericardial effusion. No evidence of thoracic aortic aneurysm. Mild atherosclerotic calcifications of the aortic arch. Mild coronary atherosclerosis of the LAD and left circumflex. Mediastinum/Nodes: Thoracic lymphadenopathy, including a 16 mm short axis AP window node (series 2/image 27) and a 16 mm short axis left perihilar node (series 2/image 34). Lungs/Pleura: 7.4 x 5.5 cm posteromedial left lower lobe mass (series 2/image 39), compatible with primary bronchogenic neoplasm. Associated postobstructive atelectasis/collapse involving the left lower lobe. Small loculated left pleural effusion. Additional interlobular septal thickening in the central left upper lobe/perihilar region (series 4/image 56), likely post obstructive due to nodal metastases, although perilymphatic spread of tumor is not excluded. 7 mm nodule in the medial left upper lobe (series 4/image 100). Moderate centrilobular and paraseptal emphysematous changes, upper lung predominant. No focal consolidation. No pneumothorax. Musculoskeletal: No focal osseous lesions. CT ABDOMEN PELVIS FINDINGS Hepatobiliary: Liver is within normal limits. No suspicious/enhancing hepatic lesions. Gallbladder is unremarkable. No intrahepatic or extrahepatic  ductal dilatation. Pancreas: Within normal limits. Spleen: Within normal limits. Adrenals/Urinary Tract: Adrenal glands are within normal limits. Kidneys are within normal limits.  No hydronephrosis. Bladder is within normal limits. Stomach/Bowel: Stomach is within normal limits. No evidence of bowel obstruction. Appendix is not discretely visualized. Vascular/Lymphatic: No evidence of abdominal aortic aneurysm. Atherosclerotic calcifications of the abdominal aorta and branch vessels. No suspicious abdominopelvic lymphadenopathy. Reproductive: Calcified uterine fibroids (series 2/image 101). Right ovary is notable for a 2.1 cm cystic lesion (series 2/image 97), likely benign. Left ovary is within normal limits. Other: No abdominopelvic ascites. Musculoskeletal: Mild degenerative changes of the lumbar spine. No focal osseous lesions. IMPRESSION: 7.5 cm left lower lobe mass, compatible with primary bronchogenic neoplasm. Associated postobstructive left lower lobe atelectasis/collapse. Small loculated left pleural effusion. Postobstructive opacity versus perilymphatic spread of tumor in the central left upper  lobe/perihilar region. 7 mm nodule in the medial left upper lobe, indeterminate, metastasis not excluded. Thoracic nodal metastases, as above. No evidence of metastatic disease in the abdomen/pelvis. Electronically Signed   By: Julian Hy M.D.   On: 01/17/2021 20:40   DG CHEST PORT 1 VIEW  Result Date: 01/18/2021 CLINICAL DATA:  Status post left-sided thoracentesis. EXAM: PORTABLE CHEST 1 VIEW COMPARISON:  CT chest from yesterday. FINDINGS: Normal heart size. Normal pulmonary vascularity. Resolved left pleural effusion. No pneumothorax. The lungs remain hyperinflated with emphysematous changes. Large mass in the medial left lower lobe grossly unchanged. Mild linear scarring in the lingula. The right lung is clear. No acute osseous abnormality. IMPRESSION: 1. Resolved left pleural effusion. No  pneumothorax. 2. Unchanged left lower lobe mass. Electronically Signed   By: Titus Dubin M.D.   On: 01/18/2021 10:20      IMPRESSION/PLAN: This is a very pleasant 65 year old female with metastatic disease to the brain.  She will be discussed at our tumor board in 3 days.  Because she has responded well to steroids, I believe that the risks of resecting the dominant brain mass would outweigh the benefits.  She is not enthusiastic about neurosurgery if this can be avoided.  I had a lengthy discussion with the patient after reviewing their MRI results with them.  We spoke about whole brain radiotherapy versus stereotactic radiosurgery to the brain. We spoke about the differing risks benefits and side effects of both of these treatments. During part of our discussion, we spoke about the hair loss, fatigue and cognitive effects that can result from whole brain radiotherapy.  Additionally, we spoke about radionecrosis that can result from stereotactic radiosurgery. I explained that whole brain radiotherapy is more comprehensive and therefore can decrease the chance of recurrences elsewhere in the brain, while stereotactic radiosurgery only treats the areas of gross disease while sparing the rest of the brain parenchyma.   I recommend that we proceed with a 3 Tesla MRI to verify if she has a limited number of brain metastases; if there are reasonable number of targets to avoid whole brain radiation therapy we will proceed with fractionated stereotactic radiosurgery over 5 treatments.  We will expedite the 3 Tesla MRI so the treatment planning can take place in the near future. She would like to proceed with stereotactic brain radiosurgery to their metastatic disease.  Consent form was signed for this and placed in her chart; we discussed potential side effects including but not necessarily limited to brain injury, hair loss, headache, radiation necrosis, fatigue.  She understands that if needed we will proceed  with whole brain radiation therapy, depending on the extent of metastases on her planning MRI.  Sent a message to Dr. Lorenso Courier to let him know that we can also give palliative radiation to her left lung to prevent further airway collapse.  However, I understand if he may want to follow the tumor in her lung to verify response to systemic therapy.  Defer to Dr. Lorenso Courier on staging with PET in future.  For her insomnia I have prescribed temazepam to help with sleep.  I believe her anxiety may be helped if she can get enough sleep at night.  I recommended that she decrease her dexamethasone to 6 mg 3 times a day.  On date of service, in total, I spent 65 minutes on this encounter. Patient was seen in person.   __________________________________________   Eppie Gibson, MD  This document serves as a record  of services personally performed by Eppie Gibson, MD. It was created on her behalf by Roney Mans, a trained medical scribe. The creation of this record is based on the scribe's personal observations and the provider's statements to them. This document has been checked and approved by the attending provider.

## 2021-02-02 ENCOUNTER — Other Ambulatory Visit: Payer: Self-pay

## 2021-02-02 ENCOUNTER — Ambulatory Visit
Admission: RE | Admit: 2021-02-02 | Discharge: 2021-02-02 | Disposition: A | Discharge status: Home / Self Care | Payer: 59 | Source: Ambulatory Visit | Attending: Radiation Oncology | Admitting: Radiation Oncology

## 2021-02-02 ENCOUNTER — Encounter: Payer: Self-pay | Admitting: Radiation Oncology

## 2021-02-02 ENCOUNTER — Other Ambulatory Visit: Payer: Self-pay | Admitting: Radiation Therapy

## 2021-02-02 ENCOUNTER — Encounter: Payer: Self-pay | Admitting: General Practice

## 2021-02-02 VITALS — BP 194/88 | HR 88 | Resp 19 | Wt 93.0 lb

## 2021-02-02 DIAGNOSIS — M47816 Spondylosis without myelopathy or radiculopathy, lumbar region: Secondary | ICD-10-CM | POA: Diagnosis not present

## 2021-02-02 DIAGNOSIS — J90 Pleural effusion, not elsewhere classified: Secondary | ICD-10-CM | POA: Insufficient documentation

## 2021-02-02 DIAGNOSIS — C771 Secondary and unspecified malignant neoplasm of intrathoracic lymph nodes: Secondary | ICD-10-CM | POA: Diagnosis not present

## 2021-02-02 DIAGNOSIS — C7949 Secondary malignant neoplasm of other parts of nervous system: Secondary | ICD-10-CM

## 2021-02-02 DIAGNOSIS — C3490 Malignant neoplasm of unspecified part of unspecified bronchus or lung: Secondary | ICD-10-CM

## 2021-02-02 DIAGNOSIS — I10 Essential (primary) hypertension: Secondary | ICD-10-CM | POA: Insufficient documentation

## 2021-02-02 DIAGNOSIS — R42 Dizziness and giddiness: Secondary | ICD-10-CM | POA: Insufficient documentation

## 2021-02-02 DIAGNOSIS — I2510 Atherosclerotic heart disease of native coronary artery without angina pectoris: Secondary | ICD-10-CM | POA: Insufficient documentation

## 2021-02-02 DIAGNOSIS — J432 Centrilobular emphysema: Secondary | ICD-10-CM | POA: Insufficient documentation

## 2021-02-02 DIAGNOSIS — Z87891 Personal history of nicotine dependence: Secondary | ICD-10-CM | POA: Diagnosis not present

## 2021-02-02 DIAGNOSIS — C3432 Malignant neoplasm of lower lobe, left bronchus or lung: Secondary | ICD-10-CM | POA: Diagnosis not present

## 2021-02-02 DIAGNOSIS — C7931 Secondary malignant neoplasm of brain: Secondary | ICD-10-CM

## 2021-02-02 DIAGNOSIS — G4700 Insomnia, unspecified: Secondary | ICD-10-CM | POA: Diagnosis not present

## 2021-02-02 DIAGNOSIS — F419 Anxiety disorder, unspecified: Secondary | ICD-10-CM | POA: Diagnosis not present

## 2021-02-02 DIAGNOSIS — Z79899 Other long term (current) drug therapy: Secondary | ICD-10-CM | POA: Insufficient documentation

## 2021-02-02 DIAGNOSIS — Z8052 Family history of malignant neoplasm of bladder: Secondary | ICD-10-CM | POA: Diagnosis not present

## 2021-02-02 DIAGNOSIS — Z7952 Long term (current) use of systemic steroids: Secondary | ICD-10-CM | POA: Diagnosis not present

## 2021-02-02 MED ORDER — DEXAMETHASONE 6 MG PO TABS: 6.0000 mg | ORAL_TABLET | Freq: Three times a day (TID) | ORAL | 0 refills | Status: DC

## 2021-02-02 MED ORDER — TEMAZEPAM 22.5 MG PO CAPS: 22.5000 mg | ORAL_CAPSULE | Freq: Every evening | ORAL | 0 refills | Status: DC | PRN

## 2021-02-02 NOTE — Progress Notes (Signed)
[facility] Psychosocial Distress Screening Clinical Social Work  Clinical Social Work was referred by distress screening protocol.  The patient scored a 9 on the Psychosocial Distress Thermometer which indicates severe distress. Clinical Social Worker contacted patient by phone to assess for distress and other psychosocial needs. "We found out more today, have scan on Tuesday, plan to start radiation soon."  "Its a lot to take in."  "Lets get this started and over with."  She was working prior to diagnosis, she does not plan to continue as it is physically and mentally challenging.  She is on Fish farm manager early retirement.  Lives w mother so housing expenses are limited.  She would like to be enrolled in [facility] and use it as she needs.  Worries include "just getting everything going, remembering everything."  "I just want it over and done."  Has anxiety and cannot sleep well.  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  "We take care of each other."  CSW and patient discussed common feeling and emotions when being diagnosed with cancer, and the importance of support during treatment.  CSW informed patient of the support team and support services at [facility].  CSW provided contact information and encouraged patient to call with any questions or concerns. Referred to [facility] [place].    ON[facility]N DISTRESS SCREENING 02/02/2021  Screening Type Initial Screening  Distress experienced in past week (1-10) 9  Emotional problem type Nervousness/Anxiety;Adjusting to illness  Physical Problem type Sleep/insomnia;Mouth sores/swallowing  Physician notified of physical symptoms Yes  Referral to clinical psychology No  Referral to clinical social work Yes  Referral to dietition No  Referral to financial advocate No  Referral to support programs Yes  Referral to palliative care No    Clinical Social Worker follow up needed: No.  If yes, follow up plan:  Beverely Pace, [place], LCSW Clinical Social Worker Phone:  [phone]

## 2021-02-05 ENCOUNTER — Telehealth (HOSPITAL_COMMUNITY): Payer: Self-pay | Admitting: Dietician

## 2021-02-05 NOTE — Telephone Encounter (Signed)
Nutrition  Patient identified on MST.  Attempted to contact patient via telephone to introduce self and services available at [facility]. Patient did not answer. Message left with request for return call. Contact information provided.

## 2021-02-06 ENCOUNTER — Ambulatory Visit
Admission: RE | Admit: 2021-02-06 | Discharge: 2021-02-06 | Disposition: A | Discharge status: Home / Self Care | Payer: 59 | Source: Ambulatory Visit | Attending: Radiation Oncology | Admitting: Radiation Oncology

## 2021-02-06 DIAGNOSIS — C7931 Secondary malignant neoplasm of brain: Secondary | ICD-10-CM

## 2021-02-06 MED ORDER — GADOBENATE DIMEGLUMINE 529 MG/ML IV SOLN
8.0000 mL | Freq: Once | INTRAVENOUS | Status: AC | PRN
  Administered 2021-02-06: 8 mL via INTRAVENOUS

## 2021-02-07 ENCOUNTER — Ambulatory Visit: Payer: 59 | Admitting: Radiation Oncology

## 2021-02-07 ENCOUNTER — Ambulatory Visit
Admission: RE | Admit: 2021-02-07 | Discharge: 2021-02-07 | Disposition: A | Discharge status: Home / Self Care | Payer: 59 | Source: Ambulatory Visit | Attending: Radiation Oncology | Admitting: Radiation Oncology

## 2021-02-07 ENCOUNTER — Other Ambulatory Visit: Payer: Self-pay

## 2021-02-07 ENCOUNTER — Encounter (HOSPITAL_COMMUNITY): Payer: Self-pay | Admitting: Hematology and Oncology

## 2021-02-07 ENCOUNTER — Other Ambulatory Visit: Payer: Self-pay | Admitting: Radiation Oncology

## 2021-02-07 VITALS — BP 186/93 | HR 83 | Temp 96.9°F | Resp 18 | Wt 94.4 lb

## 2021-02-07 DIAGNOSIS — C771 Secondary and unspecified malignant neoplasm of intrathoracic lymph nodes: Secondary | ICD-10-CM | POA: Insufficient documentation

## 2021-02-07 DIAGNOSIS — C7931 Secondary malignant neoplasm of brain: Secondary | ICD-10-CM

## 2021-02-07 DIAGNOSIS — C7949 Secondary malignant neoplasm of other parts of nervous system: Secondary | ICD-10-CM

## 2021-02-07 DIAGNOSIS — C3432 Malignant neoplasm of lower lobe, left bronchus or lung: Secondary | ICD-10-CM | POA: Diagnosis not present

## 2021-02-07 DIAGNOSIS — Z510 Encounter for antineoplastic radiation therapy: Secondary | ICD-10-CM | POA: Diagnosis not present

## 2021-02-07 DIAGNOSIS — C3492 Malignant neoplasm of unspecified part of left bronchus or lung: Secondary | ICD-10-CM

## 2021-02-07 MED ORDER — FLUCONAZOLE 100 MG PO TABS
ORAL_TABLET | ORAL | 0 refills | Status: DC
Start: 2021-02-07 — End: 2021-03-29

## 2021-02-07 MED ORDER — SODIUM CHLORIDE 0.9% FLUSH
10.0000 mL | Freq: Once | INTRAVENOUS | Status: AC
  Administered 2021-02-07: 10 mL via INTRAVENOUS

## 2021-02-07 NOTE — Progress Notes (Signed)
Has armband been applied?  Yes.    Does patient have an allergy to IV contrast dye?: No.   Has patient ever received premedication for IV contrast dye?: No.   Does patient take metformin?: No.  Date of lab work: January 29, 2021 BUN: 27 CR: 0.71 eGFR: >60  IV site: antecubital left, condition patent and no redness  Has IV site been added to flowsheet?  Yes.    BP (!) 186/93 (BP Location: Left Arm, Patient Position: Sitting)   Pulse 83   Temp (!) 96.9 F (36.1 C) (Temporal)   Resp 18   Wt 94 lb 6 oz (42.8 kg)   SpO2 100%   BMI 17.26 kg/m

## 2021-02-10 NOTE — Progress Notes (Signed)
Today I discussed the MRI brain results w/ Ms Loretta Woods and recommended whole brain RT with hippocampal sparing due to the extent of disease. We will also treat her left lung dominant adenopathy/tumor for local control (Dr. Lorenso Courier and I discussed this and agree it is in her best interest). Anticipate 10 fractions to both sites. She is agreeable, and consent was reviewed in detail and signed.  -----------------------------------  Eppie Gibson, MD

## 2021-02-12 ENCOUNTER — Ambulatory Visit: Payer: 59 | Admitting: Radiation Oncology

## 2021-02-12 ENCOUNTER — Ambulatory Visit
Admission: RE | Admit: 2021-02-12 | Discharge: 2021-02-12 | Disposition: A | Discharge status: Home / Self Care | Payer: 59 | Source: Ambulatory Visit | Attending: Radiation Oncology | Admitting: Radiation Oncology

## 2021-02-12 ENCOUNTER — Other Ambulatory Visit: Payer: Self-pay

## 2021-02-12 ENCOUNTER — Inpatient Hospital Stay (HOSPITAL_BASED_OUTPATIENT_CLINIC_OR_DEPARTMENT_OTHER): Payer: 59 | Admitting: Hematology and Oncology

## 2021-02-12 ENCOUNTER — Encounter: Payer: Self-pay | Admitting: Hematology and Oncology

## 2021-02-12 ENCOUNTER — Other Ambulatory Visit: Payer: Self-pay | Admitting: Hematology and Oncology

## 2021-02-12 ENCOUNTER — Inpatient Hospital Stay: Payer: 59

## 2021-02-12 VITALS — BP 184/104 | HR 80 | Temp 98.0°F | Resp 18 | Wt 94.5 lb

## 2021-02-12 DIAGNOSIS — Z7952 Long term (current) use of systemic steroids: Secondary | ICD-10-CM | POA: Insufficient documentation

## 2021-02-12 DIAGNOSIS — Z510 Encounter for antineoplastic radiation therapy: Secondary | ICD-10-CM | POA: Diagnosis not present

## 2021-02-12 DIAGNOSIS — Z7189 Other specified counseling: Secondary | ICD-10-CM

## 2021-02-12 DIAGNOSIS — C3432 Malignant neoplasm of lower lobe, left bronchus or lung: Secondary | ICD-10-CM | POA: Insufficient documentation

## 2021-02-12 DIAGNOSIS — J910 Malignant pleural effusion: Secondary | ICD-10-CM | POA: Insufficient documentation

## 2021-02-12 DIAGNOSIS — I10 Essential (primary) hypertension: Secondary | ICD-10-CM | POA: Diagnosis not present

## 2021-02-12 DIAGNOSIS — Z87891 Personal history of nicotine dependence: Secondary | ICD-10-CM | POA: Insufficient documentation

## 2021-02-12 DIAGNOSIS — Z8052 Family history of malignant neoplasm of bladder: Secondary | ICD-10-CM | POA: Insufficient documentation

## 2021-02-12 DIAGNOSIS — G9389 Other specified disorders of brain: Secondary | ICD-10-CM

## 2021-02-12 DIAGNOSIS — C771 Secondary and unspecified malignant neoplasm of intrathoracic lymph nodes: Secondary | ICD-10-CM | POA: Insufficient documentation

## 2021-02-12 DIAGNOSIS — C3492 Malignant neoplasm of unspecified part of left bronchus or lung: Secondary | ICD-10-CM

## 2021-02-12 DIAGNOSIS — C7931 Secondary malignant neoplasm of brain: Secondary | ICD-10-CM | POA: Diagnosis not present

## 2021-02-12 DIAGNOSIS — R634 Abnormal weight loss: Secondary | ICD-10-CM | POA: Insufficient documentation

## 2021-02-12 DIAGNOSIS — Z5111 Encounter for antineoplastic chemotherapy: Secondary | ICD-10-CM | POA: Insufficient documentation

## 2021-02-12 LAB — CBC WITH DIFFERENTIAL (CANCER CENTER ONLY)
Abs Immature Granulocytes: 1.52 10*3/uL — ABNORMAL HIGH (ref 0.00–0.07)
Basophils Absolute: 0.1 10*3/uL (ref 0.0–0.1)
Basophils Relative: 0 %
Eosinophils Absolute: 0 10*3/uL (ref 0.0–0.5)
Eosinophils Relative: 0 %
HCT: 41 % (ref 36.0–46.0)
Hemoglobin: 14.1 g/dL (ref 12.0–15.0)
Immature Granulocytes: 4 %
Lymphocytes Relative: 2 %
Lymphs Abs: 0.5 10*3/uL — ABNORMAL LOW (ref 0.7–4.0)
MCH: 29.5 pg (ref 26.0–34.0)
MCHC: 34.4 g/dL (ref 30.0–36.0)
MCV: 85.8 fL (ref 80.0–100.0)
Monocytes Absolute: 1.5 10*3/uL — ABNORMAL HIGH (ref 0.1–1.0)
Monocytes Relative: 4 %
Neutro Abs: 32.8 10*3/uL — ABNORMAL HIGH (ref 1.7–7.7)
Neutrophils Relative %: 90 %
Platelet Count: 303 10*3/uL (ref 150–400)
RBC: 4.78 MIL/uL (ref 3.87–5.11)
RDW: 13.2 % (ref 11.5–15.5)
WBC Count: 36.5 10*3/uL — ABNORMAL HIGH (ref 4.0–10.5)
nRBC: 0 % (ref 0.0–0.2)

## 2021-02-12 LAB — CMP (CANCER CENTER ONLY)
ALT: 40 U/L (ref 0–44)
AST: 12 U/L — ABNORMAL LOW (ref 15–41)
Albumin: 3.3 g/dL — ABNORMAL LOW (ref 3.5–5.0)
Alkaline Phosphatase: 78 U/L (ref 38–126)
Anion gap: 9 (ref 5–15)
BUN: 22 mg/dL (ref 8–23)
CO2: 28 mmol/L (ref 22–32)
Calcium: 8.9 mg/dL (ref 8.9–10.3)
Chloride: 93 mmol/L — ABNORMAL LOW (ref 98–111)
Creatinine: 0.67 mg/dL (ref 0.44–1.00)
GFR, Estimated: >60 mL/min (ref >60)
Glucose, Bld: 126 mg/dL — ABNORMAL HIGH (ref 70–99)
Potassium: 3.4 mmol/L — ABNORMAL LOW (ref 3.5–5.1)
Sodium: 130 mmol/L — ABNORMAL LOW (ref 135–145)
Total Bilirubin: 0.6 mg/dL (ref 0.3–1.2)
Total Protein: 5.8 g/dL — ABNORMAL LOW (ref 6.5–8.1)

## 2021-02-12 MED ORDER — FOLIC ACID 1 MG PO TABS: 1.0000 mg | ORAL_TABLET | Freq: Every day | ORAL | 3 refills | Status: AC

## 2021-02-12 MED ORDER — LIDOCAINE-PRILOCAINE 2.5-2.5 % EX CREA: 1.0000 "application " | TOPICAL_CREAM | CUTANEOUS | 0 refills | Status: AC | PRN

## 2021-02-12 MED ORDER — PROCHLORPERAZINE MALEATE 10 MG PO TABS: 10.0000 mg | ORAL_TABLET | Freq: Four times a day (QID) | ORAL | 0 refills | Status: AC | PRN

## 2021-02-12 MED ORDER — ONDANSETRON HCL 8 MG PO TABS: 8.0000 mg | ORAL_TABLET | Freq: Three times a day (TID) | ORAL | 0 refills | Status: AC | PRN

## 2021-02-12 NOTE — Progress Notes (Signed)
START ON PATHWAY REGIMEN - Non-Small Cell Lung     A cycle is every 21 days:     Pembrolizumab      Pemetrexed      Carboplatin   **Always confirm dose/schedule in your pharmacy ordering system**  Patient Characteristics: Stage IV Metastatic, Nonsquamous, Molecular Analysis Completed, Molecular Alteration Present and Targeted Therapy Exhausted OR EGFR Exon 20+ or KRAS G12C+ Present and No Prior Chemo/Immunotherapy OR No Alteration Present, Initial  Chemotherapy/Immunotherapy, PS = 0, 1, No Alteration Present, No Alteration Present, Candidate for Immunotherapy, PD-L1 Expression Positive  ? 50% (TPS) and Immunotherapy Candidate Therapeutic Status: Stage IV Metastatic Histology: Nonsquamous Cell Broad Molecular Profiling Status: Engineer, manufacturing Analysis Results: No Alteration Present ECOG Performance Status: 0 Chemotherapy/Immunotherapy Line of Therapy: Initial Chemotherapy/Immunotherapy EGFR Exons 18-21 Mutation Testing Status: Completed and Negative ALK Fusion/Rearrangement Testing Status: Completed and Negative BRAF V600 Mutation Testing Status: Completed and Negative KRAS G12C Mutation Testing Status: Completed and Negative MET Exon 14 Mutation Testing Status: Completed and Negative RET Fusion/Rearrangement Testing Status: Completed and Negative NTRK Fusion/Rearrangement Testing Status: Completed and Negative ROS1 Fusion/Rearrangement Testing Status: Completed and Negative Immunotherapy Candidate Status: Candidate for Immunotherapy PD-L1 Expression Status: PD-L1 Positive ? 50% (TPS) Intent of Therapy: Non-Curative / Palliative Intent, Discussed with Patient

## 2021-02-12 NOTE — Progress Notes (Signed)
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  CT head revealed a right temporoparietal mass with associated vasogenic edema.  MRI brain showed multiple enhancing masses consistent with metastatic disease.  The largest lesion is the right temporal lobe with severe surrounding edema and 4 mm leftward midline shift.  CT chest abdomen pelvis with contrast showed a 7.5 cm left lower lobe mass compatible with primary bronchogenic neoplasm and small loculated left pleural effusion. 01/18/2021: establish care with Dr. Lorenso Courier 01/19/2021: bronchoscopy performed, final pathology consistent with Adenocarcinoma of the lung. NGS testing showed TPS score 50%.  02/12/2021: start of whole brain radiation/palliative radiation to the lung.   Interval History:  Loretta Woods 65 y.o. female with medical history significant for metastatic adenocarcinoma of the lung who presents for a follow up visit. The patient was last seen while in house from 01/29/2021 at time of diagnosis. In the interim since the last visit she has continued on dexamethasone therapy.   On exam today Loretta Woods reports she has been well in the interim since her last visit.  She notes that she is not having any issues with headache or pain.  The only neurological issue she has is the slow blinking of her right eyelid.  She notes that her weight is increasing and her appetite is quite good.  She does energy is "okay".  She is concerned about the swelling she is having in her ankles noting that they are more difficult to bend.  She otherwise denies any fevers, chills, sweats, nausea, vomit or  diarrhea.  She denies any shortness of breath or cough.  A full 10 point ROS is listed below.  A full 10 point ROS is listed below.  Today we discussed the steps moving forward including radiation, port placement, and likely start date of chemotherapy.  MEDICAL HISTORY:  Past Medical History:  Diagnosis Date   Hypertension    Thyroid disease     SURGICAL HISTORY: Past Surgical History:  Procedure Laterality Date   CRYOTHERAPY  01/19/2021   Procedure: CRYOTHERAPY;  Surgeon: Garner Nash, DO;  Location: [facility];  Service: Pulmonary;;   HEMOSTASIS CONTROL  01/19/2021   Procedure: HEMOSTASIS CONTROL;  Surgeon: Garner Nash, DO;  Location: [facility];  Service: Pulmonary;;   VIDEO BRONCHOSCOPY  01/19/2021   Procedure: VIDEO BRONCHOSCOPY WITHOUT FLUORO;  Surgeon: Garner Nash, DO;  Location: [facility];  Service: Pulmonary;;    SOCIAL HISTORY: Social History   Socioeconomic History   Marital status: Divorced    Spouse name: Not on file   Number of children: 0   Years of education: Not on file   Highest education level: Not on file  Occupational History   Not on file  Tobacco Use   Smoking status: Former    Packs/day: 1.00    Types: Cigarettes    Quit date: 2012    Years since quitting: 10.5   Smokeless tobacco: Never  Vaping Use   Vaping Use: Every day  Substance and Sexual Activity   Alcohol use: Never   Drug use: Never   Sexual activity: Not on file  Other Topics Concern   Not on file  Social History Narrative  Not on file   Social Determinants of Health   Financial Resource Strain: Low Risk    Difficulty of Paying Living Expenses: Not hard at all  Food Insecurity: No Food Insecurity   Worried About Charity fundraiser in the Last Year: Never true   [place] in the Last Year: Never true  Transportation Needs: No Transportation Needs   Lack of Transportation (Medical): No   Lack of Transportation (Non-Medical): No  Physical Activity: Not  on file  Stress: Not on file  Social Connections: Not on file  Intimate Partner Violence: Not on file    FAMILY HISTORY: Family History  Problem Relation Age of Onset   Bladder Cancer Father     ALLERGIES:  has No Known Allergies.  MEDICATIONS:  Current Outpatient Medications  Medication Sig Dispense Refill   folic acid (FOLVITE) 1 MG tablet Take 1 tablet (1 mg total) by mouth daily. 90 tablet 3   lidocaine-prilocaine (EMLA) cream Apply 1 application topically as needed. 30 g 0   ondansetron (ZOFRAN) 8 MG tablet Take 1 tablet (8 mg total) by mouth every 8 (eight) hours as needed. 30 tablet 0   prochlorperazine (COMPAZINE) 10 MG tablet Take 1 tablet (10 mg total) by mouth every 6 (six) hours as needed for nausea or vomiting. 30 tablet 0   acetaminophen (TYLENOL) 500 MG tablet Take 500 mg by mouth every 6 (six) hours as needed for moderate pain.     atorvastatin (LIPITOR) 10 MG tablet Take 10 mg by mouth daily.     cycloSPORINE (RESTASIS) 0.05 % ophthalmic emulsion Place 1 drop into both eyes 2 (two) times daily.     dexamethasone (DECADRON) 6 MG tablet Take 1 tablet (6 mg total) by mouth 3 (three) times daily. 90 tablet 0   EUTHYROX 75 MCG tablet Take 75 mcg by mouth daily.     fluconazole (DIFLUCAN) 100 MG tablet Hold Atorvastatin while on this medication. Take 2 tablets today, then 1 tablet daily x 20 more days. 22 tablet 0   hydrochlorothiazide (HYDRODIURIL) 50 MG tablet Take 25 mg by mouth daily.     levETIRAcetam (KEPPRA) 500 MG tablet Take 1 tablet (500 mg total) by mouth 2 (two) times daily. 60 tablet 0   Multiple Vitamins-Minerals (EMERGEN-C IMMUNE) PACK Take 1 packet by mouth daily as needed (immune support).     temazepam (RESTORIL) 22.5 MG capsule Take 1 capsule (22.5 mg total) by mouth at bedtime as needed for sleep. 30 capsule 0   No current facility-administered medications for this visit.    REVIEW OF SYSTEMS:   Constitutional: ( - ) fevers, ( - )  chills , ( - )  night sweats Eyes: ( - ) blurriness of vision, ( - ) double vision, ( - ) watery eyes Ears, nose, mouth, throat, and face: ( - ) mucositis, ( - ) sore throat Respiratory: ( - ) cough, ( - ) dyspnea, ( - ) wheezes Cardiovascular: ( - ) palpitation, ( - ) chest discomfort, ( - ) lower extremity swelling Gastrointestinal:  ( - ) nausea, ( - ) heartburn, ( - ) change in bowel habits Skin: ( - ) abnormal skin rashes Lymphatics: ( - ) new lymphadenopathy, ( - ) easy bruising Neurological: ( - ) numbness, ( - ) tingling, ( - ) new weaknesses Behavioral/Psych: ( - ) mood change, ( - ) new changes  All other systems were reviewed with the patient and are negative.  PHYSICAL EXAMINATION: ECOG PERFORMANCE STATUS: 1 - Symptomatic but completely ambulatory  Vitals:   02/12/21 1036 02/12/21 1103  BP: (!) 211/109 (!) 184/104  Pulse: 88 80  Resp: 18   Temp: 98 F (36.7 C)   SpO2: 97%     Filed Weights   02/12/21 1036  Weight: 94 lb 8 oz (42.9 kg)     GENERAL: Well-appearing middle-age Caucasian female, alert, no distress and comfortable SKIN: skin color, texture, turgor are normal, no rashes or significant lesions EYES: conjunctiva are pink and non-injected, sclera clear LUNGS: clear to auscultation and percussion with normal breathing effort HEART: regular rate & rhythm and no murmurs and no lower extremity edema PSYCH: alert & oriented x 3, fluent speech NEURO: no focal motor/sensory deficits  LABORATORY DATA:  I have reviewed the data as listed CBC Latest Ref Rng & Units 02/12/2021 01/29/2021 01/17/2021  WBC 4.0 - 10.5 K/uL 36.5(H) 43.2(H) 9.7  Hemoglobin 12.0 - 15.0 g/dL 14.1 15.9(H) 12.3  Hematocrit 36.0 - 46.0 % 41.0 45.1 36.6  Platelets 150 - 400 K/uL 303 437(H) 344    CMP Latest Ref Rng & Units 02/12/2021 01/29/2021 01/18/2021  Glucose 70 - 99 mg/dL 126(H) 108(H) 133(H)  BUN 8 - 23 mg/dL 22 27(H) 13  Creatinine 0.44 - 1.00 mg/dL 0.67 0.71 0.62  Sodium 135 - 145 mmol/L 130(L) 131(L)  134(L)  Potassium 3.5 - 5.1 mmol/L 3.4(L) 3.8 4.4  Chloride 98 - 111 mmol/L 93(L) 92(L) 95(L)  CO2 22 - 32 mmol/L '28 29 26  ' Calcium 8.9 - 10.3 mg/dL 8.9 9.4 10.2  Total Protein 6.5 - 8.1 g/dL 5.8(L) 6.8 -  Total Bilirubin 0.3 - 1.2 mg/dL 0.6 0.6 -  Alkaline Phos 38 - 126 U/L 78 95 -  AST 15 - 41 U/L 12(L) 15 -  ALT 0 - 44 U/L 40 34 -   RADIOGRAPHIC STUDIES: I have personally reviewed the radiological images as listed and agreed with the findings in the report: Large left lower lobe lung mass with pleural effusion.  Review of MRI shows multiple brain lesions consistent with metastatic disease. CT Head Wo Contrast  Result Date: 01/17/2021 CLINICAL DATA:  Mental status change EXAM: CT HEAD WITHOUT CONTRAST TECHNIQUE: Contiguous axial images were obtained from the base of the skull through the vertex without intravenous contrast. COMPARISON:  None. FINDINGS: Brain: Extensive hypodensity/edema within the right hemispheric white matter and basal ganglia. 2.7 x 1.9 by 2.3 cm suspected mass within the right temporoparietal region. About 5 mm midline shift to the left. Mass effect on the right lateral ventricle. Possible small chronic appearing infarct in the right cerebellum peripherally. Punctate cortical calcification at the left frontal lobe. Vascular: No hyperdense vessels.  No unexpected calcification. Skull: Normal. Negative for fracture or focal lesion. Sinuses/Orbits: No acute finding. Other: None. IMPRESSION: 1. Extensive white matter edema within the right hemisphere with about 5 mm midline shift to the left and mass effect on right lateral ventricle. Approximate 2.7 cm suspected mass in the right temporoparietal region concerning for metastatic focus or primary brain neoplasm. MRI with and without contrast is recommended for further evaluation. Critical Value/emergent results were called by telephone at the time of interpretation on 01/17/2021 at 6:50 pm to provider Jake in the ED, patient in triage ,  who verbally acknowledged these results. Electronically Signed   By: Donavan Foil M.D.   On: 01/17/2021 18:51   MR Brain W Wo Contrast  Addendum Date: 02/06/2021   ADDENDUM REPORT: 02/06/2021  19:18 ADDENDUM: These results will be called to the ordering clinician or representative by the Radiologist Assistant, and communication documented in the PACS or [employer]. Electronically Signed   By: Margaretha Sheffield MD   On: 02/06/2021 19:18   Addendum Date: 02/06/2021   ADDENDUM REPORT: 02/06/2021 18:46 ADDENDUM: In addition to the areas described in impression #1, there also are multiple areas of restricted diffusion in the superior left cerebellum that likely represent acute/subacute infarcts. Electronically Signed   By: Margaretha Sheffield MD   On: 02/06/2021 18:46   Result Date: 02/06/2021 CLINICAL DATA:  Brain/CNS neoplasm.  Treatment planning. EXAM: MRI HEAD WITHOUT AND WITH CONTRAST TECHNIQUE: Multiplanar, multiecho pulse sequences of the brain and surrounding structures were obtained without and with intravenous contrast. CONTRAST:  82m MULTIHANCE GADOBENATE DIMEGLUMINE 529 MG/ML IV SOLN COMPARISON:  MRI 01/17/2021. FINDINGS: Brain: New areas of restricted diffusion in the left cerebellum, left pons, bilateral thalami, left frontal and bilateral parieto-occipital cortex. No hydrocephalus, acute hemorrhage, extra-axial fluid collection. Numerous enhancing metastatic lesions, annotated and described on series 14. Possible new lesions: *An ill-defined area of enhancement in the left cerebellum as restricted diffusion and may represent infarct or metastatic disease. *New ill-defined enhancement along the left tentorial leaflet (64), potentially a dural metastasis. *Multiple punctate lesions in the more inferior occipital lobe and in the right parieto-occipital region (images 80, 75) which may be new. *Punctate lesion in the anterior left frontal cortex (95) is more conspicuous on this study, but maybe  present on the prior. *Punctate lesion in the lateral left temporal lobe, likely new. *The enhancing lesion in the left occipital of (59), likely new. *Punctate lesion and the anterior left temporal lobe (86), likely new. *Punctate lesion in the inferior right cerebellum (29), likely new. Similar or increased lesions: *Approximately 20 mm lesion in the anterolateral right temporal of (78), not substantially changed *Approximately 46 mm lesion in the more posterior right temporal lobe (68) with central necrosis and extension inferiorly along the petrous temporal bone, suggesting possible dural extension. *5 mm lesion in the high left parasagittal frontal lobe is increased in size (119). *Wispy lesion in the more medial anterior left frontal lobe is slightly increased in bulk (97). *Punctate lesion in the right parieto-occipital region (92) is similar versus slightly more conspicuous. *4 mm lesion in the posterior and lateral right temporal lobe (86), likely slightly increased. *Punctate lesion in the more lateral left cerebellum (42) appears similar. *Punctate lesion in the anterior right frontal lobe (88), likely similar. *5 mm lesion in the left temporal occipital region (61) appears similar versus slightly increased. *Approximately 6 mm lesions in bilateral occipital lobes (74) similar versus slightly increased. Edema and mass effect associated with the dominant lesions in the right temporal lobe is improved with decreased leftward midline shift, now 3 mm at the foramen of [place]. Vascular: Major arterial flow voids are maintained at the skull base. Skull and upper cervical spine: Normal marrow signal. Degenerative changes of the upper cervical spine. Sinuses/Orbits: Clear sinuses.  Unremarkable orbits. Other: Small left mastoid effusion. IMPRESSION: 1. New areas of restricted diffusion in the left pons, bilateral thalami, left frontal and bilateral parieto-occipital cortex, concerning for acute or early subacute  infarcts. Some of the areas (including an additional ill-defined area of enhancement and restricted diffusion in the left cerebellum) may represent enhancing infarct or metastatic disease. These findings warrant attention on short interval follow-up to differentiate evolving infarct from metastasis. 2. Redemonstrated numerous infratentorial and supratentorial metastases, including suspected  new metastases and likely slightly increased size/conspicuity of many of the metastases, as detailed above. The increased conspicuity of some of the lesions may be in part due to improved technique on this study. The largest lesion in the right temporal lobe and extends along the petrous temporal bone inferiorly. 3. Edema and mass effect associated with the dominant lesions in the right temporal lobe is improved with decreased leftward midline shift, now 3 mm at the foramen of [place]. Electronically Signed: By: Margaretha Sheffield MD On: 02/06/2021 18:27   MR BRAIN W WO CONTRAST  Result Date: 01/17/2021 CLINICAL DATA:  Brain mass EXAM: MRI HEAD WITHOUT AND WITH CONTRAST TECHNIQUE: Multiplanar, multiecho pulse sequences of the brain and surrounding structures were obtained without and with intravenous contrast. CONTRAST:  5.56m GADAVIST GADOBUTROL 1 MMOL/ML IV SOLN COMPARISON:  Head CT 01/17/2021 FINDINGS: Brain: There are multiple enhancing masses within the brain. The largest lesion is in the right temporal lobe. There are solid and necrotic components of this mass. The solid component measures approximately 3.6 x 2.7 cm may reflect a component of dural reaction. The necrotic portion measures approximately 4.0 x 2.0 cm. More superior right temporal mass measures 2.0 x 1.8 cm. There are subcentimeter lesions in both occipital lobes, the left temporal lobe, right parietal lobe and the cerebellum. The lesions are annotated on series 16. There is severe edema within the right temporal and parietal lobes extending into the area of  the right internal and external capsules. There is petechial hemorrhage associated with the anterior right temporal lesion. There is 4 mm of leftward midline shift. Vascular: Major flow voids are preserved. Skull and upper cervical spine: Normal calvarium and skull base. Visualized upper cervical spine and soft tissues are normal. Sinuses/Orbits:No paranasal sinus fluid levels or advanced mucosal thickening. Small amount of mastoid fluid. Normal orbits. IMPRESSION: Multiple enhancing masses within the brain, consistent with metastatic disease. The largest lesion is in the right temporal lobe with severe surrounding edema and 4 mm of leftward midline shift. Electronically Signed   By: KUlyses JarredM.D.   On: 01/17/2021 23:31   CT CHEST ABDOMEN PELVIS W CONTRAST  Result Date: 01/17/2021 CLINICAL DATA:  Brain mass, staging EXAM: CT CHEST, ABDOMEN, AND PELVIS WITH CONTRAST TECHNIQUE: Multidetector CT imaging of the chest, abdomen and pelvis was performed following the standard protocol during bolus administration of intravenous contrast. CONTRAST:  1052mOMNIPAQUE IOHEXOL 300 MG/ML  SOLN COMPARISON:  None. FINDINGS: CT CHEST FINDINGS Cardiovascular: The heart is normal in size. No pericardial effusion. No evidence of thoracic aortic aneurysm. Mild atherosclerotic calcifications of the aortic arch. Mild coronary atherosclerosis of the LAD and left circumflex. Mediastinum/Nodes: Thoracic lymphadenopathy, including a 16 mm short axis AP window node (series 2/image 27) and a 16 mm short axis left perihilar node (series 2/image 34). Lungs/Pleura: 7.4 x 5.5 cm posteromedial left lower lobe mass (series 2/image 39), compatible with primary bronchogenic neoplasm. Associated postobstructive atelectasis/collapse involving the left lower lobe. Small loculated left pleural effusion. Additional interlobular septal thickening in the central left upper lobe/perihilar region (series 4/image 56), likely post obstructive due to nodal  metastases, although perilymphatic spread of tumor is not excluded. 7 mm nodule in the medial left upper lobe (series 4/image 100). Moderate centrilobular and paraseptal emphysematous changes, upper lung predominant. No focal consolidation. No pneumothorax. Musculoskeletal: No focal osseous lesions. CT ABDOMEN PELVIS FINDINGS Hepatobiliary: Liver is within normal limits. No suspicious/enhancing hepatic lesions. Gallbladder is unremarkable. No intrahepatic or extrahepatic ductal dilatation. Pancreas:  Within normal limits. Spleen: Within normal limits. Adrenals/Urinary Tract: Adrenal glands are within normal limits. Kidneys are within normal limits.  No hydronephrosis. Bladder is within normal limits. Stomach/Bowel: Stomach is within normal limits. No evidence of bowel obstruction. Appendix is not discretely visualized. Vascular/Lymphatic: No evidence of abdominal aortic aneurysm. Atherosclerotic calcifications of the abdominal aorta and branch vessels. No suspicious abdominopelvic lymphadenopathy. Reproductive: Calcified uterine fibroids (series 2/image 101). Right ovary is notable for a 2.1 cm cystic lesion (series 2/image 97), likely benign. Left ovary is within normal limits. Other: No abdominopelvic ascites. Musculoskeletal: Mild degenerative changes of the lumbar spine. No focal osseous lesions. IMPRESSION: 7.5 cm left lower lobe mass, compatible with primary bronchogenic neoplasm. Associated postobstructive left lower lobe atelectasis/collapse. Small loculated left pleural effusion. Postobstructive opacity versus perilymphatic spread of tumor in the central left upper lobe/perihilar region. 7 mm nodule in the medial left upper lobe, indeterminate, metastasis not excluded. Thoracic nodal metastases, as above. No evidence of metastatic disease in the abdomen/pelvis. Electronically Signed   By: Julian Hy M.D.   On: 01/17/2021 20:40   DG CHEST PORT 1 VIEW  Result Date: 01/18/2021 CLINICAL DATA:  Status  post left-sided thoracentesis. EXAM: PORTABLE CHEST 1 VIEW COMPARISON:  CT chest from yesterday. FINDINGS: Normal heart size. Normal pulmonary vascularity. Resolved left pleural effusion. No pneumothorax. The lungs remain hyperinflated with emphysematous changes. Large mass in the medial left lower lobe grossly unchanged. Mild linear scarring in the lingula. The right lung is clear. No acute osseous abnormality. IMPRESSION: 1. Resolved left pleural effusion. No pneumothorax. 2. Unchanged left lower lobe mass. Electronically Signed   By: Titus Dubin M.D.   On: 01/18/2021 10:20    ASSESSMENT & PLAN Loretta Woods 65 y.o. female with medical history significant for metastatic adenocarcinoma of the lung who presents for a follow up visit.  After review of the labs, review of the records, discussion with the patient the findings most consistent with metastatic adenocarcinoma of the lung with spread to the brain.  There is also an associated malignant pleural effusion.  Tap of the pleural effusion did not reveal cancer cells, though these are often quite low yield.  Given these findings I do believe the patient will require systemic therapy.  We are currently planning to start her on carboplatin, pembrolizumab, and pemetrexed.  We will move forward this, however if the NGS panel returns showing a targetable mutation we will alter the treatment course.    The patient has connected with Dr. Isidore Moos and radiation oncology and will be proceeding with palliative radiation to the lung mass and whole brain radiation.  This will proceed forward 2 weeks time.  After that we will have her return to clinic for the start of systemic chemotherapy.  Her guardant 360 is currently pending and if there are mutations found on that panel we could alter her treatment plan to oral medication for targeted mutations.  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 Isidore Moos. This will start today 02/12/2021. --planning for for Carbo/Pem/Pem chemotherapy, which can be deferred or held if a mutation is noted (TPS 50%, Guardant 360 pending) --Plan for port placement. --Guardant 360 pending.  Foundation 1 showed a PD-L1 score of 50%.  --Return to clinic on 03/05/2021 for start of chemotherapy.  #Supportive Care -- chemotherapy education to be scheduled  -- port placement to be scheduled.  --  zofran 36m q8H PRN and compazine 166mPO q6H for nausea -- EMLA cream for port -- no pain medication required at this time.   Orders Placed This Encounter  Procedures   IR IMAGING GUIDED PORT INSERTION    Standing Status:   Future    Standing Expiration Date:   02/12/2022    Order Specific Question:   Reason for Exam (SYMPTOM  OR DIAGNOSIS REQUIRED)    Answer:   lung cancer, port required for chemotherapy    Order Specific Question:   [facility]    [facility]  All questions were answered. The patient knows to call the clinic with any problems, questions or concerns.  A total of more than 30 minutes were spent on this encounter with face-to-face time and non-face-to-face time, including preparing to see the patient, ordering tests and/or medications, counseling the patient and coordination of care as outlined above.   JoLedell PeoplesMD Department of Hematology/Oncology CoRac[facility] WeK[facility] Correctional Psychiatric Centerhone: [phone]: [phone]: [email or web address]  02/12/2021 12:00 PM

## 2021-02-13 ENCOUNTER — Other Ambulatory Visit: Payer: Self-pay

## 2021-02-13 ENCOUNTER — Ambulatory Visit
Admission: RE | Admit: 2021-02-13 | Discharge: 2021-02-13 | Disposition: A | Discharge status: Home / Self Care | Payer: 59 | Source: Ambulatory Visit | Attending: Radiation Oncology | Admitting: Radiation Oncology

## 2021-02-13 ENCOUNTER — Encounter (HOSPITAL_COMMUNITY): Payer: Self-pay

## 2021-02-13 ENCOUNTER — Encounter (HOSPITAL_COMMUNITY): Payer: Self-pay | Admitting: Hematology and Oncology

## 2021-02-13 DIAGNOSIS — Z510 Encounter for antineoplastic radiation therapy: Secondary | ICD-10-CM | POA: Diagnosis not present

## 2021-02-13 LAB — GUARDANT 360

## 2021-02-14 ENCOUNTER — Ambulatory Visit: Payer: 59 | Admitting: Radiation Oncology

## 2021-02-14 ENCOUNTER — Ambulatory Visit
Admission: RE | Admit: 2021-02-14 | Discharge: 2021-02-14 | Disposition: A | Discharge status: Home / Self Care | Payer: 59 | Source: Ambulatory Visit | Attending: Radiation Oncology | Admitting: Radiation Oncology

## 2021-02-14 DIAGNOSIS — Z510 Encounter for antineoplastic radiation therapy: Secondary | ICD-10-CM | POA: Diagnosis not present

## 2021-02-15 ENCOUNTER — Ambulatory Visit
Admission: RE | Admit: 2021-02-15 | Discharge: 2021-02-15 | Disposition: A | Discharge status: Home / Self Care | Payer: 59 | Source: Ambulatory Visit | Attending: Radiation Oncology | Admitting: Radiation Oncology

## 2021-02-15 DIAGNOSIS — Z510 Encounter for antineoplastic radiation therapy: Secondary | ICD-10-CM | POA: Diagnosis not present

## 2021-02-16 ENCOUNTER — Ambulatory Visit: Payer: 59 | Admitting: Radiation Oncology

## 2021-02-16 ENCOUNTER — Telehealth: Payer: Self-pay | Admitting: Hematology and Oncology

## 2021-02-16 ENCOUNTER — Ambulatory Visit
Admission: RE | Admit: 2021-02-16 | Discharge: 2021-02-16 | Disposition: A | Discharge status: Home / Self Care | Payer: 59 | Source: Ambulatory Visit | Attending: Radiation Oncology | Admitting: Radiation Oncology

## 2021-02-16 DIAGNOSIS — Z510 Encounter for antineoplastic radiation therapy: Secondary | ICD-10-CM | POA: Diagnosis not present

## 2021-02-16 NOTE — Telephone Encounter (Signed)
Scheduled per los. Called and left msg. Mailed printout  °

## 2021-02-19 ENCOUNTER — Ambulatory Visit: Payer: 59 | Admitting: Radiation Oncology

## 2021-02-19 ENCOUNTER — Ambulatory Visit
Admission: RE | Admit: 2021-02-19 | Discharge: 2021-02-19 | Disposition: A | Discharge status: Home / Self Care | Payer: 59 | Source: Ambulatory Visit | Attending: Radiation Oncology | Admitting: Radiation Oncology

## 2021-02-19 ENCOUNTER — Other Ambulatory Visit: Payer: Self-pay | Admitting: Radiation Oncology

## 2021-02-19 DIAGNOSIS — C7931 Secondary malignant neoplasm of brain: Secondary | ICD-10-CM

## 2021-02-19 DIAGNOSIS — Z510 Encounter for antineoplastic radiation therapy: Secondary | ICD-10-CM | POA: Diagnosis not present

## 2021-02-19 MED ORDER — DEXAMETHASONE 2 MG PO TABS: ORAL_TABLET | ORAL | 2 refills | Status: DC

## 2021-02-20 ENCOUNTER — Other Ambulatory Visit: Payer: Self-pay

## 2021-02-20 ENCOUNTER — Ambulatory Visit
Admission: RE | Admit: 2021-02-20 | Discharge: 2021-02-20 | Disposition: A | Discharge status: Home / Self Care | Payer: 59 | Source: Ambulatory Visit | Attending: Radiation Oncology | Admitting: Radiation Oncology

## 2021-02-20 DIAGNOSIS — Z510 Encounter for antineoplastic radiation therapy: Secondary | ICD-10-CM | POA: Diagnosis not present

## 2021-02-21 ENCOUNTER — Ambulatory Visit: Payer: 59 | Admitting: Radiation Oncology

## 2021-02-21 ENCOUNTER — Ambulatory Visit
Admission: RE | Admit: 2021-02-21 | Discharge: 2021-02-21 | Disposition: A | Discharge status: Home / Self Care | Payer: 59 | Source: Ambulatory Visit | Attending: Radiation Oncology | Admitting: Radiation Oncology

## 2021-02-21 DIAGNOSIS — Z510 Encounter for antineoplastic radiation therapy: Secondary | ICD-10-CM | POA: Diagnosis not present

## 2021-02-22 ENCOUNTER — Other Ambulatory Visit: Payer: Self-pay

## 2021-02-22 ENCOUNTER — Ambulatory Visit
Admission: RE | Admit: 2021-02-22 | Discharge: 2021-02-22 | Disposition: A | Discharge status: Home / Self Care | Payer: 59 | Source: Ambulatory Visit | Attending: Radiation Oncology | Admitting: Radiation Oncology

## 2021-02-22 DIAGNOSIS — Z510 Encounter for antineoplastic radiation therapy: Secondary | ICD-10-CM | POA: Diagnosis not present

## 2021-02-23 ENCOUNTER — Encounter: Payer: Self-pay | Admitting: General Practice

## 2021-02-23 ENCOUNTER — Encounter: Payer: Self-pay | Admitting: Radiation Oncology

## 2021-02-23 ENCOUNTER — Ambulatory Visit
Admission: RE | Admit: 2021-02-23 | Discharge: 2021-02-23 | Disposition: A | Discharge status: Home / Self Care | Payer: 59 | Source: Ambulatory Visit | Attending: Radiation Oncology | Admitting: Radiation Oncology

## 2021-02-23 DIAGNOSIS — Z510 Encounter for antineoplastic radiation therapy: Secondary | ICD-10-CM | POA: Diagnosis not present

## 2021-02-23 NOTE — Progress Notes (Signed)
[place] provided proof of filing for Social Security disability on patient behalf, filed as TERI claim.  Edwyna Shell, LCSW Clinical Social Worker Phone:  [phone]

## 2021-02-26 ENCOUNTER — Inpatient Hospital Stay: Payer: 59

## 2021-02-26 ENCOUNTER — Encounter: Payer: Self-pay | Admitting: Hematology and Oncology

## 2021-02-26 ENCOUNTER — Other Ambulatory Visit: Payer: Self-pay

## 2021-02-26 ENCOUNTER — Other Ambulatory Visit: Payer: Self-pay | Admitting: Internal Medicine

## 2021-02-26 ENCOUNTER — Other Ambulatory Visit: Payer: Self-pay | Admitting: Radiology

## 2021-02-26 NOTE — Progress Notes (Signed)
Met with patient at registration to introduce myself as Arboriculturist and to offer available resources.  Discussed one-time $1000 Radio broadcast assistant to assist with personal expenses while going through treatment. Also, possible available copay assistance.  Gave her my card if interested in applying and for any additional financial questions or concerns.

## 2021-02-27 ENCOUNTER — Encounter (HOSPITAL_COMMUNITY): Payer: Self-pay

## 2021-02-27 ENCOUNTER — Ambulatory Visit (HOSPITAL_COMMUNITY)
Admission: RE | Admit: 2021-02-27 | Discharge: 2021-02-27 | Disposition: A | Discharge status: Home / Self Care | Payer: 59 | Source: Ambulatory Visit | Attending: Hematology and Oncology | Admitting: Hematology and Oncology

## 2021-02-27 ENCOUNTER — Other Ambulatory Visit: Payer: Self-pay

## 2021-02-27 DIAGNOSIS — Z79899 Other long term (current) drug therapy: Secondary | ICD-10-CM | POA: Insufficient documentation

## 2021-02-27 DIAGNOSIS — C3492 Malignant neoplasm of unspecified part of left bronchus or lung: Secondary | ICD-10-CM | POA: Diagnosis present

## 2021-02-27 DIAGNOSIS — C3432 Malignant neoplasm of lower lobe, left bronchus or lung: Secondary | ICD-10-CM

## 2021-02-27 DIAGNOSIS — Z87891 Personal history of nicotine dependence: Secondary | ICD-10-CM | POA: Insufficient documentation

## 2021-02-27 HISTORY — PX: IR IMAGING GUIDED PORT INSERTION: IMG5740

## 2021-02-27 MED ORDER — LIDOCAINE-EPINEPHRINE 1 %-1:100000 IJ SOLN
INTRAMUSCULAR | Status: AC
  Filled 2021-02-27: qty 1

## 2021-02-27 MED ORDER — FENTANYL CITRATE (PF) 100 MCG/2ML IJ SOLN
INTRAMUSCULAR | Status: AC
  Filled 2021-02-27: qty 2

## 2021-02-27 MED ORDER — LIDOCAINE-EPINEPHRINE 1 %-1:100000 IJ SOLN
INTRAMUSCULAR | Status: AC | PRN
  Administered 2021-02-27: 10 mL via INTRADERMAL

## 2021-02-27 MED ORDER — MIDAZOLAM HCL 2 MG/2ML IJ SOLN
INTRAMUSCULAR | Status: AC
  Filled 2021-02-27: qty 4

## 2021-02-27 MED ORDER — LIDOCAINE HCL 1 % IJ SOLN
INTRAMUSCULAR | Status: AC
  Filled 2021-02-27: qty 20

## 2021-02-27 MED ORDER — LIDOCAINE HCL (PF) 1 % IJ SOLN
INTRAMUSCULAR | Status: AC | PRN
  Administered 2021-02-27: 10 mL via INTRADERMAL

## 2021-02-27 MED ORDER — HEPARIN SOD (PORK) LOCK FLUSH 100 UNIT/ML IV SOLN
INTRAVENOUS | Status: AC | PRN
  Administered 2021-02-27: 500 [IU] via INTRAVENOUS

## 2021-02-27 MED ORDER — MIDAZOLAM HCL 2 MG/2ML IJ SOLN
INTRAMUSCULAR | Status: AC | PRN
  Administered 2021-02-27: 1 mg via INTRAVENOUS

## 2021-02-27 MED ORDER — SODIUM CHLORIDE 0.9 % IV SOLN: INTRAVENOUS | Status: DC

## 2021-02-27 MED ORDER — HEPARIN SOD (PORK) LOCK FLUSH 100 UNIT/ML IV SOLN
INTRAVENOUS | Status: AC
  Filled 2021-02-27: qty 5

## 2021-02-27 MED ORDER — FENTANYL CITRATE (PF) 100 MCG/2ML IJ SOLN
INTRAMUSCULAR | Status: AC | PRN
  Administered 2021-02-27: 50 ug via INTRAVENOUS

## 2021-02-27 NOTE — Sedation Documentation (Signed)
Patient is resting comfortably with eyes closed.

## 2021-02-27 NOTE — Consult Note (Signed)
Chief Complaint: Patient was seen in consultation today for Port-A-Cath placement  Referring Physician(s): Dorsey,John T IV  Supervising Physician: Markus Daft  Patient Status: [name] - Out-pt  History of Present Illness: Loretta Woods is a 65 y.o. 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  Past Medical History:  Diagnosis Date   Hypertension    Thyroid disease     Past Surgical History:  Procedure Laterality Date   CRYOTHERAPY  01/19/2021   Procedure: CRYOTHERAPY;  Surgeon: Garner Nash, DO;  Location: [place] [facility];  Service: Pulmonary;;   HEMOSTASIS CONTROL  01/19/2021   Procedure: HEMOSTASIS CONTROL;  Surgeon: Garner Nash, DO;  Location: [place] [facility];  Service: Pulmonary;;   VIDEO BRONCHOSCOPY  01/19/2021   Procedure: VIDEO BRONCHOSCOPY WITHOUT FLUORO;  Surgeon: Garner Nash, DO;  Location: [place] [facility];  Service: Pulmonary;;    Allergies: Patient has no known allergies.  Medications: Prior to Admission medications   Medication Sig Start Date End Date Taking? Authorizing Provider  acetaminophen (TYLENOL) 500 MG tablet Take 500 mg by mouth every 6 (six) hours as needed for moderate pain.    [provider]  atorvastatin (LIPITOR) 10 MG tablet Take 10 mg by mouth daily. 10/28/20   [provider]  cycloSPORINE (RESTASIS) 0.05 % ophthalmic emulsion Place 1 drop into both eyes 2 (two) times daily.    [provider]  dexamethasone (DECADRON) 2 MG tablet Take 2 tablets TID. On 8/15 taper to 2 tabs BID. On 9/15 taper to 2 tabs daily. On 9/30 taper to 1 tab daily.  On 10/7 taper to one tab QOD. Last dose: 10/15. Take with food. 02/19/21   Eppie Gibson, MD  EUTHYROX 75 MCG tablet Take 75 mcg by mouth daily. 10/28/20   [provider]  fluconazole (DIFLUCAN) 100 MG tablet Hold  Atorvastatin while on this medication. Take 2 tablets today, then 1 tablet daily x 20 more days. 02/07/21   Eppie Gibson, MD  folic acid (FOLVITE) 1 MG tablet Take 1 tablet (1 mg total) by mouth daily. 02/12/21   Orson Slick, MD  hydrochlorothiazide (HYDRODIURIL) 50 MG tablet Take 25 mg by mouth daily. 11/30/20   [provider]  levETIRAcetam (KEPPRA) 500 MG tablet Take 1 tablet (500 mg total) by mouth 2 (two) times daily. 01/19/21   Patrecia Pour, MD  lidocaine-prilocaine (EMLA) cream Apply 1 application topically as needed. 02/12/21   Orson Slick, MD  Multiple Vitamins-Minerals (EMERGEN-C IMMUNE) PACK Take 1 packet by mouth daily as needed (immune support).    [provider]  ondansetron (ZOFRAN) 8 MG tablet Take 1 tablet (8 mg total) by mouth every 8 (eight) hours as needed. 02/12/21   Orson Slick, MD  prochlorperazine (COMPAZINE) 10 MG tablet Take 1 tablet (10 mg total) by mouth every 6 (six) hours as needed for nausea or vomiting. 02/12/21   Orson Slick, MD  temazepam (RESTORIL) 22.5 MG capsule Take 1 capsule (22.5 mg total) by mouth at bedtime as needed for sleep. 02/02/21   Eppie Gibson, MD     Family History  Problem Relation Age of Onset   Bladder Cancer Father     Social History   Socioeconomic History   Marital status: Divorced    Spouse name: Not on file   Number of children:  0   Years of education: Not on file   Highest education level: Not on file  Occupational History   Not on file  Tobacco Use   Smoking status: Former    Packs/day: 1.00    Types: Cigarettes    Quit date: 2012    Years since quitting: 10.6   Smokeless tobacco: Never  Vaping Use   Vaping Use: Every day  Substance and Sexual Activity   Alcohol use: Never   Drug use: Never   Sexual activity: Not on file  Other Topics Concern   Not on file  Social History Narrative   Not on file   Social Determinants of Health   Financial Resource Strain: Low Risk     Difficulty of Paying Living Expenses: Not hard at all  Food Insecurity: No Food Insecurity   Worried About Charity fundraiser in the Last Year: Never true   [place] in the Last Year: Never true  Transportation Needs: No Transportation Needs   Lack of Transportation (Medical): No   Lack of Transportation (Non-Medical): No  Physical Activity: Not on file  Stress: Not on file  Social Connections: Not on file      Review of Systems currently denies fever, headache, chest pain, dyspnea, worsening cough, abdominal/flank pain, nausea, vomiting or bleeding  Vital Signs: Vitals:   02/27/21 1257  BP: (!) 184/107  Pulse: (!) 101  Resp: 18  Temp: 98.5 F (36.9 C)  SpO2: 100%      Physical Exam awake, alert.  Chest with distant breath sounds bilaterally.  Heart with slightly tachycardic rate, regular rhythm, abdomen soft, positive bowel sounds, nontender.  Bilateral pretibial edema noted.  Patient does have mild left eyelid droop  Imaging: MR Brain W Wo Contrast  Addendum Date: 02/06/2021   ADDENDUM REPORT: 02/06/2021 19:18 ADDENDUM: These results will be called to the ordering clinician or representative by the Radiologist Assistant, and communication documented in the PACS or [employer]. Electronically Signed   By: Margaretha Sheffield MD   On: 02/06/2021 19:18   Addendum Date: 02/06/2021   ADDENDUM REPORT: 02/06/2021 18:46 ADDENDUM: In addition to the areas described in impression #1, there also are multiple areas of restricted diffusion in the superior left cerebellum that likely represent acute/subacute infarcts. Electronically Signed   By: Margaretha Sheffield MD   On: 02/06/2021 18:46   Result Date: 02/06/2021 CLINICAL DATA:  Brain/CNS neoplasm.  Treatment planning. EXAM: MRI HEAD WITHOUT AND WITH CONTRAST TECHNIQUE: Multiplanar, multiecho pulse sequences of the brain and surrounding structures were obtained without and with intravenous contrast. CONTRAST:  60mL MULTIHANCE  GADOBENATE DIMEGLUMINE 529 MG/ML IV SOLN COMPARISON:  MRI 01/17/2021. FINDINGS: Brain: New areas of restricted diffusion in the left cerebellum, left pons, bilateral thalami, left frontal and bilateral parieto-occipital cortex. No hydrocephalus, acute hemorrhage, extra-axial fluid collection. Numerous enhancing metastatic lesions, annotated and described on series 14. Possible new lesions: *An ill-defined area of enhancement in the left cerebellum as restricted diffusion and may represent infarct or metastatic disease. *New ill-defined enhancement along the left tentorial leaflet (64), potentially a dural metastasis. *Multiple punctate lesions in the more inferior occipital lobe and in the right parieto-occipital region (images 80, 75) which may be new. *Punctate lesion in the anterior left frontal cortex (95) is more conspicuous on this study, but maybe present on the prior. *Punctate lesion in the lateral left temporal lobe, likely new. *The enhancing lesion in the left occipital of (59), likely new. *Punctate lesion  and the anterior left temporal lobe (86), likely new. *Punctate lesion in the inferior right cerebellum (29), likely new. Similar or increased lesions: *Approximately 20 mm lesion in the anterolateral right temporal of (78), not substantially changed *Approximately 46 mm lesion in the more posterior right temporal lobe (68) with central necrosis and extension inferiorly along the petrous temporal bone, suggesting possible dural extension. *5 mm lesion in the high left parasagittal frontal lobe is increased in size (119). *Wispy lesion in the more medial anterior left frontal lobe is slightly increased in bulk (97). *Punctate lesion in the right parieto-occipital region (92) is similar versus slightly more conspicuous. *4 mm lesion in the posterior and lateral right temporal lobe (86), likely slightly increased. *Punctate lesion in the more lateral left cerebellum (42) appears similar. *Punctate lesion  in the anterior right frontal lobe (88), likely similar. *5 mm lesion in the left temporal occipital region (61) appears similar versus slightly increased. *Approximately 6 mm lesions in bilateral occipital lobes (74) similar versus slightly increased. Edema and mass effect associated with the dominant lesions in the right temporal lobe is improved with decreased leftward midline shift, now 3 mm at the foramen of [place]. Vascular: Major arterial flow voids are maintained at the skull base. Skull and upper cervical spine: Normal marrow signal. Degenerative changes of the upper cervical spine. Sinuses/Orbits: Clear sinuses.  Unremarkable orbits. Other: Small left mastoid effusion. IMPRESSION: 1. New areas of restricted diffusion in the left pons, bilateral thalami, left frontal and bilateral parieto-occipital cortex, concerning for acute or early subacute infarcts. Some of the areas (including an additional ill-defined area of enhancement and restricted diffusion in the left cerebellum) may represent enhancing infarct or metastatic disease. These findings warrant attention on short interval follow-up to differentiate evolving infarct from metastasis. 2. Redemonstrated numerous infratentorial and supratentorial metastases, including suspected new metastases and likely slightly increased size/conspicuity of many of the metastases, as detailed above. The increased conspicuity of some of the lesions may be in part due to improved technique on this study. The largest lesion in the right temporal lobe and extends along the petrous temporal bone inferiorly. 3. Edema and mass effect associated with the dominant lesions in the right temporal lobe is improved with decreased leftward midline shift, now 3 mm at the foramen of [place]. Electronically Signed: By: Margaretha Sheffield MD On: 02/06/2021 18:27    Labs:  CBC: Recent Labs    01/17/21 1803 01/29/21 1021 02/12/21 0959  WBC 9.7 43.2* 36.5*  HGB 12.3 15.9* 14.1  HCT  36.6 45.1 41.0  PLT 344 437* 303    COAGS: Recent Labs    01/17/21 1803  INR 1.0    BMP: Recent Labs    01/17/21 1803 01/18/21 0504 01/29/21 1021 02/12/21 0959  NA 129* 134* 131* 130*  K 3.2* 4.4 3.8 3.4*  CL 93* 95* 92* 93*  CO2 25 26 29 28   GLUCOSE 103* 133* 108* 126*  BUN 14 13 27* 22  CALCIUM 9.1 10.2 9.4 8.9  CREATININE 0.56 0.62 0.71 0.67  GFRNONAA >60 >60 >60 >60    LIVER FUNCTION TESTS: Recent Labs    01/17/21 1803 01/29/21 1021 02/12/21 0959  BILITOT 0.7 0.6 0.6  AST 20 15 12*  ALT 17 34 40  ALKPHOS 83 95 78  PROT 7.3 6.8 5.8*  ALBUMIN 4.2 3.7 3.3*    TUMOR MARKERS: No results for input(s): AFPTM, CEA, CA199, CHROMGRNA in the last 8760 hours.  Assessment and Plan: 65 y.o. 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  All of the patient's questions were answered, patient is agreeable to proceed. Consent signed and in chart.    Thank you for this interesting consult.  I greatly enjoyed meeting Loretta Woods and look forward to participating in their care.  A copy of this report was sent to the requesting provider on this date.  Electronically Signed: D. Rowe Robert, PA-C 02/27/2021, 12:51 PM   I spent a total of  25 minutes   in face to face in clinical consultation, greater than 50% of which was counseling/coordinating care for Port-A-Cath placement

## 2021-02-27 NOTE — Discharge Instructions (Addendum)
Please call Interventional Radiology clinic [phone] with any questions or concerns.  You may remove your dressing and shower tomorrow. Do not submerge in a tub or pool until site is healing well.  DO NOT use EMLA cream on your port site for 2 weeks as this cream will remove surgical glue on your incision. Allow the glue to fall off on on its own.   When your port is not being accessed as frequently, it will always need to be flushed on a monthly basis.      Implanted Port Insertion, Care After This sheet gives you information about how to care for yourself after your procedure. Your health care provider may also give you more specific instructions. If you have problems or questions, contact your health care provider. What can I expect after the procedure? After the procedure, it is common to have: Discomfort at the port insertion site. Bruising on the skin over the port. This should improve over 3-4 days. Follow these instructions at home: [facility] care After your port is placed, you will get a manufacturer's information card. The card has information about your port. Keep this card with you at all times. Take care of the port as told by your health care provider. Ask your health care provider if you or a family member can get training for taking care of the port at home. A home health care nurse may also take care of the port. Make sure to remember what type of port you have. Incision care Follow instructions from your health care provider about how to take care of your port insertion site. Make sure you: Wash your hands with soap and water before and after you change your bandage (dressing). If soap and water are not available, use hand sanitizer. Change your dressing as told by your health care provider. Leave stitches (sutures), skin glue, or adhesive strips in place. These skin closures may need to stay in place for 2 weeks or longer. If adhesive strip edges start to loosen and curl  up, you may trim the loose edges. Do not remove adhesive strips completely unless your health care provider tells you to do that. Check your port insertion site every day for signs of infection. Check for: Redness, swelling, or pain. Fluid or blood. Warmth. Pus or a bad smell.        Activity Return to your normal activities as told by your health care provider. Ask your health care provider what activities are safe for you. Do not lift anything that is heavier than 10 lb (4.5 kg), or the limit that you are told, until your health care provider says that it is safe. General instructions Take over-the-counter and prescription medicines only as told by your health care provider. Do not take baths, swim, or use a hot tub until your health care provider approves. Ask your health care provider if you may take showers. You may only be allowed to take sponge baths. Do not drive for 24 hours if you were given a sedative during your procedure. Wear a medical alert bracelet in case of an emergency. This will tell any health care providers that you have a port. Keep all follow-up visits as told by your health care provider. This is important. Contact a health care provider if: You cannot flush your port with saline as directed, or you cannot draw blood from the port. You have a fever or chills. You have redness, swelling, or pain around your port insertion site. You  have fluid or blood coming from your port insertion site. Your port insertion site feels warm to the touch. You have pus or a bad smell coming from the port insertion site. Get help right away if: You have chest pain or shortness of breath. You have bleeding from your port that you cannot control. Summary Take care of the port as told by your health care provider. Keep the manufacturer's information card with you at all times. Change your dressing as told by your health care provider. Contact a health care provider if you have a  fever or chills or if you have redness, swelling, or pain around your port insertion site. Keep all follow-up visits as told by your health care provider. This information is not intended to replace advice given to you by your health care provider. Make sure you discuss any questions you have with your health care provider. Document Revised: 01/27/2018 Document Reviewed: 01/27/2018 Elsevier Patient Education  2021 [place].   Moderate Conscious Sedation, Adult, Care After This sheet gives you information about how to care for yourself after your procedure. Your health care provider may also give you more specific instructions. If you have problems or questions, contact your health care provider. What can I expect after the procedure? After the procedure, it is common to have: Sleepiness for several hours. Impaired judgment for several hours. Difficulty with balance. Vomiting if you eat too soon. Follow these instructions at home: For the time period you were told by your health care provider: Rest. Do not participate in activities where you could fall or become injured. Do not drive or use machinery. Do not drink alcohol. Do not take sleeping pills or medicines that cause drowsiness. Do not make important decisions or sign legal documents. Do not take care of children on your own.        Eating and drinking Follow the diet recommended by your health care provider. Drink enough fluid to keep your urine pale yellow. If you vomit: Drink water, juice, or soup when you can drink without vomiting. Make sure you have little or no nausea before eating solid foods.    General instructions Take over-the-counter and prescription medicines only as told by your health care provider. Have a responsible adult stay with you for the time you are told. It is important to have someone help care for you until you are awake and alert. Do not smoke. Keep all follow-up visits as told by your  health care provider. This is important. Contact a health care provider if: You are still sleepy or having trouble with balance after 24 hours. You feel light-headed. You keep feeling nauseous or you keep vomiting. You develop a rash. You have a fever. You have redness or swelling around the IV site. Get help right away if: You have trouble breathing. You have new-onset confusion at home. Summary After the procedure, it is common to feel sleepy, have impaired judgment, or feel nauseous if you eat too soon. Rest after you get home. Know the things you should not do after the procedure. Follow the diet recommended by your health care provider and drink enough fluid to keep your urine pale yellow. Get help right away if you have trouble breathing or new-onset confusion at home. This information is not intended to replace advice given to you by your health care provider. Make sure you discuss any questions you have with your health care provider. Document Revised: 10/29/2019 Document Reviewed: 05/27/2019 Elsevier Patient Education  [place].

## 2021-02-27 NOTE — Procedures (Signed)
Interventional Radiology Procedure:   Indications: Lung cancer  Procedure: Port placement  Findings: Right jugular port, tip at SVC/RA junction  Complications: None     EBL: Minimal, less than 10 ml  Plan: Discharge in one hour.  Keep port site and incisions dry for at least 24 hours.     Kilan Banfill R. Anselm Pancoast, MD  Pager: [phone]

## 2021-03-02 ENCOUNTER — Telehealth: Payer: Self-pay | Admitting: *Deleted

## 2021-03-02 NOTE — Telephone Encounter (Signed)
Received call from pt's niece. She is asking about the care of pt's prt. Pt had it placed on 02/27/21. Pt is having her 1st chemo on 03/05/21. Advised that she remove the dressing, she needs to leave the shiny dermabond glue in place as it will come off on its on over time. She can take a shower and let water gently flow over incision sites but do not scrub the area.  She can start using the EMLA cream in about 2 weeks-after the incisions heal. She will put it on her port site and cover with plastic wrap before she leaves the house on the days she gets chemo or labs. Advised that on Monday, we will use ice to help numb the area prior to inserting the needle.  She can use ice at home as well if the port site is uncomfortable.  Suzette Battiest voiced understanding and will call her aunt with the above information.

## 2021-03-05 ENCOUNTER — Inpatient Hospital Stay: Payer: 59

## 2021-03-05 ENCOUNTER — Other Ambulatory Visit: Payer: Self-pay | Admitting: *Deleted

## 2021-03-05 ENCOUNTER — Other Ambulatory Visit: Payer: Self-pay | Admitting: Hematology and Oncology

## 2021-03-05 ENCOUNTER — Inpatient Hospital Stay (HOSPITAL_BASED_OUTPATIENT_CLINIC_OR_DEPARTMENT_OTHER): Payer: 59 | Admitting: Hematology and Oncology

## 2021-03-05 ENCOUNTER — Other Ambulatory Visit: Payer: Self-pay

## 2021-03-05 VITALS — BP 151/93 | HR 100 | Temp 97.6°F | Resp 18 | Ht 62.0 in | Wt 87.5 lb

## 2021-03-05 DIAGNOSIS — C3432 Malignant neoplasm of lower lobe, left bronchus or lung: Secondary | ICD-10-CM | POA: Diagnosis not present

## 2021-03-05 DIAGNOSIS — C3492 Malignant neoplasm of unspecified part of left bronchus or lung: Secondary | ICD-10-CM | POA: Diagnosis not present

## 2021-03-05 DIAGNOSIS — G9389 Other specified disorders of brain: Secondary | ICD-10-CM | POA: Diagnosis not present

## 2021-03-05 DIAGNOSIS — E876 Hypokalemia: Secondary | ICD-10-CM

## 2021-03-05 DIAGNOSIS — Z510 Encounter for antineoplastic radiation therapy: Secondary | ICD-10-CM | POA: Diagnosis not present

## 2021-03-05 DIAGNOSIS — Z95828 Presence of other vascular implants and grafts: Secondary | ICD-10-CM

## 2021-03-05 LAB — CMP (CANCER CENTER ONLY)
ALT: 29 U/L (ref 0–44)
AST: 8 U/L — ABNORMAL LOW (ref 15–41)
Albumin: 2.6 g/dL — ABNORMAL LOW (ref 3.5–5.0)
Alkaline Phosphatase: 72 U/L (ref 38–126)
Anion gap: 11 (ref 5–15)
BUN: 29 mg/dL — ABNORMAL HIGH (ref 8–23)
CO2: 29 mmol/L (ref 22–32)
Calcium: 8.7 mg/dL — ABNORMAL LOW (ref 8.9–10.3)
Chloride: 95 mmol/L — ABNORMAL LOW (ref 98–111)
Creatinine: 0.65 mg/dL (ref 0.44–1.00)
GFR, Estimated: >60 mL/min (ref >60)
Glucose, Bld: 220 mg/dL — ABNORMAL HIGH (ref 70–99)
Potassium: 2.8 mmol/L — ABNORMAL LOW (ref 3.5–5.1)
Sodium: 135 mmol/L (ref 135–145)
Total Bilirubin: 0.6 mg/dL (ref 0.3–1.2)
Total Protein: 5.6 g/dL — ABNORMAL LOW (ref 6.5–8.1)

## 2021-03-05 LAB — CBC WITH DIFFERENTIAL (CANCER CENTER ONLY)
Abs Immature Granulocytes: 0.62 10*3/uL — ABNORMAL HIGH (ref 0.00–0.07)
Basophils Absolute: 0 10*3/uL (ref 0.0–0.1)
Basophils Relative: 0 %
Eosinophils Absolute: 0 10*3/uL (ref 0.0–0.5)
Eosinophils Relative: 0 %
HCT: 34.7 % — ABNORMAL LOW (ref 36.0–46.0)
Hemoglobin: 12.4 g/dL (ref 12.0–15.0)
Immature Granulocytes: 7 %
Lymphocytes Relative: 6 %
Lymphs Abs: 0.6 10*3/uL — ABNORMAL LOW (ref 0.7–4.0)
MCH: 30.3 pg (ref 26.0–34.0)
MCHC: 35.7 g/dL (ref 30.0–36.0)
MCV: 84.8 fL (ref 80.0–100.0)
Monocytes Absolute: 0.4 10*3/uL (ref 0.1–1.0)
Monocytes Relative: 5 %
Neutro Abs: 7.2 10*3/uL (ref 1.7–7.7)
Neutrophils Relative %: 82 %
Platelet Count: 119 10*3/uL — ABNORMAL LOW (ref 150–400)
RBC: 4.09 MIL/uL (ref 3.87–5.11)
RDW: 13.2 % (ref 11.5–15.5)
WBC Count: 8.8 10*3/uL (ref 4.0–10.5)
nRBC: 0 % (ref 0.0–0.2)

## 2021-03-05 LAB — TSH: TSH: 1.622 u[IU]/mL (ref 0.308–3.960)

## 2021-03-05 MED ORDER — POTASSIUM CHLORIDE 10 MEQ/100ML IV SOLN
10.0000 meq | INTRAVENOUS | Status: AC
  Administered 2021-03-05 (×2): 10 meq via INTRAVENOUS
  Filled 2021-03-05 (×2): qty 100

## 2021-03-05 MED ORDER — CYANOCOBALAMIN 1000 MCG/ML IJ SOLN
1000.0000 ug | Freq: Once | INTRAMUSCULAR | Status: AC
Start: 2021-03-05 — End: 2021-03-05
  Administered 2021-03-05: 1000 ug via INTRAMUSCULAR
  Filled 2021-03-05: qty 1

## 2021-03-05 MED ORDER — POTASSIUM CHLORIDE 10 MEQ/100ML IV SOLN: 10.0000 meq | INTRAVENOUS | Status: AC

## 2021-03-05 MED ORDER — SODIUM CHLORIDE 0.9 % IV SOLN
200.0000 mg | Freq: Once | INTRAVENOUS | Status: AC
  Administered 2021-03-05: 200 mg via INTRAVENOUS
  Filled 2021-03-05: qty 8

## 2021-03-05 MED ORDER — SODIUM CHLORIDE 0.9 % IV SOLN
150.0000 mg | Freq: Once | INTRAVENOUS | Status: AC
  Administered 2021-03-05: 150 mg via INTRAVENOUS
  Filled 2021-03-05: qty 150

## 2021-03-05 MED ORDER — POTASSIUM CHLORIDE CRYS ER 20 MEQ PO TBCR: 20.0000 meq | EXTENDED_RELEASE_TABLET | Freq: Every day | ORAL | 3 refills | Status: DC

## 2021-03-05 MED ORDER — SODIUM CHLORIDE 0.9 % IV SOLN: Freq: Once | INTRAVENOUS | Status: AC

## 2021-03-05 MED ORDER — HEPARIN SOD (PORK) LOCK FLUSH 100 UNIT/ML IV SOLN
500.0000 [IU] | Freq: Once | INTRAVENOUS | Status: AC | PRN
  Administered 2021-03-05: 500 [IU]

## 2021-03-05 MED ORDER — CARBOPLATIN CHEMO INJECTION 450 MG/45ML
365.5000 mg | Freq: Once | INTRAVENOUS | Status: AC
  Administered 2021-03-05: 370 mg via INTRAVENOUS
  Filled 2021-03-05: qty 37

## 2021-03-05 MED ORDER — PEMETREXED DISODIUM CHEMO INJECTION 500 MG
500.0000 mg/m2 | Freq: Once | INTRAVENOUS | Status: AC
  Administered 2021-03-05: 700 mg via INTRAVENOUS
  Filled 2021-03-05: qty 20

## 2021-03-05 MED ORDER — SODIUM CHLORIDE 0.9% FLUSH
10.0000 mL | Freq: Once | INTRAVENOUS | Status: AC
  Administered 2021-03-05: 10 mL via INTRAVENOUS

## 2021-03-05 MED ORDER — SODIUM CHLORIDE 0.9% FLUSH
10.0000 mL | INTRAVENOUS | Status: DC | PRN
  Administered 2021-03-05: 10 mL

## 2021-03-05 MED ORDER — SODIUM CHLORIDE 0.9 % IV SOLN
10.0000 mg | Freq: Once | INTRAVENOUS | Status: AC
  Administered 2021-03-05: 10 mg via INTRAVENOUS
  Filled 2021-03-05: qty 10

## 2021-03-05 MED ORDER — CITALOPRAM HYDROBROMIDE 20 MG PO TABS: 20.0000 mg | ORAL_TABLET | Freq: Every day | ORAL | 5 refills | Status: AC

## 2021-03-05 MED ORDER — PALONOSETRON HCL INJECTION 0.25 MG/5ML
0.2500 mg | Freq: Once | INTRAVENOUS | Status: AC
  Administered 2021-03-05: 0.25 mg via INTRAVENOUS
  Filled 2021-03-05: qty 5

## 2021-03-05 NOTE — Patient Instructions (Addendum)
[facility]  Discharge Instructions: Thank you for choosing [facility] to provide your oncology and hematology care.   If you have a lab appointment with the [facility], please go directly to the [facility] and check in at the registration area.   Wear comfortable clothing and clothing appropriate for easy access to any Portacath or PICC line.   We strive to give you quality time with your provider. You may need to reschedule your appointment if you arrive late (15 or more minutes).  Arriving late affects you and other patients whose appointments are after yours.  Also, if you miss three or more appointments without notifying the office, you may be dismissed from the clinic at the provider's discretion.      For prescription refill requests, have your pharmacy contact our office and allow 72 hours for refills to be completed.    Today you received the following chemotherapy and/or immunotherapy agents: Keytruda, Alimta, Carboplatin   To help prevent nausea and vomiting after your treatment, we encourage you to take your nausea medication as directed.  BELOW ARE SYMPTOMS THAT SHOULD BE REPORTED IMMEDIATELY: *FEVER GREATER THAN 100.4 F (38 C) OR HIGHER *CHILLS OR SWEATING *NAUSEA AND VOMITING THAT IS NOT CONTROLLED WITH YOUR NAUSEA MEDICATION *UNUSUAL SHORTNESS OF BREATH *UNUSUAL BRUISING OR BLEEDING *URINARY PROBLEMS (pain or burning when urinating, or frequent urination) *BOWEL PROBLEMS (unusual diarrhea, constipation, pain near the anus) TENDERNESS IN MOUTH AND THROAT WITH OR WITHOUT PRESENCE OF ULCERS (sore throat, sores in mouth, or a toothache) UNUSUAL RASH, SWELLING OR PAIN  UNUSUAL VAGINAL DISCHARGE OR ITCHING   Items with * indicate a potential emergency and should be followed up as soon as possible or go to the Emergency Department if any problems should occur.  Please show the CHEMOTHERAPY ALERT CARD or IMMUNOTHERAPY ALERT  CARD at check-in to the Emergency Department and triage nurse.  Should you have questions after your visit or need to cancel or reschedule your appointment, please contact [facility]  Dept: [phone]  and follow the prompts.  Office hours are 8:00 a.m. to 4:30 p.m. Monday - Friday. Please note that voicemails left after 4:00 p.m. may not be returned until the following business day.  We are closed weekends and major holidays. You have access to a nurse at all times for urgent questions. Please call the main number to the clinic Dept: [phone] and follow the prompts.   For any non-urgent questions, you may also contact your provider using MyChart. We now offer e-Visits for anyone 19 and older to request care online for non-urgent symptoms. For details visit [email or web address].   Also download the MyChart app! Go to the app store, search "MyChart", open the app, select [facility], and log in with your MyChart username and password.  Due to Covid, a mask is required upon entering the hospital/clinic. If you do not have a mask, one will be given to you upon arrival. For doctor visits, patients may have 1 support person aged 62 or older with them. For treatment visits, patients cannot have anyone with them due to current Covid guidelines and our immunocompromised population.   Pembrolizumab injection What is this medication? PEMBROLIZUMAB (pem broe liz ue mab) is a monoclonal antibody. It is used totreat certain types of cancer. This medicine may be used for other purposes; ask your health care provider orpharmacist if you have questions. COMMON BRAND NAME(S): Keytruda What should I tell  my care team before I take this medication? They need to know if you have any of these conditions: autoimmune diseases like Crohn's disease, ulcerative colitis, or lupus have had or planning to have an allogeneic stem cell transplant (uses someone else's stem cells) history  of organ transplant history of chest radiation nervous system problems like myasthenia gravis or Guillain-Barre syndrome an unusual or allergic reaction to pembrolizumab, other medicines, foods, dyes, or preservatives pregnant or trying to get pregnant breast-feeding How should I use this medication? This medicine is for infusion into a vein. It is given by a health careprofessional in a hospital or clinic setting. A special MedGuide will be given to you before each treatment. Be sure to readthis information carefully each time. Talk to your pediatrician regarding the use of this medicine in children. While this drug may be prescribed for children as young as 6 months for selectedconditions, precautions do apply. Overdosage: If you think you have taken too much of this medicine contact apoison control center or emergency room at once. NOTE: This medicine is only for you. Do not share this medicine with others. What if I miss a dose? It is important not to miss your dose. Call your doctor or health careprofessional if you are unable to keep an appointment. What may interact with this medication? Interactions have not been studied. This list may not describe all possible interactions. Give your health care provider a list of all the medicines, herbs, non-prescription drugs, or dietary supplements you use. Also tell them if you smoke, drink alcohol, or use illegaldrugs. Some items may interact with your medicine. What should I watch for while using this medication? Your condition will be monitored carefully while you are receiving thismedicine. You may need blood work done while you are taking this medicine. Do not become pregnant while taking this medicine or for 4 months after stopping it. Women should inform their doctor if they wish to become pregnant or think they might be pregnant. There is a potential for serious side effects to an unborn child. Talk to your health care professional or  pharmacist for more information. Do not breast-feed an infant while taking this medicine orfor 4 months after the last dose. What side effects may I notice from receiving this medication? Side effects that you should report to your doctor or health care professionalas soon as possible: allergic reactions like skin rash, itching or hives, swelling of the face, lips, or tongue bloody or black, tarry breathing problems changes in vision chest pain chills confusion constipation cough diarrhea dizziness or feeling faint or lightheaded fast or irregular heartbeat fever flushing joint pain low blood counts - this medicine may decrease the number of white blood cells, red blood cells and platelets. You may be at increased risk for infections and bleeding. muscle pain muscle weakness pain, tingling, numbness in the hands or feet persistent headache redness, blistering, peeling or loosening of the skin, including inside the mouth signs and symptoms of high blood sugar such as dizziness; dry mouth; dry skin; fruity breath; nausea; stomach pain; increased hunger or thirst; increased urination signs and symptoms of kidney injury like trouble passing urine or change in the amount of urine signs and symptoms of liver injury like dark urine, light-colored stools, loss of appetite, nausea, right upper belly pain, yellowing of the eyes or skin sweating swollen lymph nodes weight loss Side effects that usually do not require medical attention (report to yourdoctor or health care professional if they continue or  are bothersome): decreased appetite hair loss tiredness This list may not describe all possible side effects. Call your doctor for medical advice about side effects. You may report side effects to FDA at1-800-FDA-1088. Where should I keep my medication? This drug is given in a hospital or clinic and will not be stored at home. NOTE: This sheet is a summary. It may not cover all possible  information. If you have questions about this medicine, talk to your doctor, pharmacist, orhealth care provider.  2022 Elsevier/Gold Standard (2019-06-02 21:44:53)  Pemetrexed injection What is this medication? PEMETREXED (PEM e TREX ed) is a chemotherapy drug used to treat lung cancers like non-small cell lung cancer and mesothelioma. It may also be used to treatother cancers. This medicine may be used for other purposes; ask your health care provider orpharmacist if you have questions. COMMON BRAND NAME(S): Alimta What should I tell my care team before I take this medication? They need to know if you have any of these conditions: infection (especially a virus infection such as chickenpox, cold sores, or herpes) kidney disease low blood counts, like low white cell, platelet, or red cell counts lung or breathing disease, like asthma radiation therapy an unusual or allergic reaction to pemetrexed, other medicines, foods, dyes, or preservative pregnant or trying to get pregnant breast-feeding How should I use this medication? This drug is given as an infusion into a vein. It is administered in a hospitalor clinic by a specially trained health care professional. Talk to your pediatrician regarding the use of this medicine in children.Special care may be needed. Overdosage: If you think you have taken too much of this medicine contact apoison control center or emergency room at once. NOTE: This medicine is only for you. Do not share this medicine with others. What if I miss a dose? It is important not to miss your dose. Call your doctor or health careprofessional if you are unable to keep an appointment. What may interact with this medication? This medicine may interact with the following medications: Ibuprofen This list may not describe all possible interactions. Give your health care provider a list of all the medicines, herbs, non-prescription drugs, or dietary supplements you use. Also  tell them if you smoke, drink alcohol, or use illegaldrugs. Some items may interact with your medicine. What should I watch for while using this medication? Visit your doctor for checks on your progress. This drug may make you feel generally unwell. This is not uncommon, as chemotherapy can affect healthy cells as well as cancer cells. Report any side effects. Continue your course oftreatment even though you feel ill unless your doctor tells you to stop. In some cases, you may be given additional medicines to help with side effects.Follow all directions for their use. Call your doctor or health care professional for advice if you get a fever, chills or sore throat, or other symptoms of a cold or flu. Do not treat yourself. This drug decreases your body's ability to fight infections. Try toavoid being around people who are sick. This medicine may increase your risk to bruise or bleed. Call your doctor orhealth care professional if you notice any unusual bleeding. Be careful brushing and flossing your teeth or using a toothpick because you may get an infection or bleed more easily. If you have any dental work done,tell your dentist you are receiving this medicine. Avoid taking products that contain aspirin, acetaminophen, ibuprofen, naproxen, or ketoprofen unless instructed by your doctor. These medicines may hide afever. Call  your doctor or health care professional if you get diarrhea or mouthsores. Do not treat yourself. To protect your kidneys, drink water or other fluids as directed while you aretaking this medicine. Do not become pregnant while taking this medicine or for 6 months after stopping it. Women should inform their doctor if they wish to become pregnant or think they might be pregnant. Men should not father a child while taking this medicine and for 3 months after stopping it. This may interfere with the ability to father a child. You should talk to your doctor or health care professional if  you are concerned about your fertility. There is a potential for serious side effects to an unborn child. Talk to your health care professional or pharmacist for more information. Do not breast-feed an infantwhile taking this medicine or for 1 week after stopping it. What side effects may I notice from receiving this medication? Side effects that you should report to your doctor or health care professionalas soon as possible: allergic reactions like skin rash, itching or hives, swelling of the face, lips, or tongue breathing problems redness, blistering, peeling or loosening of the skin, including inside the mouth signs and symptoms of bleeding such as bloody or black, tarry stools; red or dark-brown urine; spitting up blood or brown material that looks like coffee grounds; red spots on the skin; unusual bruising or bleeding from the eye, gums, or nose signs and symptoms of infection like fever or chills; cough; sore throat; pain or trouble passing urine signs and symptoms of kidney injury like trouble passing urine or change in the amount of urine signs and symptoms of liver injury like dark yellow or brown urine; general ill feeling or flu-like symptoms; light-colored stools; loss of appetite; nausea; right upper belly pain; unusually weak or tired; yellowing of the eyes or skin Side effects that usually do not require medical attention (report to yourdoctor or health care professional if they continue or are bothersome): constipation mouth sores nausea, vomiting unusually weak or tired This list may not describe all possible side effects. Call your doctor for medical advice about side effects. You may report side effects to FDA at1-800-FDA-1088. Where should I keep my medication? This drug is given in a hospital or clinic and will not be stored at home. NOTE: This sheet is a summary. It may not cover all possible information. If you have questions about this medicine, talk to your doctor,  pharmacist, orhealth care provider.  2022 Elsevier/Gold Standard (2017-08-20 16:11:33)  Carboplatin injection What is this medication? CARBOPLATIN (KAR boe pla tin) is a chemotherapy drug. It targets fast dividing cells, like cancer cells, and causes these cells to die. This medicine is usedto treat ovarian cancer and many other cancers. This medicine may be used for other purposes; ask your health care provider orpharmacist if you have questions. COMMON BRAND NAME(S): Paraplatin What should I tell my care team before I take this medication? They need to know if you have any of these conditions: blood disorders hearing problems kidney disease recent or ongoing radiation therapy an unusual or allergic reaction to carboplatin, cisplatin, other chemotherapy, other medicines, foods, dyes, or preservatives pregnant or trying to get pregnant breast-feeding How should I use this medication? This drug is usually given as an infusion into a vein. It is administered in [facility] or clinic by a specially trained health care professional. Talk to your pediatrician regarding the use of this medicine in children.Special care may be needed. Overdosage: If you  think you have taken too much of this medicine contact apoison control center or emergency room at once. NOTE: This medicine is only for you. Do not share this medicine with others. What if I miss a dose? It is important not to miss a dose. Call your doctor or health careprofessional if you are unable to keep an appointment. What may interact with this medication? medicines for seizures medicines to increase blood counts like filgrastim, pegfilgrastim, sargramostim some antibiotics like amikacin, gentamicin, neomycin, streptomycin, tobramycin vaccines Talk to your doctor or health care professional before taking any of thesemedicines: acetaminophen aspirin ibuprofen ketoprofen naproxen This list may not describe all possible interactions.  Give your health care provider a list of all the medicines, herbs, non-prescription drugs, or dietary supplements you use. Also tell them if you smoke, drink alcohol, or use illegaldrugs. Some items may interact with your medicine. What should I watch for while using this medication? Your condition will be monitored carefully while you are receiving this medicine. You will need important blood work done while you are taking thismedicine. This drug may make you feel generally unwell. This is not uncommon, as chemotherapy can affect healthy cells as well as cancer cells. Report any side effects. Continue your course of treatment even though you feel ill unless yourdoctor tells you to stop. In some cases, you may be given additional medicines to help with side effects.Follow all directions for their use. Call your doctor or health care professional for advice if you get a fever, chills or sore throat, or other symptoms of a cold or flu. Do not treat yourself. This drug decreases your body's ability to fight infections. Try toavoid being around people who are sick. This medicine may increase your risk to bruise or bleed. Call your doctor orhealth care professional if you notice any unusual bleeding. Be careful brushing and flossing your teeth or using a toothpick because you may get an infection or bleed more easily. If you have any dental work done,tell your dentist you are receiving this medicine. Avoid taking products that contain aspirin, acetaminophen, ibuprofen, naproxen, or ketoprofen unless instructed by your doctor. These medicines may hide afever. Do not become pregnant while taking this medicine. Women should inform their doctor if they wish to become pregnant or think they might be pregnant. There is a potential for serious side effects to an unborn child. Talk to your health care professional or pharmacist for more information. Do not breast-feed aninfant while taking this medicine. What side  effects may I notice from receiving this medication? Side effects that you should report to your doctor or health care professionalas soon as possible: allergic reactions like skin rash, itching or hives, swelling of the face, lips, or tongue signs of infection - fever or chills, cough, sore throat, pain or difficulty passing urine signs of decreased platelets or bleeding - bruising, pinpoint red spots on the skin, black, tarry stools, nosebleeds signs of decreased red blood cells - unusually weak or tired, fainting spells, lightheadedness breathing problems changes in hearing changes in vision chest pain high blood pressure low blood counts - This drug may decrease the number of white blood cells, red blood cells and platelets. You may be at increased risk for infections and bleeding. nausea and vomiting pain, swelling, redness or irritation at the injection site pain, tingling, numbness in the hands or feet problems with balance, talking, walking trouble passing urine or change in the amount of urine Side effects that usually do not require  medical attention (report to yourdoctor or health care professional if they continue or are bothersome): hair loss loss of appetite metallic taste in the mouth or changes in taste This list may not describe all possible side effects. Call your doctor for medical advice about side effects. You may report side effects to FDA at1-800-FDA-1088. Where should I keep my medication? This drug is given in a hospital or clinic and will not be stored at home. NOTE: This sheet is a summary. It may not cover all possible information. If you have questions about this medicine, talk to your doctor, pharmacist, orhealth care provider.  2022 Elsevier/Gold Standard (2007-10-06 14:38:05)

## 2021-03-05 NOTE — Progress Notes (Signed)
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  CT head revealed a right temporoparietal mass with associated vasogenic edema.  MRI brain showed multiple enhancing masses consistent with metastatic disease.  The largest lesion is the right temporal lobe with severe surrounding edema and 4 mm leftward midline shift.  CT chest abdomen pelvis with contrast showed a 7.5 cm left lower lobe mass compatible with primary bronchogenic neoplasm and small loculated left pleural effusion. 01/18/2021: establish care with Dr. Lorenso Courier 01/19/2021: bronchoscopy performed, final pathology consistent with Adenocarcinoma of the lung. NGS testing showed TPS score 50%.  02/12/2021: start of whole brain radiation/palliative radiation to the lung.  03/05/2021: Cycle 1 Day 1 of Carboplatin, pembrolizumab, and pemetrexed  Interval History:  Loretta Woods 64 y.o. female with medical history significant for metastatic adenocarcinoma of the lung who presents for a follow up visit. The patient was last seen on 02/12/2021. In the interim since the last visit she has completed whole brain radiation and palliative radiation to the lung mass.  On exam today Loretta Woods reports she has been well in the interim since her last visit.  She unfortunately has been losing weight and is down 7 pounds from her last talk.  She is doing her best to drink protein shakes but this does not appear to be increasing her weight.  She does have a bruise at the port site but otherwise notes it is not painful.  She does have problems with her dry eye from being unable to blink her right eye.  Her mother  notes that she is having some issues with lashing out and frustration and is requesting a medication to help calm her down.  She otherwise denies any fevers, chills, sweats, nausea, vomit or diarrhea.  She denies any shortness of breath or cough.  A full 10 point ROS is listed below.  A full 10 point ROS is listed below.   MEDICAL HISTORY:  Past Medical History:  Diagnosis Date   Hypertension    Thyroid disease     SURGICAL HISTORY: Past Surgical History:  Procedure Laterality Date   CRYOTHERAPY  01/19/2021   Procedure: CRYOTHERAPY;  Surgeon: Garner Nash, DO;  Location: [facility];  Service: Pulmonary;;   HEMOSTASIS CONTROL  01/19/2021   Procedure: HEMOSTASIS CONTROL;  Surgeon: Garner Nash, DO;  Location: [facility];  Service: Pulmonary;;   IR IMAGING GUIDED PORT INSERTION  02/27/2021   VIDEO BRONCHOSCOPY  01/19/2021   Procedure: VIDEO BRONCHOSCOPY WITHOUT FLUORO;  Surgeon: Garner Nash, DO;  Location: [facility];  Service: Pulmonary;;    SOCIAL HISTORY: Social History   Socioeconomic History   Marital status: Divorced    Spouse name: Not on file   Number of children: 0   Years of education: Not on file   Highest education level: Not on file  Occupational History   Not on file  Tobacco Use   Smoking status: Former    Packs/day: 1.00    Types: Cigarettes    Quit date: 2012    Years since quitting: 10.6   Smokeless tobacco: Never  Vaping Use   Vaping Use: Every day  Substance and Sexual Activity   Alcohol use: Never  Drug use: Never   Sexual activity: Not on file  Other Topics Concern   Not on file  Social History Narrative   Not on file   Social Determinants of Health   Financial Resource Strain: Low Risk    Difficulty of Paying Living Expenses: Not hard at all  Food Insecurity: No Food Insecurity   Worried About Running Out of Food in the Last Year: Never true   [place] in the Last Year: Never true  Transportation Needs: No  Transportation Needs   Lack of Transportation (Medical): No   Lack of Transportation (Non-Medical): No  Physical Activity: Not on file  Stress: Not on file  Social Connections: Not on file  Intimate Partner Violence: Not on file    FAMILY HISTORY: Family History  Problem Relation Age of Onset   Bladder Cancer Father     ALLERGIES:  has No Known Allergies.  MEDICATIONS:  Current Outpatient Medications  Medication Sig Dispense Refill   citalopram (CELEXA) 20 MG tablet Take 1 tablet (20 mg total) by mouth daily. 30 tablet 5   potassium chloride SA (KLOR-CON) 20 MEQ tablet Take 1 tablet (20 mEq total) by mouth daily. 21 tablet 3   acetaminophen (TYLENOL) 500 MG tablet Take 500 mg by mouth every 6 (six) hours as needed for moderate pain.     atorvastatin (LIPITOR) 10 MG tablet Take 10 mg by mouth daily.     cycloSPORINE (RESTASIS) 0.05 % ophthalmic emulsion Place 1 drop into both eyes 2 (two) times daily.     dexamethasone (DECADRON) 2 MG tablet Take 2 tablets TID. On 8/15 taper to 2 tabs BID. On 9/15 taper to 2 tabs daily. On 9/30 taper to 1 tab daily.  On 10/7 taper to one tab QOD. Last dose: 10/15. Take with food. 60 tablet 2   EUTHYROX 75 MCG tablet Take 75 mcg by mouth daily.     fluconazole (DIFLUCAN) 100 MG tablet Hold Atorvastatin while on this medication. Take 2 tablets today, then 1 tablet daily x 20 more days. 22 tablet 0   folic acid (FOLVITE) 1 MG tablet Take 1 tablet (1 mg total) by mouth daily. 90 tablet 3   hydrochlorothiazide (HYDRODIURIL) 50 MG tablet Take 25 mg by mouth daily.     levETIRAcetam (KEPPRA) 500 MG tablet Take 1 tablet (500 mg total) by mouth 2 (two) times daily. 60 tablet 0   lidocaine-prilocaine (EMLA) cream Apply 1 application topically as needed. 30 g 0   Multiple Vitamins-Minerals (EMERGEN-C IMMUNE) PACK Take 1 packet by mouth daily as needed (immune support).     ondansetron (ZOFRAN) 8 MG tablet Take 1 tablet (8 mg total) by mouth every 8 (eight) hours  as needed. 30 tablet 0   prochlorperazine (COMPAZINE) 10 MG tablet Take 1 tablet (10 mg total) by mouth every 6 (six) hours as needed for nausea or vomiting. 30 tablet 0   temazepam (RESTORIL) 22.5 MG capsule Take 1 capsule (22.5 mg total) by mouth at bedtime as needed for sleep. 30 capsule 0   No current facility-administered medications for this visit.   Facility-Administered Medications Ordered in Other Visits  Medication Dose Route Frequency Provider Last Rate Last Admin   heparin lock flush 100 unit/mL  500 Units Intracatheter Once PRN Ledell Peoples IV, MD       potassium chloride 10 mEq in 100 mL IVPB  10 mEq Intravenous Q1 Hr x 2 Orson Slick, MD  potassium chloride 10 mEq in 100 mL IVPB  10 mEq Intravenous Q1 Hr x 2 Ledell Peoples IV, MD 100 mL/hr at 03/05/21 1623 10 mEq at 03/05/21 1623   sodium chloride flush (NS) 0.9 % injection 10 mL  10 mL Intracatheter PRN Orson Slick, MD        REVIEW OF SYSTEMS:   Constitutional: ( - ) fevers, ( - )  chills , ( - ) night sweats Eyes: ( - ) blurriness of vision, ( - ) double vision, ( - ) watery eyes Ears, nose, mouth, throat, and face: ( - ) mucositis, ( - ) sore throat Respiratory: ( - ) cough, ( - ) dyspnea, ( - ) wheezes Cardiovascular: ( - ) palpitation, ( - ) chest discomfort, ( - ) lower extremity swelling Gastrointestinal:  ( - ) nausea, ( - ) heartburn, ( - ) change in bowel habits Skin: ( - ) abnormal skin rashes Lymphatics: ( - ) new lymphadenopathy, ( - ) easy bruising Neurological: ( - ) numbness, ( - ) tingling, ( - ) new weaknesses Behavioral/Psych: ( - ) mood change, ( - ) new changes  All other systems were reviewed with the patient and are negative.  PHYSICAL EXAMINATION: ECOG PERFORMANCE STATUS: 1 - Symptomatic but completely ambulatory  Vitals:   03/05/21 1146  BP: (!) 151/93  Pulse: 100  Resp: 18  Temp: 97.6 F (36.4 C)  SpO2: 100%    Filed Weights   03/05/21 1146  Weight: 87 lb 8 oz (39.7  kg)     GENERAL: Well-appearing middle-age Caucasian female, alert, no distress and comfortable SKIN: skin color, texture, turgor are normal, no rashes or significant lesions EYES: conjunctiva are pink and non-injected, sclera clear LUNGS: clear to auscultation and percussion with normal breathing effort HEART: regular rate & rhythm and no murmurs and no lower extremity edema PSYCH: alert & oriented x 3, fluent speech NEURO: no focal motor/sensory deficits  LABORATORY DATA:  I have reviewed the data as listed CBC Latest Ref Rng & Units 03/05/2021 02/12/2021 01/29/2021  WBC 4.0 - 10.5 K/uL 8.8 36.5(H) 43.2(H)  Hemoglobin 12.0 - 15.0 g/dL 12.4 14.1 15.9(H)  Hematocrit 36.0 - 46.0 % 34.7(L) 41.0 45.1  Platelets 150 - 400 K/uL 119(L) 303 437(H)    CMP Latest Ref Rng & Units 03/05/2021 02/12/2021 01/29/2021  Glucose 70 - 99 mg/dL 220(H) 126(H) 108(H)  BUN 8 - 23 mg/dL 29(H) 22 27(H)  Creatinine 0.44 - 1.00 mg/dL 0.65 0.67 0.71  Sodium 135 - 145 mmol/L 135 130(L) 131(L)  Potassium 3.5 - 5.1 mmol/L 2.8(L) 3.4(L) 3.8  Chloride 98 - 111 mmol/L 95(L) 93(L) 92(L)  CO2 22 - 32 mmol/L '29 28 29  ' Calcium 8.9 - 10.3 mg/dL 8.7(L) 8.9 9.4  Total Protein 6.5 - 8.1 g/dL 5.6(L) 5.8(L) 6.8  Total Bilirubin 0.3 - 1.2 mg/dL 0.6 0.6 0.6  Alkaline Phos 38 - 126 U/L 72 78 95  AST 15 - 41 U/L 8(L) 12(L) 15  ALT 0 - 44 U/L 29 40 34   RADIOGRAPHIC STUDIES: I have personally reviewed the radiological images as listed and agreed with the findings in the report: Large left lower lobe lung mass with pleural effusion.  Review of MRI shows multiple brain lesions consistent with metastatic disease. MR Brain W Wo Contrast  Addendum Date: 02/06/2021   ADDENDUM REPORT: 02/06/2021 19:18 ADDENDUM: These results will be called to the ordering clinician or representative by the Radiologist Assistant, and  communication documented in the PACS or [employer]. Electronically Signed   By: Margaretha Sheffield MD   On: 02/06/2021  19:18   Addendum Date: 02/06/2021   ADDENDUM REPORT: 02/06/2021 18:46 ADDENDUM: In addition to the areas described in impression #1, there also are multiple areas of restricted diffusion in the superior left cerebellum that likely represent acute/subacute infarcts. Electronically Signed   By: Margaretha Sheffield MD   On: 02/06/2021 18:46   Result Date: 02/06/2021 CLINICAL DATA:  Brain/CNS neoplasm.  Treatment planning. EXAM: MRI HEAD WITHOUT AND WITH CONTRAST TECHNIQUE: Multiplanar, multiecho pulse sequences of the brain and surrounding structures were obtained without and with intravenous contrast. CONTRAST:  51m MULTIHANCE GADOBENATE DIMEGLUMINE 529 MG/ML IV SOLN COMPARISON:  MRI 01/17/2021. FINDINGS: Brain: New areas of restricted diffusion in the left cerebellum, left pons, bilateral thalami, left frontal and bilateral parieto-occipital cortex. No hydrocephalus, acute hemorrhage, extra-axial fluid collection. Numerous enhancing metastatic lesions, annotated and described on series 14. Possible new lesions: *An ill-defined area of enhancement in the left cerebellum as restricted diffusion and may represent infarct or metastatic disease. *New ill-defined enhancement along the left tentorial leaflet (64), potentially a dural metastasis. *Multiple punctate lesions in the more inferior occipital lobe and in the right parieto-occipital region (images 80, 75) which may be new. *Punctate lesion in the anterior left frontal cortex (95) is more conspicuous on this study, but maybe present on the prior. *Punctate lesion in the lateral left temporal lobe, likely new. *The enhancing lesion in the left occipital of (59), likely new. *Punctate lesion and the anterior left temporal lobe (86), likely new. *Punctate lesion in the inferior right cerebellum (29), likely new. Similar or increased lesions: *Approximately 20 mm lesion in the anterolateral right temporal of (78), not substantially changed *Approximately 46 mm lesion  in the more posterior right temporal lobe (68) with central necrosis and extension inferiorly along the petrous temporal bone, suggesting possible dural extension. *5 mm lesion in the high left parasagittal frontal lobe is increased in size (119). *Wispy lesion in the more medial anterior left frontal lobe is slightly increased in bulk (97). *Punctate lesion in the right parieto-occipital region (92) is similar versus slightly more conspicuous. *4 mm lesion in the posterior and lateral right temporal lobe (86), likely slightly increased. *Punctate lesion in the more lateral left cerebellum (42) appears similar. *Punctate lesion in the anterior right frontal lobe (88), likely similar. *5 mm lesion in the left temporal occipital region (61) appears similar versus slightly increased. *Approximately 6 mm lesions in bilateral occipital lobes (74) similar versus slightly increased. Edema and mass effect associated with the dominant lesions in the right temporal lobe is improved with decreased leftward midline shift, now 3 mm at the foramen of [place]. Vascular: Major arterial flow voids are maintained at the skull base. Skull and upper cervical spine: Normal marrow signal. Degenerative changes of the upper cervical spine. Sinuses/Orbits: Clear sinuses.  Unremarkable orbits. Other: Small left mastoid effusion. IMPRESSION: 1. New areas of restricted diffusion in the left pons, bilateral thalami, left frontal and bilateral parieto-occipital cortex, concerning for acute or early subacute infarcts. Some of the areas (including an additional ill-defined area of enhancement and restricted diffusion in the left cerebellum) may represent enhancing infarct or metastatic disease. These findings warrant attention on short interval follow-up to differentiate evolving infarct from metastasis. 2. Redemonstrated numerous infratentorial and supratentorial metastases, including suspected new metastases and likely slightly increased  size/conspicuity of many of the metastases, as detailed above. The increased  conspicuity of some of the lesions may be in part due to improved technique on this study. The largest lesion in the right temporal lobe and extends along the petrous temporal bone inferiorly. 3. Edema and mass effect associated with the dominant lesions in the right temporal lobe is improved with decreased leftward midline shift, now 3 mm at the foramen of [place]. Electronically Signed: By: Margaretha Sheffield MD On: 02/06/2021 18:27   IR IMAGING GUIDED PORT INSERTION  Result Date: 02/27/2021 INDICATION: 65 year old with left lung cancer. Port-A-Cath needed for chemotherapy. EXAM: FLUOROSCOPIC AND ULTRASOUND GUIDED PLACEMENT OF A SUBCUTANEOUS PORT COMPARISON:  None. MEDICATIONS: Moderate sedation ANESTHESIA/SEDATION: Versed 1.0 mg IV; Fentanyl 50 mcg IV; Moderate Sedation Time:  25 minutes The patient was continuously monitored during the procedure by the interventional radiology nurse under my direct supervision. FLUOROSCOPY TIME:  42 seconds, 2 mGy COMPLICATIONS: None immediate. PROCEDURE: The procedure, risks, benefits, and alternatives were explained to the patient. Questions regarding the procedure were encouraged and answered. The patient understands and consents to the procedure. Patient was placed supine on the interventional table. Ultrasound confirmed a patent right internal jugular vein. Ultrasound image was saved for documentation. The right chest and neck were cleaned with a skin antiseptic and a sterile drape was placed. Maximal barrier sterile technique was utilized including caps, mask, sterile gowns, sterile gloves, sterile drape, hand hygiene and skin antiseptic. The right neck was anesthetized with 1% lidocaine. Small incision was made in the right neck with a blade. Micropuncture set was placed in the right internal jugular vein with ultrasound guidance. The micropuncture wire was used for measurement purposes. The  right chest was anesthetized with 1% lidocaine with epinephrine. #15 blade was used to make an incision and a subcutaneous port pocket was formed. Soper was assembled. Subcutaneous tunnel was formed with a stiff tunneling device. The port catheter was brought through the subcutaneous tunnel. The port was placed in the subcutaneous pocket. The micropuncture set was exchanged for a peel-away sheath. The catheter was placed through the peel-away sheath and the tip was positioned at the superior cavoatrial junction. Catheter placement was confirmed with fluoroscopy. The port was accessed and flushed with heparinized saline. The port pocket was closed using two layers of absorbable sutures and Dermabond. The vein skin site was closed using a single layer of absorbable suture and Dermabond. Sterile dressings were applied. Patient tolerated the procedure well without an immediate complication. Ultrasound and fluoroscopic images were taken and saved for this procedure. IMPRESSION: Placement of a subcutaneous power-injectable port device. Catheter tip at the superior cavoatrial junction. Electronically Signed   By: Markus Daft M.D.   On: 02/27/2021 17:33    ASSESSMENT & PLAN Junie Spencer 65 y.o. female with medical history significant for metastatic adenocarcinoma of the lung who presents for a follow up visit.  After review of the labs, review of the records, discussion with the patient the findings most consistent with metastatic adenocarcinoma of the lung with spread to the brain.  There is also an associated malignant pleural effusion.  Tap of the pleural effusion did not reveal cancer cells, though these are often quite low yield.  Given these findings I do believe the patient will require systemic therapy.    The patient connected with Dr. Isidore Moos and radiation oncology and proceeded with palliative radiation to the lung mass and whole brain radiation.  The patient's targetable infusion panel showed no  clear targets.  She did have a PD-L1 score  of 50%.  Given these findings we will plan to proceed with carboplatin, pembrolizumab, pemetrexed as first-line therapy.  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 Isidore Moos.  --plan for Carbo/Pem/Pem chemotherapy until progression or intolerance --Guardant 360 pending.  Foundation 1 showed a PD-L1 score of 50%.  Plan: --plan for Carbo/Pem/Pem chemotherapy until progression or intolerance. Carbo to be dropped after Cycle 4 --repeat CT C/A/P in Oct 2022. MRI brain per Rad/onc --Return to clinic in 3 weeks for Cycle 2 of chemotherapy.  #Supportive Care -- chemotherapy education complete -- port placed -- zofran 59m q8H PRN and compazine 148mPO q6H for nausea -- EMLA cream for port -- no pain medication required at this time.   No orders of the defined types were placed in this encounter.   All questions were answered. The patient knows to call the clinic with any problems, questions or concerns.  A total of more than 30 minutes were spent on this encounter with face-to-face time and non-face-to-face time, including preparing to see the patient, ordering tests and/or medications, counseling the patient and coordination of care as outlined above.   JoLedell PeoplesMD Department of Hematology/Oncology CoNew Viennat WeLake Charles Memorial Hospitalhone: [phone]: [phone]: [email or web address]  03/05/2021 5:13 PM

## 2021-03-08 ENCOUNTER — Encounter: Payer: Self-pay | Admitting: Pulmonary Disease

## 2021-03-08 ENCOUNTER — Ambulatory Visit (INDEPENDENT_AMBULATORY_CARE_PROVIDER_SITE_OTHER): Payer: 59 | Admitting: Pulmonary Disease

## 2021-03-08 ENCOUNTER — Other Ambulatory Visit: Payer: Self-pay

## 2021-03-08 VITALS — BP 122/70 | HR 96 | Temp 97.9°F | Ht 62.0 in | Wt 87.8 lb

## 2021-03-08 DIAGNOSIS — C3432 Malignant neoplasm of lower lobe, left bronchus or lung: Secondary | ICD-10-CM | POA: Diagnosis not present

## 2021-03-08 DIAGNOSIS — R918 Other nonspecific abnormal finding of lung field: Secondary | ICD-10-CM

## 2021-03-08 DIAGNOSIS — G9389 Other specified disorders of brain: Secondary | ICD-10-CM | POA: Diagnosis not present

## 2021-03-08 DIAGNOSIS — C3492 Malignant neoplasm of unspecified part of left bronchus or lung: Secondary | ICD-10-CM | POA: Diagnosis not present

## 2021-03-08 NOTE — Patient Instructions (Signed)
Thank you for visiting Dr. Valeta Harms at [facility]. Today we recommend the following:  Continue follow-up with medical oncology and radiation oncology  Return if symptoms worsen or fail to improve.    Please do your part to reduce the spread of COVID-19.

## 2021-03-08 NOTE — Progress Notes (Signed)
Synopsis: Referred in July 2022 for pleural effusion, brain mass, advanced age malignancy, initially seen in the hospital, oncologist Dr. Lorenso Courier, radiation oncologist Dr. Isidore Moos, PCP: By No ref. provider found  Subjective:   PATIENT ID: Loretta Woods GENDER: female DOB: [birth year], MRN: [id number]  Chief Complaint  Patient presents with   Follow-up    Pt had bronch performed 7/7 while she was admitted at at the hospital.  Pt states she has had a rough time since she has been out of the hospital. States she has been having problems with headaches. States that her breathing has been doing okay and denies any complaints of chest discomfort.    This is a 65 year old female, past medical history of hypertension, thyroid disease.I initially met during a hospitalization in July 2022 patient had consultation on 01/18/2021 by Dr. Silas Flood.  There was concern for stroke on admission however found to have a large left lower lung mass, small pleural effusion and adenopathy.  Additionally was found to have a brain mass with vasogenic edema concerning for advanced age bronchogenic carcinoma.  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  Patient underwent left mainstem endobronchial biopsies.  Pathology was consistent with adenocarcinoma of the lung.  Subsequently she has establish care with Medical oncology and radiation oncology and has started treatments.  Last seen by medical oncology on 03/05/2021, Dr. Lorenso Courier, office note reviewed.  Last chest x-ray on 01/18/2021 revealed left lung was unchanged due to mass.  Status postthoracentesis.  Thoracentesis fluid revealed reactive mesothelial cells and no evidence of malignancy.  OV 03/08/2021: Here today for follow-up after hospitalization, bronchoscopy and start of chemotherapy and radiation.   Oncology History  Endobronchial cancer, left ([place])  01/19/2021 Initial Diagnosis   Endobronchial cancer, left  ([place])   01/29/2021 Cancer Staging   Staging form: Lung, AJCC 8th Edition - Clinical: Stage IV (cT4, cN2, cM1) - Signed by Orson Slick, MD on 01/29/2021   Malignant neoplasm of lower lobe of left lung ([place])  02/12/2021 Initial Diagnosis   Malignant neoplasm of lower lobe of left lung ([place])   03/05/2021 -  Chemotherapy    Patient is on Treatment Plan: LUNG CARBOPLATIN / PEMETREXED / PEMBROLIZUMAB Q21D INDUCTION X 4 CYCLES / MAINTENANCE PEMETREXED + PEMBROLIZUMAB          Past Medical History:  Diagnosis Date   Hypertension    Thyroid disease      Family History  Problem Relation Age of Onset   Bladder Cancer Father      Past Surgical History:  Procedure Laterality Date   CRYOTHERAPY  01/19/2021   Procedure: CRYOTHERAPY;  Surgeon: Garner Nash, DO;  Location: [facility];  Service: Pulmonary;;   HEMOSTASIS CONTROL  01/19/2021   Procedure: HEMOSTASIS CONTROL;  Surgeon: Garner Nash, DO;  Location: [facility];  Service: Pulmonary;;   IR IMAGING GUIDED PORT INSERTION  02/27/2021   VIDEO BRONCHOSCOPY  01/19/2021   Procedure: VIDEO BRONCHOSCOPY WITHOUT FLUORO;  Surgeon: Garner Nash, DO;  Location: [facility];  Service: Pulmonary;;    Social History   Socioeconomic History   Marital status: Divorced    Spouse name: Not on file   Number of children: 0   Years of education: Not on file   Highest education level: Not on file  Occupational History   Not on file  Tobacco Use   Smoking status: Former    Packs/day: 1.50    Years:  37.00    Pack years: 55.50    Types: Cigarettes    Start date: 07/15/1973    Quit date: 2012    Years since quitting: 10.6   Smokeless tobacco: Never  Vaping Use   Vaping Use: Every day  Substance and Sexual Activity   Alcohol use: Never   Drug use: Never   Sexual activity: Not on file  Other Topics Concern   Not on file  Social History Narrative   Not on file   Social Determinants of Health   Financial Resource Strain: Low  Risk    Difficulty of Paying Living Expenses: Not hard at all  Food Insecurity: No Food Insecurity   Worried About Charity fundraiser in the Last Year: Never true   [place] in the Last Year: Never true  Transportation Needs: No Transportation Needs   Lack of Transportation (Medical): No   Lack of Transportation (Non-Medical): No  Physical Activity: Not on file  Stress: Not on file  Social Connections: Not on file  Intimate Partner Violence: Not on file     No Known Allergies   Outpatient Medications Prior to Visit  Medication Sig Dispense Refill   acetaminophen (TYLENOL) 500 MG tablet Take 500 mg by mouth every 6 (six) hours as needed for moderate pain.     atorvastatin (LIPITOR) 10 MG tablet Take 10 mg by mouth daily.     citalopram (CELEXA) 20 MG tablet Take 1 tablet (20 mg total) by mouth daily. 30 tablet 5   dexamethasone (DECADRON) 2 MG tablet Take 2 tablets TID. On 8/15 taper to 2 tabs BID. On 9/15 taper to 2 tabs daily. On 9/30 taper to 1 tab daily.  On 10/7 taper to one tab QOD. Last dose: 10/15. Take with food. 60 tablet 2   EUTHYROX 75 MCG tablet Take 75 mcg by mouth daily.     fluconazole (DIFLUCAN) 100 MG tablet Hold Atorvastatin while on this medication. Take 2 tablets today, then 1 tablet daily x 20 more days. 22 tablet 0   folic acid (FOLVITE) 1 MG tablet Take 1 tablet (1 mg total) by mouth daily. 90 tablet 3   hydrochlorothiazide (HYDRODIURIL) 50 MG tablet Take 25 mg by mouth daily.     levETIRAcetam (KEPPRA) 500 MG tablet Take 1 tablet (500 mg total) by mouth 2 (two) times daily. 60 tablet 0   lidocaine-prilocaine (EMLA) cream Apply 1 application topically as needed. 30 g 0   Multiple Vitamins-Minerals (EMERGEN-C IMMUNE) PACK Take 1 packet by mouth daily as needed (immune support).     ondansetron (ZOFRAN) 8 MG tablet Take 1 tablet (8 mg total) by mouth every 8 (eight) hours as needed. 30 tablet 0   potassium chloride SA (KLOR-CON) 20 MEQ tablet Take 1 tablet  (20 mEq total) by mouth daily. 21 tablet 3   prochlorperazine (COMPAZINE) 10 MG tablet Take 1 tablet (10 mg total) by mouth every 6 (six) hours as needed for nausea or vomiting. 30 tablet 0   temazepam (RESTORIL) 22.5 MG capsule Take 1 capsule (22.5 mg total) by mouth at bedtime as needed for sleep. 30 capsule 0   cycloSPORINE (RESTASIS) 0.05 % ophthalmic emulsion Place 1 drop into both eyes 2 (two) times daily.     No facility-administered medications prior to visit.    Review of Systems  Constitutional:  Positive for malaise/fatigue and weight loss. Negative for chills and fever.  HENT:  Negative for hearing loss, sore throat and  tinnitus.   Eyes:  Negative for blurred vision and double vision.  Respiratory:  Positive for cough and shortness of breath. Negative for hemoptysis, sputum production, wheezing and stridor.   Cardiovascular:  Negative for chest pain, palpitations, orthopnea, leg swelling and PND.  Gastrointestinal:  Negative for abdominal pain, constipation, diarrhea, heartburn, nausea and vomiting.  Genitourinary:  Negative for dysuria, hematuria and urgency.  Musculoskeletal:  Positive for back pain. Negative for joint pain and myalgias.  Skin:  Negative for itching and rash.  Neurological:  Negative for dizziness, tingling, weakness and headaches.  Endo/Heme/Allergies:  Negative for environmental allergies. Does not bruise/bleed easily.  Psychiatric/Behavioral:  Negative for depression. The patient is nervous/anxious. The patient does not have insomnia.   All other systems reviewed and are negative.   Objective:  Physical Exam Vitals reviewed.  Constitutional:      General: She is not in acute distress.    Appearance: She is well-developed.     Comments: Frail debilitated  HENT:     Head: Normocephalic and atraumatic.     Mouth/Throat:     Pharynx: No oropharyngeal exudate.  Eyes:     Conjunctiva/sclera: Conjunctivae normal.     Pupils: Pupils are equal, round, and  reactive to light.  Neck:     Vascular: No JVD.     Trachea: No tracheal deviation.     Comments: Loss of supraclavicular fat Cardiovascular:     Rate and Rhythm: Normal rate and regular rhythm.     Heart sounds: S1 normal and S2 normal.     Comments: Distant heart tones Pulmonary:     Effort: No tachypnea or accessory muscle usage.     Breath sounds: No stridor. Decreased breath sounds (throughout all lung fields) present. No wheezing, rhonchi or rales.  Abdominal:     General: Bowel sounds are normal. There is no distension.     Palpations: Abdomen is soft.     Tenderness: There is no abdominal tenderness.  Musculoskeletal:        General: Deformity (muscle wasting ) present.  Skin:    General: Skin is warm and dry.     Capillary Refill: Capillary refill takes less than 2 seconds.     Findings: No rash.  Neurological:     Mental Status: She is alert and oriented to person, place, and time.  Psychiatric:        Behavior: Behavior normal.     Vitals:   03/08/21 0941  BP: 122/70  Pulse: 96  Temp: 97.9 F (36.6 C)  TempSrc: Oral  SpO2: 97%  Weight: 87 lb 12.8 oz (39.8 kg)  Height: $Remove'5\' 2"'vojtGwN$  (1.575 m)   97% on RA BMI Readings from Last 3 Encounters:  03/08/21 16.06 kg/m  03/05/21 16.00 kg/m  02/12/21 17.28 kg/m   Wt Readings from Last 3 Encounters:  03/08/21 87 lb 12.8 oz (39.8 kg)  03/05/21 87 lb 8 oz (39.7 kg)  02/12/21 94 lb 8 oz (42.9 kg)    CBC    Component Value Date/Time   WBC 8.8 03/05/2021 1030   WBC 9.7 01/17/2021 1803   RBC 4.09 03/05/2021 1030   HGB 12.4 03/05/2021 1030   HCT 34.7 (L) 03/05/2021 1030   PLT 119 (L) 03/05/2021 1030   MCV 84.8 03/05/2021 1030   MCH 30.3 03/05/2021 1030   MCHC 35.7 03/05/2021 1030   RDW 13.2 03/05/2021 1030   LYMPHSABS 0.6 (L) 03/05/2021 1030   MONOABS 0.4 03/05/2021 1030   EOSABS 0.0  03/05/2021 1030   BASOSABS 0.0 03/05/2021 1030     Chest Imaging: 01/18/2021 chest x-ray: Resolved pleural  effusion  Pulmonary Functions Testing Results: No flowsheet data found.  FeNO:   Pathology:   Echocardiogram:   Heart Catheterization:     Assessment & Plan:     ICD-10-CM   1. Lung mass  R91.8     2. Endobronchial cancer, left ([place])  C34.92     3. Malignant neoplasm of lower lobe of left lung ([facility])  C34.32     4. Brain mass  G93.89     5. Adenocarcinoma of left lung, stage 4 ([facility])  C34.92       Discussion:  This is a 65 year old female with advanced stage adenocarcinoma, brain metastasis.  Currently undergoing radiation treatments and chemotherapy.  Plan: Here today to see how she is doing from a respiratory standpoint.  She has a significant decline in her ADLs. Not significantly short of breath. We discussed goals of care today. I suspect this needs to be continually addressed with her as well as with medical oncology. I think she can return to see [place] as needed. If we can be of any help oncology can reach out to [place] for any questions or concerns.   Current Outpatient Medications:    acetaminophen (TYLENOL) 500 MG tablet, Take 500 mg by mouth every 6 (six) hours as needed for moderate pain., Disp: , Rfl:    atorvastatin (LIPITOR) 10 MG tablet, Take 10 mg by mouth daily., Disp: , Rfl:    citalopram (CELEXA) 20 MG tablet, Take 1 tablet (20 mg total) by mouth daily., Disp: 30 tablet, Rfl: 5   dexamethasone (DECADRON) 2 MG tablet, Take 2 tablets TID. On 8/15 taper to 2 tabs BID. On 9/15 taper to 2 tabs daily. On 9/30 taper to 1 tab daily.  On 10/7 taper to one tab QOD. Last dose: 10/15. Take with food., Disp: 60 tablet, Rfl: 2   EUTHYROX 75 MCG tablet, Take 75 mcg by mouth daily., Disp: , Rfl:    fluconazole (DIFLUCAN) 100 MG tablet, Hold Atorvastatin while on this medication. Take 2 tablets today, then 1 tablet daily x 20 more days., Disp: 22 tablet, Rfl: 0   folic acid (FOLVITE) 1 MG tablet, Take 1 tablet (1 mg total) by mouth daily., Disp: 90 tablet, Rfl: 3    hydrochlorothiazide (HYDRODIURIL) 50 MG tablet, Take 25 mg by mouth daily., Disp: , Rfl:    levETIRAcetam (KEPPRA) 500 MG tablet, Take 1 tablet (500 mg total) by mouth 2 (two) times daily., Disp: 60 tablet, Rfl: 0   lidocaine-prilocaine (EMLA) cream, Apply 1 application topically as needed., Disp: 30 g, Rfl: 0   Multiple Vitamins-Minerals (EMERGEN-C IMMUNE) PACK, Take 1 packet by mouth daily as needed (immune support)., Disp: , Rfl:    ondansetron (ZOFRAN) 8 MG tablet, Take 1 tablet (8 mg total) by mouth every 8 (eight) hours as needed., Disp: 30 tablet, Rfl: 0   potassium chloride SA (KLOR-CON) 20 MEQ tablet, Take 1 tablet (20 mEq total) by mouth daily., Disp: 21 tablet, Rfl: 3   prochlorperazine (COMPAZINE) 10 MG tablet, Take 1 tablet (10 mg total) by mouth every 6 (six) hours as needed for nausea or vomiting., Disp: 30 tablet, Rfl: 0   temazepam (RESTORIL) 22.5 MG capsule, Take 1 capsule (22.5 mg total) by mouth at bedtime as needed for sleep., Disp: 30 capsule, Rfl: 0    Garner Nash, DO  Pulmonary Critical Care 03/08/2021 10:08  AM

## 2021-03-09 ENCOUNTER — Other Ambulatory Visit: Payer: Self-pay | Admitting: Radiation Oncology

## 2021-03-09 DIAGNOSIS — C3490 Malignant neoplasm of unspecified part of unspecified bronchus or lung: Secondary | ICD-10-CM

## 2021-03-13 ENCOUNTER — Telehealth: Payer: Self-pay | Admitting: *Deleted

## 2021-03-13 NOTE — Telephone Encounter (Signed)
Retrieved late vm message from pt's mother stating pt is having some blood in her urine but not c/o's of burning or fever. She is slightly confused & is asking about some cream for her lips that she can't find.  Informed mother that if she needs to call 911 or go to ED tonight if problems get worse to do so but will let Dr Dorsey/Pod RN know in am to check on her.  Message routed.

## 2021-03-14 ENCOUNTER — Emergency Department (HOSPITAL_COMMUNITY): Payer: 59

## 2021-03-14 ENCOUNTER — Telehealth: Payer: Self-pay | Admitting: *Deleted

## 2021-03-14 ENCOUNTER — Other Ambulatory Visit: Payer: Self-pay

## 2021-03-14 ENCOUNTER — Other Ambulatory Visit (HOSPITAL_COMMUNITY): Payer: Self-pay

## 2021-03-14 ENCOUNTER — Inpatient Hospital Stay (HOSPITAL_COMMUNITY): Payer: 59

## 2021-03-14 ENCOUNTER — Inpatient Hospital Stay (HOSPITAL_COMMUNITY)
Admission: EM | Admit: 2021-03-14 | Discharge: 2021-03-29 | DRG: 871 | Disposition: A | Discharge status: Skilled Nursing Facility | Payer: 59 | Attending: Internal Medicine | Admitting: Internal Medicine

## 2021-03-14 ENCOUNTER — Encounter (HOSPITAL_COMMUNITY): Payer: Self-pay

## 2021-03-14 DIAGNOSIS — C3492 Malignant neoplasm of unspecified part of left bronchus or lung: Secondary | ICD-10-CM | POA: Diagnosis present

## 2021-03-14 DIAGNOSIS — D630 Anemia in neoplastic disease: Secondary | ICD-10-CM | POA: Diagnosis present

## 2021-03-14 DIAGNOSIS — Z1611 Resistance to penicillins: Secondary | ICD-10-CM | POA: Diagnosis present

## 2021-03-14 DIAGNOSIS — G40909 Epilepsy, unspecified, not intractable, without status epilepticus: Secondary | ICD-10-CM | POA: Diagnosis present

## 2021-03-14 DIAGNOSIS — I10 Essential (primary) hypertension: Secondary | ICD-10-CM | POA: Diagnosis present

## 2021-03-14 DIAGNOSIS — Z515 Encounter for palliative care: Secondary | ICD-10-CM | POA: Diagnosis not present

## 2021-03-14 DIAGNOSIS — E039 Hypothyroidism, unspecified: Secondary | ICD-10-CM | POA: Diagnosis present

## 2021-03-14 DIAGNOSIS — D72829 Elevated white blood cell count, unspecified: Secondary | ICD-10-CM | POA: Diagnosis not present

## 2021-03-14 DIAGNOSIS — E785 Hyperlipidemia, unspecified: Secondary | ICD-10-CM | POA: Diagnosis present

## 2021-03-14 DIAGNOSIS — E871 Hypo-osmolality and hyponatremia: Secondary | ICD-10-CM | POA: Diagnosis not present

## 2021-03-14 DIAGNOSIS — F17290 Nicotine dependence, other tobacco product, uncomplicated: Secondary | ICD-10-CM | POA: Diagnosis present

## 2021-03-14 DIAGNOSIS — B964 Proteus (mirabilis) (morganii) as the cause of diseases classified elsewhere: Secondary | ICD-10-CM | POA: Diagnosis present

## 2021-03-14 DIAGNOSIS — Z66 Do not resuscitate: Secondary | ICD-10-CM | POA: Diagnosis present

## 2021-03-14 DIAGNOSIS — C7931 Secondary malignant neoplasm of brain: Secondary | ICD-10-CM | POA: Diagnosis present

## 2021-03-14 DIAGNOSIS — E876 Hypokalemia: Secondary | ICD-10-CM | POA: Diagnosis present

## 2021-03-14 DIAGNOSIS — J910 Malignant pleural effusion: Secondary | ICD-10-CM | POA: Diagnosis present

## 2021-03-14 DIAGNOSIS — Z7189 Other specified counseling: Secondary | ICD-10-CM | POA: Diagnosis not present

## 2021-03-14 DIAGNOSIS — G9341 Metabolic encephalopathy: Secondary | ICD-10-CM | POA: Diagnosis present

## 2021-03-14 DIAGNOSIS — Y929 Unspecified place or not applicable: Secondary | ICD-10-CM | POA: Diagnosis not present

## 2021-03-14 DIAGNOSIS — G936 Cerebral edema: Secondary | ICD-10-CM | POA: Diagnosis present

## 2021-03-14 DIAGNOSIS — T380X5A Adverse effect of glucocorticoids and synthetic analogues, initial encounter: Secondary | ICD-10-CM | POA: Diagnosis not present

## 2021-03-14 DIAGNOSIS — L89320 Pressure ulcer of left buttock, unstageable: Secondary | ICD-10-CM | POA: Diagnosis present

## 2021-03-14 DIAGNOSIS — Z20822 Contact with and (suspected) exposure to covid-19: Secondary | ICD-10-CM | POA: Diagnosis present

## 2021-03-14 DIAGNOSIS — L89310 Pressure ulcer of right buttock, unstageable: Secondary | ICD-10-CM | POA: Diagnosis present

## 2021-03-14 DIAGNOSIS — E870 Hyperosmolality and hypernatremia: Secondary | ICD-10-CM | POA: Diagnosis not present

## 2021-03-14 DIAGNOSIS — R64 Cachexia: Secondary | ICD-10-CM | POA: Diagnosis present

## 2021-03-14 DIAGNOSIS — T451X5A Adverse effect of antineoplastic and immunosuppressive drugs, initial encounter: Secondary | ICD-10-CM | POA: Diagnosis present

## 2021-03-14 DIAGNOSIS — Z79899 Other long term (current) drug therapy: Secondary | ICD-10-CM

## 2021-03-14 DIAGNOSIS — Y92239 Unspecified place in hospital as the place of occurrence of the external cause: Secondary | ICD-10-CM | POA: Diagnosis not present

## 2021-03-14 DIAGNOSIS — R29810 Facial weakness: Secondary | ICD-10-CM | POA: Diagnosis present

## 2021-03-14 DIAGNOSIS — Z8052 Family history of malignant neoplasm of bladder: Secondary | ICD-10-CM | POA: Diagnosis not present

## 2021-03-14 DIAGNOSIS — D61818 Other pancytopenia: Secondary | ICD-10-CM | POA: Diagnosis present

## 2021-03-14 DIAGNOSIS — B9620 Unspecified Escherichia coli [E. coli] as the cause of diseases classified elsewhere: Secondary | ICD-10-CM | POA: Diagnosis present

## 2021-03-14 DIAGNOSIS — B9689 Other specified bacterial agents as the cause of diseases classified elsewhere: Secondary | ICD-10-CM | POA: Diagnosis present

## 2021-03-14 DIAGNOSIS — Z681 Body mass index (BMI) 19 or less, adult: Secondary | ICD-10-CM | POA: Diagnosis not present

## 2021-03-14 DIAGNOSIS — Z923 Personal history of irradiation: Secondary | ICD-10-CM

## 2021-03-14 DIAGNOSIS — L309 Dermatitis, unspecified: Secondary | ICD-10-CM | POA: Diagnosis present

## 2021-03-14 DIAGNOSIS — H02402 Unspecified ptosis of left eyelid: Secondary | ICD-10-CM | POA: Diagnosis present

## 2021-03-14 DIAGNOSIS — R7881 Bacteremia: Principal | ICD-10-CM | POA: Diagnosis present

## 2021-03-14 DIAGNOSIS — R531 Weakness: Secondary | ICD-10-CM | POA: Diagnosis not present

## 2021-03-14 LAB — COMPREHENSIVE METABOLIC PANEL
ALT: 22 U/L (ref 0–44)
AST: 15 U/L (ref 15–41)
Albumin: 2.6 g/dL — ABNORMAL LOW (ref 3.5–5.0)
Alkaline Phosphatase: 71 U/L (ref 38–126)
Anion gap: 10 (ref 5–15)
BUN: 18 mg/dL (ref 8–23)
CO2: 28 mmol/L (ref 22–32)
Calcium: 8.4 mg/dL — ABNORMAL LOW (ref 8.9–10.3)
Chloride: 100 mmol/L (ref 98–111)
Creatinine, Ser: <0.3 mg/dL — ABNORMAL LOW (ref 0.44–1.00)
Glucose, Bld: 96 mg/dL (ref 70–99)
Potassium: 3.3 mmol/L — ABNORMAL LOW (ref 3.5–5.1)
Sodium: 138 mmol/L (ref 135–145)
Total Bilirubin: 0.8 mg/dL (ref 0.3–1.2)
Total Protein: 5.9 g/dL — ABNORMAL LOW (ref 6.5–8.1)

## 2021-03-14 LAB — CBC WITH DIFFERENTIAL/PLATELET
Abs Immature Granulocytes: 0.01 10*3/uL (ref 0.00–0.07)
Basophils Absolute: 0 10*3/uL (ref 0.0–0.1)
Basophils Relative: 1 %
Eosinophils Absolute: 0 10*3/uL (ref 0.0–0.5)
Eosinophils Relative: 0 %
HCT: 28.3 % — ABNORMAL LOW (ref 36.0–46.0)
Hemoglobin: 9.8 g/dL — ABNORMAL LOW (ref 12.0–15.0)
Immature Granulocytes: 1 %
Lymphocytes Relative: 10 %
Lymphs Abs: 0.1 10*3/uL — ABNORMAL LOW (ref 0.7–4.0)
MCH: 29.1 pg (ref 26.0–34.0)
MCHC: 34.6 g/dL (ref 30.0–36.0)
MCV: 84 fL (ref 80.0–100.0)
Monocytes Absolute: 0.1 10*3/uL (ref 0.1–1.0)
Monocytes Relative: 10 %
Neutro Abs: 1.2 10*3/uL — ABNORMAL LOW (ref 1.7–7.7)
Neutrophils Relative %: 78 %
Platelets: 7 10*3/uL — CL (ref 150–400)
RBC: 3.37 MIL/uL — ABNORMAL LOW (ref 3.87–5.11)
RDW: 12.1 % (ref 11.5–15.5)
WBC: 1.5 10*3/uL — ABNORMAL LOW (ref 4.0–10.5)
nRBC: 0 % (ref 0.0–0.2)

## 2021-03-14 LAB — RESP PANEL BY RT-PCR (FLU A&B, COVID) ARPGX2
Influenza A by PCR: NEGATIVE
Influenza B by PCR: NEGATIVE
SARS Coronavirus 2 by RT PCR: NEGATIVE

## 2021-03-14 LAB — BRAIN NATRIURETIC PEPTIDE: B Natriuretic Peptide: 271.7 pg/mL — ABNORMAL HIGH (ref 0.0–100.0)

## 2021-03-14 LAB — TROPONIN I (HIGH SENSITIVITY): Troponin I (High Sensitivity): 21 ng/L — ABNORMAL HIGH (ref <18)

## 2021-03-14 LAB — AMMONIA: Ammonia: 37 umol/L — ABNORMAL HIGH (ref 9–35)

## 2021-03-14 LAB — LACTIC ACID, PLASMA: Lactic Acid, Venous: 1.2 mmol/L (ref 0.5–1.9)

## 2021-03-14 MED ORDER — POTASSIUM CHLORIDE CRYS ER 20 MEQ PO TBCR
40.0000 meq | EXTENDED_RELEASE_TABLET | Freq: Every day | ORAL | Status: DC
  Administered 2021-03-15 – 2021-03-16 (×2): 40 meq via ORAL
  Filled 2021-03-14 (×2): qty 2

## 2021-03-14 MED ORDER — FOLIC ACID 1 MG PO TABS
1.0000 mg | ORAL_TABLET | Freq: Every day | ORAL | Status: DC
  Administered 2021-03-15 – 2021-03-28 (×14): 1 mg via ORAL
  Filled 2021-03-14 (×14): qty 1

## 2021-03-14 MED ORDER — METOPROLOL TARTRATE 5 MG/5ML IV SOLN
5.0000 mg | Freq: Four times a day (QID) | INTRAVENOUS | Status: DC | PRN
  Administered 2021-03-14: 5 mg via INTRAVENOUS
  Filled 2021-03-14: qty 5

## 2021-03-14 MED ORDER — DEXAMETHASONE 4 MG PO TABS
4.0000 mg | ORAL_TABLET | Freq: Two times a day (BID) | ORAL | Status: DC
  Administered 2021-03-15 – 2021-03-28 (×29): 4 mg via ORAL
  Filled 2021-03-14 (×29): qty 1

## 2021-03-14 MED ORDER — ACETAMINOPHEN 325 MG PO TABS
650.0000 mg | ORAL_TABLET | Freq: Four times a day (QID) | ORAL | Status: DC | PRN
  Administered 2021-03-15 – 2021-03-28 (×8): 650 mg via ORAL
  Filled 2021-03-14 (×10): qty 2

## 2021-03-14 MED ORDER — LEVOTHYROXINE SODIUM 75 MCG PO TABS
75.0000 ug | ORAL_TABLET | Freq: Every day | ORAL | Status: DC
  Administered 2021-03-15 – 2021-03-28 (×14): 75 ug via ORAL
  Filled 2021-03-14 (×14): qty 1

## 2021-03-14 MED ORDER — LEVETIRACETAM 500 MG PO TABS
500.0000 mg | ORAL_TABLET | Freq: Two times a day (BID) | ORAL | Status: DC
  Administered 2021-03-15 – 2021-03-16 (×4): 500 mg via ORAL
  Filled 2021-03-14 (×4): qty 1

## 2021-03-14 MED ORDER — SODIUM CHLORIDE 0.9 % IV SOLN
10.0000 mL/h | Freq: Once | INTRAVENOUS | Status: AC
  Administered 2021-03-14: 10 mL/h via INTRAVENOUS

## 2021-03-14 MED ORDER — GADOBUTROL 1 MMOL/ML IV SOLN
4.0000 mL | Freq: Once | INTRAVENOUS | Status: AC | PRN
  Administered 2021-03-14: 4 mL via INTRAVENOUS

## 2021-03-14 MED ORDER — PROCHLORPERAZINE MALEATE 10 MG PO TABS
10.0000 mg | ORAL_TABLET | Freq: Four times a day (QID) | ORAL | Status: DC | PRN
  Filled 2021-03-14: qty 1

## 2021-03-14 MED ORDER — ONDANSETRON HCL 4 MG/2ML IJ SOLN
4.0000 mg | Freq: Four times a day (QID) | INTRAMUSCULAR | Status: DC | PRN
  Administered 2021-03-16: 4 mg via INTRAVENOUS
  Filled 2021-03-14 (×2): qty 2

## 2021-03-14 MED ORDER — HYDROCHLOROTHIAZIDE 25 MG PO TABS
25.0000 mg | ORAL_TABLET | Freq: Every day | ORAL | Status: DC
  Administered 2021-03-15 – 2021-03-21 (×7): 25 mg via ORAL
  Filled 2021-03-14 (×7): qty 1

## 2021-03-14 MED ORDER — ONDANSETRON HCL 4 MG PO TABS
4.0000 mg | ORAL_TABLET | Freq: Four times a day (QID) | ORAL | Status: DC | PRN
  Administered 2021-03-22 – 2021-03-23 (×2): 4 mg via ORAL
  Filled 2021-03-14 (×2): qty 1

## 2021-03-14 MED ORDER — TBO-FILGRASTIM 300 MCG/0.5ML ~~LOC~~ SOSY
300.0000 ug | PREFILLED_SYRINGE | Freq: Once | SUBCUTANEOUS | Status: AC
  Administered 2021-03-14: 300 ug via SUBCUTANEOUS
  Filled 2021-03-14: qty 0.5

## 2021-03-14 MED ORDER — ACETAMINOPHEN 650 MG RE SUPP
650.0000 mg | Freq: Four times a day (QID) | RECTAL | Status: DC | PRN
  Filled 2021-03-14: qty 1

## 2021-03-14 MED ORDER — ATORVASTATIN CALCIUM 10 MG PO TABS
10.0000 mg | ORAL_TABLET | Freq: Every day | ORAL | Status: DC
  Administered 2021-03-15 – 2021-03-28 (×13): 10 mg via ORAL
  Filled 2021-03-14 (×14): qty 1

## 2021-03-14 MED ORDER — SODIUM CHLORIDE 0.9 % IV BOLUS
1000.0000 mL | Freq: Once | INTRAVENOUS | Status: AC
  Administered 2021-03-14: 1000 mL via INTRAVENOUS

## 2021-03-14 NOTE — ED Triage Notes (Signed)
Pt arrived via EMS, from home, blood urine x2 days and generalized weakness. Per family, pt has been more confused recently. Pt aox2 in triage, oriented to person and place, not time/year.

## 2021-03-14 NOTE — ED Notes (Signed)
MD J Hong notified of critical.   Platelet ct of 7

## 2021-03-14 NOTE — Telephone Encounter (Signed)
Received message from one of our schedulers that pt's mother was trying to call and kept getting disconnected.  TCT pt's home and spoke with pt's mother. She said pt was unable to talk on the phone. Mother states that pt has not been out of bed for 2-3 days, she is very weak, not eating or drinking much for that same amount of time.  Her mother states she has blood in her urine and her "bottom" is red and sore. She also states that Loretta Woods was confused yesterday. Advised that Loretta Woods should go to the ED for evaluation as she may be dehydrated, have a UTI.  Discussed with Dr. Lorenso Courier and he is in agreement that pt needs to be evaluated in ED. Pt's mother is unsure if she can get her in the car herself. She might need some help from friends. Advised that it might be best to call EMS for transport to avoid a potentially long wait in the ED. Pt's mother said she would talk to Loretta Woods and let me know what they decide.  Dr. Lorenso Courier aware of the above.

## 2021-03-14 NOTE — H&P (Signed)
History and Physical    Loretta Woods BPZ:[id number] DOB: [birth year] DOA: 03/14/2021  PCP: Leonard Downing, MD  Patient coming from: Home  Chief Complaint: altered mental status  HPI: Loretta Woods is a 65 y.o. female with medical history significant of lung cancer w/ brain mets, HTN, HLD, hypothyroidism, seizures. Presenting with altered mental status. History is provided by family at bedside. They report that the patient completed radiation 2 weeks ago and started chemo about 10 days ago. She has noticed that the patient has become progressively weaker over the last 2 weeks. Initially after radiation, she was functioning fine. However, a few days later, she seemed generally weak. She denies any specific pain. No neurological deficits at the time. She found that she had to you her walker a little more. This accelerated after chemo, to the point that this morning, she was really unable to move or function at all. It was noted that she was more confused and talking out of her head. She has had a poor appetite for the last week. She have been no reports of fevers, chills. She denies any other symptoms. Family became concerned and had her brought to the ED for assistance. They deny any other aggravating or alleviating factors.    ED Course: She was noted to be pancytopenic w/o fever. She one unit of plts were ordered. CTH show known mets w/ possible edema. EDP spoke with neuro who recommended ordering and MRI and admitting to [facility]. [facility] was called for admission.   Review of Systems:  Unable to obtain d/t mentation.  PMHx Past Medical History:  Diagnosis Date   Hypertension    Thyroid disease     PSHx Past Surgical History:  Procedure Laterality Date   CRYOTHERAPY  01/19/2021   Procedure: CRYOTHERAPY;  Surgeon: Garner Nash, DO;  Location: [facility];  Service: Pulmonary;;   HEMOSTASIS CONTROL  01/19/2021   Procedure: HEMOSTASIS CONTROL;  Surgeon: Garner Nash, DO;  Location: [facility];  Service: Pulmonary;;   IR IMAGING GUIDED PORT INSERTION  02/27/2021   VIDEO BRONCHOSCOPY  01/19/2021   Procedure: VIDEO BRONCHOSCOPY WITHOUT FLUORO;  Surgeon: Garner Nash, DO;  Location: [facility];  Service: Pulmonary;;    SocHx  reports that she quit smoking about 10 years ago. Her smoking use included cigarettes. She started smoking about 47 years ago. She has a 55.50 pack-year smoking history. She has never used smokeless tobacco. She reports that she does not drink alcohol and does not use drugs.  No Known Allergies  FamHx Family History  Problem Relation Age of Onset   Bladder Cancer Father     Prior to Admission medications   Medication Sig Start Date End Date Taking? Authorizing Provider  atorvastatin (LIPITOR) 10 MG tablet Take 10 mg by mouth daily. 10/28/20  Yes [provider]  citalopram (CELEXA) 20 MG tablet Take 1 tablet (20 mg total) by mouth daily. 03/05/21  Yes Orson Slick, MD  EUTHYROX 75 MCG tablet Take 75 mcg by mouth daily. 10/28/20  Yes [provider]  folic acid (FOLVITE) 1 MG tablet Take 1 tablet (1 mg total) by mouth daily. 02/12/21  Yes Orson Slick, MD  acetaminophen (TYLENOL) 500 MG tablet Take 500 mg by mouth every 6 (six) hours as needed for moderate pain.    [provider]  dexamethasone (DECADRON) 2 MG tablet Take 2 tablets TID. On 8/15 taper to 2 tabs BID. On 9/15 taper to 2 tabs  daily. On 9/30 taper to 1 tab daily.  On 10/7 taper to one tab QOD. Last dose: 10/15. Take with food. 02/19/21   Eppie Gibson, MD  fluconazole (DIFLUCAN) 100 MG tablet Hold Atorvastatin while on this medication. Take 2 tablets today, then 1 tablet daily x 20 more days. 02/07/21   Eppie Gibson, MD  hydrochlorothiazide (HYDRODIURIL) 50 MG tablet Take 25 mg by mouth daily. 11/30/20   [provider]  levETIRAcetam (KEPPRA) 500 MG tablet Take 1 tablet (500 mg total) by mouth 2 (two) times daily. 01/19/21   Patrecia Pour, MD   lidocaine-prilocaine (EMLA) cream Apply 1 application topically as needed. Patient taking differently: Apply 1 application topically as needed (access port). 02/12/21   Orson Slick, MD  Multiple Vitamins-Minerals (EMERGEN-C IMMUNE) PACK Take 1 packet by mouth daily as needed (immune support).    [provider]  ondansetron (ZOFRAN) 8 MG tablet Take 1 tablet (8 mg total) by mouth every 8 (eight) hours as needed. Patient taking differently: Take 8 mg by mouth every 8 (eight) hours as needed for nausea or vomiting. 02/12/21   Orson Slick, MD  potassium chloride SA (KLOR-CON) 20 MEQ tablet Take 1 tablet (20 mEq total) by mouth daily. 03/05/21   Orson Slick, MD  prochlorperazine (COMPAZINE) 10 MG tablet Take 1 tablet (10 mg total) by mouth every 6 (six) hours as needed for nausea or vomiting. 02/12/21   Orson Slick, MD  temazepam (RESTORIL) 22.5 MG capsule Take 1 capsule (22.5 mg total) by mouth at bedtime as needed for sleep. 02/02/21   Eppie Gibson, MD    Physical Exam: Vitals:   03/14/21 1321 03/14/21 1430 03/14/21 1445  BP: (!) 177/102 (!) 174/110 (!) 178/117  Pulse: (!) 108 100 (!) 101  Resp: 18 (!) 22 (!) 24  Temp: 98.9 F (37.2 C)    TempSrc: Oral    SpO2: 96% 95% 97%    General: 65 y.o. female resting in bed in NAD Eyes: PERRL, normal sclera ENMT: Nares patent w/o discharge, orophaynx clear, dentition normal, ears w/o discharge/lesions/ulcers Neck: Supple, trachea midline Cardiovascular: tachy, +S1, S2, no m/g/r, equal pulses throughout Respiratory: CTABL, no w/r/r, normal WOB GI: BS+, NDNT, no masses noted, no organomegaly noted MSK: No e/c/c Neuro: A&O x 3 (person, place, president), no focal deficits Psyc: Appropriate interaction and affect, calm/cooperative  Labs on Admission: I have personally reviewed following labs and imaging studies  CBC: Recent Labs  Lab 03/14/21 1353  WBC 1.5*  NEUTROABS 1.2*  HGB 9.8*  HCT 28.3*  MCV 84.0  PLT 7*    Basic Metabolic Panel: Recent Labs  Lab 03/14/21 1353  NA 138  K 3.3*  CL 100  CO2 28  GLUCOSE 96  BUN 18  CREATININE <0.30*  CALCIUM 8.4*   GFR: CrCl cannot be calculated (This lab value cannot be used to calculate CrCl because it is not a number: <0.30). Liver Function Tests: Recent Labs  Lab 03/14/21 1353  AST 15  ALT 22  ALKPHOS 71  BILITOT 0.8  PROT 5.9*  ALBUMIN 2.6*   No results for input(s): LIPASE, AMYLASE in the last 168 hours. Recent Labs  Lab 03/14/21 1343  AMMONIA 37*   Coagulation Profile: No results for input(s): INR, PROTIME in the last 168 hours. Cardiac Enzymes: No results for input(s): CKTOTAL, CKMB, CKMBINDEX, TROPONINI in the last 168 hours. BNP (last 3 results) No results for input(s): PROBNP in the last  8760 hours. HbA1C: No results for input(s): HGBA1C in the last 72 hours. CBG: No results for input(s): GLUCAP in the last 168 hours. Lipid Profile: No results for input(s): CHOL, HDL, LDLCALC, TRIG, CHOLHDL, LDLDIRECT in the last 72 hours. Thyroid Function Tests: No results for input(s): TSH, T4TOTAL, FREET4, T3FREE, THYROIDAB in the last 72 hours. Anemia Panel: No results for input(s): VITAMINB12, FOLATE, FERRITIN, TIBC, IRON, RETICCTPCT in the last 72 hours. Urine analysis: No results found for: COLORURINE, APPEARANCEUR, LABSPEC, [place], GLUCOSEU, HGBUR, BILIRUBINUR, KETONESUR, PROTEINUR, UROBILINOGEN, NITRITE, LEUKOCYTESUR  Radiological Exams on Admission: CT Head Wo Contrast  Result Date: 03/14/2021 CLINICAL DATA:  Mental status change, unknown cause. Additional history provided: Confusion and weakness. EXAM: CT HEAD WITHOUT CONTRAST TECHNIQUE: Contiguous axial images were obtained from the base of the skull through the vertex without intravenous contrast. COMPARISON:  Brain MRI 02/06/2021.  Head CT 01/17/2021. FINDINGS: Brain: The patient has multiple known intracranial parenchymal metastases, better appreciated on the prior brain  MRI of 02/06/2021. As before, the largest metastasis is present within the right temporal lobe. No definitively new metastasis is identified. There is local mass effect associated with the dominant right temporal lobe metastasis with partial effacement of the right lateral ventricle. However, there is no midline shift. Redemonstrated scattered foci of chronic encephalomalacia/gliosis within the bilateral cerebral hemispheres, the largest again within the mid left frontal lobe (for instance as seen on series 2, image 21). There are foci of parenchymal calcification within the left frontal and right temporal lobes. Background chronic small-vessel ischemic changes within the cerebral white matter and pons. Small chronic infarcts within the left cerebellum, many of which were better appreciated on the brain MRI of 02/06/2021 (acute at that time). No acute infarct is identified. No extra-axial fluid collection. Vascular: No hyperdense vessel. Atherosclerotic calcifications. Skull: Normal. Negative for fracture or focal lesion. Sinuses/Orbits: Visualized orbits show no acute finding. Trace mucosal thickening within the right ethmoid air cells. Other: Trace fluid within the bilateral mastoid air cells. IMPRESSION: The patient has multiple known intracranial parenchymal metastases, better appreciated on the prior brain MRI of 02/06/2021. As before, the largest metastasis is present within the right temporal lobe. There is local mass effect associated with this dominant metastasis, with partial effacement of the right lateral ventricle. However, there is no midline shift. No definitively new metastasis is identified. However, a contrast-enhanced brain MRI would have greater sensitivity for this indication. Redemonstrated scattered foci of chronic encephalomalacia/gliosis within the bilateral cerebral hemispheres. Stable chronic small-vessel ischemic disease within the cerebral white matter and pons. Small chronic infarcts  within the left cerebellum, many of which were better appreciated on the prior brain MRI of 02/06/2021 (acute at that time). Trace bilateral mastoid effusions. Electronically Signed   By: Kellie Simmering D.O.   On: 03/14/2021 15:56   DG Chest Port 1 View  Result Date: 03/14/2021 CLINICAL DATA:  Altered level of consciousness. Generalized weakness and hematuria. History of lung cancer. EXAM: PORTABLE CHEST 1 VIEW COMPARISON:  01/18/2021 FINDINGS: A right jugular Port-A-Cath has been placed and terminates over the mid SVC. The cardiomediastinal silhouette is unchanged with normal heart size. Aortic atherosclerosis is noted. Persistent retrocardiac density corresponds to the known medial left lower lobe mass. Mild atelectasis or scarring is again noted more laterally in the left lung base. There is background hyperinflation with underlying emphysema. The right lung is clear. No pneumothorax is identified. No acute osseous abnormality is seen. IMPRESSION: Known left lower lobe mass with left basilar atelectasis  or scarring. Electronically Signed   By: Logan Bores M.D.   On: 03/14/2021 14:34    EKG: Independently reviewed. 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 Lorenso Courier 03/05/21     - oncology has been consulted; they will follow, see below     - w/ respect to possible edema, neuro recs MRI brain and transfer to [facility]; orders placed, will defer steroid dose adjustment to them; for now will continue with her 4 mg BID for  now  Pancytopenia     - spoke with onco, they will follow     - plt ordered by EDP     - onco rec'd granix 33mcg x 1; order place     - ANC is 1.2 and she has no fevers, will hold off on abx for now     - no evidence of bleed     - check iron studies and LDH/haptoglobin  HTN     - continue home regimen     - add PRN metoprolol  HLD     - continue home statin  Seizure d/o     - continue home keprra  Hypokalemia     - replace K+, check Mg2+  Goals of Care     - appropriate to consult Palliative  DVT prophylaxis: SCDs  Code Status: FULL  Family Communication: w/ mother at bedside  Consults called: Onco (Dr. Burr Medico); EDP spoke with neuro (Dr. Quinn Axe)   Status is: Inpatient  Remains inpatient appropriate because:Inpatient level of care appropriate due to severity of illness  Dispo: The patient is from: Home              Anticipated d/c is to:  TBD              Patient currently is not medically stable to d/c.   Difficult to place patient No  Time spent coordinating admission: 70 minutes  [facility]  If 7PM-7AM, please contact night-coverage [email or web address]  03/14/2021, 4:47 PM

## 2021-03-14 NOTE — ED Notes (Signed)
Patient transported to MRI 

## 2021-03-14 NOTE — ED Notes (Signed)
Patient incontinent of urine.  Patient cleaned with soap and water.  New adult brief placed.  PureWick replaced.  Warm blankets provided to patient.

## 2021-03-15 ENCOUNTER — Inpatient Hospital Stay (HOSPITAL_COMMUNITY): Payer: 59

## 2021-03-15 ENCOUNTER — Encounter (HOSPITAL_COMMUNITY): Payer: Self-pay | Admitting: Internal Medicine

## 2021-03-15 DIAGNOSIS — G9341 Metabolic encephalopathy: Secondary | ICD-10-CM | POA: Diagnosis not present

## 2021-03-15 DIAGNOSIS — R64 Cachexia: Secondary | ICD-10-CM

## 2021-03-15 DIAGNOSIS — Z7189 Other specified counseling: Secondary | ICD-10-CM | POA: Diagnosis not present

## 2021-03-15 DIAGNOSIS — I10 Essential (primary) hypertension: Secondary | ICD-10-CM

## 2021-03-15 DIAGNOSIS — R7881 Bacteremia: Secondary | ICD-10-CM | POA: Diagnosis not present

## 2021-03-15 DIAGNOSIS — R531 Weakness: Secondary | ICD-10-CM

## 2021-03-15 DIAGNOSIS — Z66 Do not resuscitate: Secondary | ICD-10-CM

## 2021-03-15 DIAGNOSIS — Z515 Encounter for palliative care: Secondary | ICD-10-CM

## 2021-03-15 LAB — BLOOD CULTURE ID PANEL (REFLEXED) - BCID2
A.calcoaceticus-baumannii: NOT DETECTED
Bacteroides fragilis: NOT DETECTED
CTX-M ESBL: NOT DETECTED
Candida albicans: NOT DETECTED
Candida auris: NOT DETECTED
Candida glabrata: NOT DETECTED
Candida krusei: NOT DETECTED
Candida parapsilosis: NOT DETECTED
Candida tropicalis: NOT DETECTED
Carbapenem resist OXA 48 LIKE: NOT DETECTED
Carbapenem resistance IMP: NOT DETECTED
Carbapenem resistance KPC: NOT DETECTED
Carbapenem resistance NDM: NOT DETECTED
Carbapenem resistance VIM: NOT DETECTED
Cryptococcus neoformans/gattii: NOT DETECTED
Enterobacter cloacae complex: NOT DETECTED
Enterobacterales: DETECTED — AB
Enterococcus Faecium: NOT DETECTED
Enterococcus faecalis: NOT DETECTED
Escherichia coli: DETECTED — AB
Haemophilus influenzae: NOT DETECTED
Klebsiella aerogenes: NOT DETECTED
Klebsiella oxytoca: NOT DETECTED
Klebsiella pneumoniae: NOT DETECTED
Listeria monocytogenes: NOT DETECTED
Neisseria meningitidis: NOT DETECTED
Proteus species: DETECTED — AB
Pseudomonas aeruginosa: NOT DETECTED
Salmonella species: NOT DETECTED
Serratia marcescens: NOT DETECTED
Staphylococcus aureus (BCID): NOT DETECTED
Staphylococcus epidermidis: NOT DETECTED
Staphylococcus lugdunensis: NOT DETECTED
Staphylococcus species: NOT DETECTED
Stenotrophomonas maltophilia: NOT DETECTED
Streptococcus agalactiae: NOT DETECTED
Streptococcus pneumoniae: NOT DETECTED
Streptococcus pyogenes: NOT DETECTED
Streptococcus species: NOT DETECTED

## 2021-03-15 LAB — COMPREHENSIVE METABOLIC PANEL
ALT: 23 U/L (ref 0–44)
AST: 16 U/L (ref 15–41)
Albumin: 2.2 g/dL — ABNORMAL LOW (ref 3.5–5.0)
Alkaline Phosphatase: 70 U/L (ref 38–126)
Anion gap: 11 (ref 5–15)
BUN: 16 mg/dL (ref 8–23)
CO2: 26 mmol/L (ref 22–32)
Calcium: 7.7 mg/dL — ABNORMAL LOW (ref 8.9–10.3)
Chloride: 95 mmol/L — ABNORMAL LOW (ref 98–111)
Creatinine, Ser: 0.49 mg/dL (ref 0.44–1.00)
GFR, Estimated: >60 mL/min (ref >60)
Glucose, Bld: 81 mg/dL (ref 70–99)
Potassium: 2.8 mmol/L — ABNORMAL LOW (ref 3.5–5.1)
Sodium: 132 mmol/L — ABNORMAL LOW (ref 135–145)
Total Bilirubin: 1.3 mg/dL — ABNORMAL HIGH (ref 0.3–1.2)
Total Protein: 5.3 g/dL — ABNORMAL LOW (ref 6.5–8.1)

## 2021-03-15 LAB — IRON AND TIBC
Iron: 59 ug/dL (ref 28–170)
Saturation Ratios: 32 % — ABNORMAL HIGH (ref 10.4–31.8)
TIBC: 186 ug/dL — ABNORMAL LOW (ref 250–450)
UIBC: 127 ug/dL

## 2021-03-15 LAB — CBC
HCT: 27.5 % — ABNORMAL LOW (ref 36.0–46.0)
Hemoglobin: 9.7 g/dL — ABNORMAL LOW (ref 12.0–15.0)
MCH: 29 pg (ref 26.0–34.0)
MCHC: 35.3 g/dL (ref 30.0–36.0)
MCV: 82.3 fL (ref 80.0–100.0)
Platelets: 6 10*3/uL — CL (ref 150–400)
RBC: 3.34 MIL/uL — ABNORMAL LOW (ref 3.87–5.11)
RDW: 12 % (ref 11.5–15.5)
WBC: 2.8 10*3/uL — ABNORMAL LOW (ref 4.0–10.5)
nRBC: 0 % (ref 0.0–0.2)

## 2021-03-15 LAB — MAGNESIUM: Magnesium: 1.7 mg/dL (ref 1.7–2.4)

## 2021-03-15 LAB — TROPONIN I (HIGH SENSITIVITY): Troponin I (High Sensitivity): 32 ng/L — ABNORMAL HIGH (ref <18)

## 2021-03-15 LAB — AMMONIA: Ammonia: 27 umol/L (ref 9–35)

## 2021-03-15 LAB — ABO/RH
ABO/RH(D): A POS
ABO/RH(D): A POS

## 2021-03-15 LAB — VITAMIN B12: Vitamin B-12: 1392 pg/mL — ABNORMAL HIGH (ref 180–914)

## 2021-03-15 LAB — LACTATE DEHYDROGENASE: LDH: 268 U/L — ABNORMAL HIGH (ref 98–192)

## 2021-03-15 LAB — TSH: TSH: 7.008 u[IU]/mL — ABNORMAL HIGH (ref 0.350–4.500)

## 2021-03-15 LAB — LACTIC ACID, PLASMA: Lactic Acid, Venous: 1.2 mmol/L (ref 0.5–1.9)

## 2021-03-15 LAB — FOLATE: Folate: 23.1 ng/mL (ref >5.9)

## 2021-03-15 MED ORDER — POTASSIUM CHLORIDE CRYS ER 20 MEQ PO TBCR
40.0000 meq | EXTENDED_RELEASE_TABLET | Freq: Once | ORAL | Status: AC
  Administered 2021-03-15: 40 meq via ORAL
  Filled 2021-03-15: qty 2

## 2021-03-15 MED ORDER — THIAMINE HCL 100 MG/ML IJ SOLN
100.0000 mg | Freq: Every day | INTRAMUSCULAR | Status: DC
  Administered 2021-03-20: 100 mg via INTRAVENOUS
  Filled 2021-03-15 (×4): qty 2

## 2021-03-15 MED ORDER — THIAMINE HCL 100 MG PO TABS
100.0000 mg | ORAL_TABLET | Freq: Every day | ORAL | Status: DC
  Administered 2021-03-15 – 2021-03-28 (×13): 100 mg via ORAL
  Filled 2021-03-15 (×14): qty 1

## 2021-03-15 MED ORDER — SODIUM CHLORIDE 0.9 % IV SOLN
2.0000 g | INTRAVENOUS | Status: AC
  Administered 2021-03-15 – 2021-03-24 (×10): 2 g via INTRAVENOUS
  Filled 2021-03-15 (×10): qty 20

## 2021-03-15 MED ORDER — SODIUM CHLORIDE 0.9% IV SOLUTION: Freq: Once | INTRAVENOUS | Status: AC

## 2021-03-15 MED ORDER — ENSURE ENLIVE PO LIQD
237.0000 mL | Freq: Two times a day (BID) | ORAL | Status: DC
  Administered 2021-03-16 – 2021-03-26 (×14): 237 mL via ORAL

## 2021-03-15 MED ORDER — SODIUM CHLORIDE 0.9 % IV SOLN: INTRAVENOUS | Status: AC

## 2021-03-15 MED ORDER — ADULT MULTIVITAMIN W/MINERALS CH
1.0000 | ORAL_TABLET | Freq: Every day | ORAL | Status: DC
  Administered 2021-03-15 – 2021-03-28 (×14): 1 via ORAL
  Filled 2021-03-15 (×15): qty 1

## 2021-03-15 MED ORDER — SODIUM CHLORIDE 0.9% FLUSH
10.0000 mL | Freq: Two times a day (BID) | INTRAVENOUS | Status: DC
  Administered 2021-03-15 – 2021-03-17 (×5): 10 mL
  Administered 2021-03-18: 20 mL
  Administered 2021-03-18 – 2021-03-28 (×20): 10 mL

## 2021-03-15 MED ORDER — SODIUM CHLORIDE 0.9% FLUSH: 10.0000 mL | INTRAVENOUS | Status: DC | PRN

## 2021-03-15 MED ORDER — GERHARDT'S BUTT CREAM
TOPICAL_CREAM | Freq: Four times a day (QID) | CUTANEOUS | Status: DC
  Administered 2021-03-27: 1 via TOPICAL
  Filled 2021-03-15 (×3): qty 1

## 2021-03-15 MED ORDER — CHLORHEXIDINE GLUCONATE CLOTH 2 % EX PADS
6.0000 | MEDICATED_PAD | Freq: Every day | CUTANEOUS | Status: DC
  Administered 2021-03-15 – 2021-03-28 (×13): 6 via TOPICAL

## 2021-03-15 NOTE — Consult Note (Signed)
[facility]    Date of Admission:  03/14/2021   Total days of antibiotics 1        Ceftriaxone day 1       Reason for Consult: E. coli and Proteus bacteremia    Referring Provider: Dr. Cruzita Lederer   Assessment: Patient with history of primary lung cancer and brain mets who was admitted for generalized weakness and acute encephalopathy.  Brain MRI shows stable brain tumor.  Blood culture now grew E. coli and Proteus.  Pending susceptibility.  Patient states afebrile but severe pancytopenic.  She is also severely malnourished.  Patient has no symptoms that suggest respiratory, GI and urinary source.  She has a implanted port for chemotherapy which can be a source of infection.  However given this is a gram-negative bacteremia, do not recommend removing the port.  We will treat her gram-negative bacteremia with IV ceftriaxone at this time.  Anticipated duration 10-14 days.  Plan: Continue IV ceftriaxone.  Anticipated 10-14 days of treatment course. Follow susceptibility  Active Problems:   Acute metabolic encephalopathy   Scheduled Meds:  atorvastatin  10 mg Oral Daily   Chlorhexidine Gluconate Cloth  6 each Topical Daily   dexamethasone  4 mg Oral BID   folic acid  1 mg Oral Daily   hydrochlorothiazide  25 mg Oral Daily   levETIRAcetam  500 mg Oral BID   levothyroxine  75 mcg Oral Q0600   potassium chloride  40 mEq Oral Daily   sodium chloride flush  10-40 mL Intracatheter Q12H   thiamine injection  100 mg Intravenous Daily   Or   thiamine  100 mg Oral Daily   Continuous Infusions:  cefTRIAXone (ROCEPHIN)  IV 2 g (03/15/21 0843)   PRN Meds:.acetaminophen **OR** acetaminophen, metoprolol tartrate, ondansetron **OR** ondansetron (ZOFRAN) IV, prochlorperazine, sodium chloride flush  HPI: Loretta Woods is a 65 y.o. female with past medical history of primary lung cancer and brain metastasis, HTN, HLD, hypothyroidism, seizures who was hospitalized  for generalized weakness and decrease in mental status.  She was initially admitted to [facility] long hospital and transferred to [facility] for neurology consult.  MRI of the brain shows stable cancer with surrounding edema and local mass-effect.  Blood culture grew E. coli and Proteus.  Patient is seen today at bedside with her mother.  She reported feeling well except for generalized fatigue.  Still mildly confused.  Denies any other complaints.  Denies chest pain, shortness of breath, abdominal pain, nausea, vomiting, diarrhea, suprapubic pain, dysuria.  Endorses mild cough.  Also endorses poor appetite.  Her mother states that patient's mental status has improved slightly.   Review of Systems: ROS Per HPI  Past Medical History:  Diagnosis Date   Hypertension    Thyroid disease     Social History   Tobacco Use   Smoking status: Former    Packs/day: 1.50    Years: 37.00    Pack years: 55.50    Types: Cigarettes    Start date: 07/15/1973    Quit date: 2012    Years since quitting: 10.6   Smokeless tobacco: Never  Vaping Use   Vaping Use: Every day  Substance Use Topics   Alcohol use: Never   Drug use: Never    Family History  Problem Relation Age of Onset   Bladder Cancer Father    No Known Allergies  OBJECTIVE: Blood pressure (!) 146/93, pulse 98, temperature (!)  97.1 F (36.2 C), temperature source Axillary, resp. rate 18, SpO2 96 %.  Physical Exam Constitutional:      General: She is not in acute distress.    Appearance: She is not toxic-appearing.     Comments: Appears fatigued.  Cachectic  HENT:     Head: Atraumatic.     Comments: Swelling of left face with facial droop.  Phonation normal.  Dry lips Eyes:     Conjunctiva/sclera: Conjunctivae normal.  Cardiovascular:     Rate and Rhythm: Normal rate and regular rhythm.  Pulmonary:     Effort: Pulmonary effort is normal. No respiratory distress.     Breath sounds: Normal breath sounds. No wheezing.   Abdominal:     General: Bowel sounds are normal. There is no distension.     Palpations: Abdomen is soft.     Tenderness: There is no abdominal tenderness.  Musculoskeletal:     Comments: Implanted port seen on the right upper chest.  The overlying skin appear healthy and noninfected.  Neurological:     Mental Status: She is alert.     Comments: Patient is alert and oriented to person.  She can remember her birthday and her mother's name.  Cannot tell her location and year.  Psychiatric:        Mood and Affect: Mood normal.        Behavior: Behavior normal.    Lab Results Lab Results  Component Value Date   WBC 2.8 (L) 03/15/2021   HGB 9.7 (L) 03/15/2021   HCT 27.5 (L) 03/15/2021   MCV 82.3 03/15/2021   PLT 6 (LL) 03/15/2021    Lab Results  Component Value Date   CREATININE 0.49 03/15/2021   BUN 16 03/15/2021   NA 132 (L) 03/15/2021   K 2.8 (L) 03/15/2021   CL 95 (L) 03/15/2021   CO2 26 03/15/2021    Lab Results  Component Value Date   ALT 23 03/15/2021   AST 16 03/15/2021   ALKPHOS 70 03/15/2021   BILITOT 1.3 (H) 03/15/2021     Microbiology: Recent Results (from the past 240 hour(s))  Culture, blood (routine x 2)     Status: None (Preliminary result)   Collection Time: 03/14/21  1:43 PM   Specimen: Left Antecubital; Blood  Result Value Ref Range Status   Specimen Description   Final    LEFT ANTECUBITAL Performed at [facility], [place] [address], [place], [place] [address]    Special Requests   Final    BOTTLES DRAWN AEROBIC AND ANAEROBIC Blood Culture adequate volume Performed at [place] [address] [place], [place] [address]    Culture  Setup Time   Final    GRAM NEGATIVE RODS IN BOTH AEROBIC AND ANAEROBIC BOTTLES    Culture   Final    NO GROWTH < 24 HOURS Performed at [facility], [place] [address] [place], [place] [address]    Report Status PENDING  Incomplete  Culture, blood (routine x 2)      Status: None (Preliminary result)   Collection Time: 03/14/21  1:53 PM   Specimen: Right Antecubital; Blood  Result Value Ref Range Status   Specimen Description   Final    RIGHT ANTECUBITAL Performed at [facility] [place] [address], [place], [place] [address]    Special Requests   Final    BOTTLES DRAWN AEROBIC AND ANAEROBIC Blood Culture adequate volume Performed at [facility],  [place] [address] [place], [place] [address]    Culture  Setup Time   Final    GRAM NEGATIVE RODS IN BOTH AEROBIC AND ANAEROBIC BOTTLES CRITICAL RESULT CALLED TO, READ BACK BY AND VERIFIED WITH: V BRYK,PHARMD@[id number]  03/15/21 [place]    Culture   Final    NO GROWTH < 24 HOURS Performed at [facility], [place] [address], [place], [place] [address]    Report Status PENDING  Incomplete  Blood Culture ID Panel (Reflexed)     Status: Abnormal   Collection Time: 03/14/21  1:53 PM  Result Value Ref Range Status   Enterococcus faecalis NOT DETECTED NOT DETECTED Final   Enterococcus Faecium NOT DETECTED NOT DETECTED Final   Listeria monocytogenes NOT DETECTED NOT DETECTED Final   Staphylococcus species NOT DETECTED NOT DETECTED Final   Staphylococcus aureus (BCID) NOT DETECTED NOT DETECTED Final   Staphylococcus epidermidis NOT DETECTED NOT DETECTED Final   Staphylococcus lugdunensis NOT DETECTED NOT DETECTED Final   Streptococcus species NOT DETECTED NOT DETECTED Final   Streptococcus agalactiae NOT DETECTED NOT DETECTED Final   Streptococcus pneumoniae NOT DETECTED NOT DETECTED Final   Streptococcus pyogenes NOT DETECTED NOT DETECTED Final   A.calcoaceticus-baumannii NOT DETECTED NOT DETECTED Final   Bacteroides fragilis NOT DETECTED NOT DETECTED Final   Enterobacterales DETECTED (A) NOT DETECTED Final    Comment: CRITICAL RESULT CALLED TO, READ BACK BY AND VERIFIED WITH: V BRYK,PHARMD@0723  03/15/21 [place]    Enterobacter cloacae complex NOT DETECTED NOT DETECTED Final    Escherichia coli DETECTED (A) NOT DETECTED Final    Comment: CRITICAL RESULT CALLED TO, READ BACK BY AND VERIFIED WITH: V BRYK,PHARMD@0723  03/15/21 [place]    Klebsiella aerogenes NOT DETECTED NOT DETECTED Final   Klebsiella oxytoca NOT DETECTED NOT DETECTED Final   Klebsiella pneumoniae NOT DETECTED NOT DETECTED Final   Proteus species DETECTED (A) NOT DETECTED Final    Comment: CRITICAL RESULT CALLED TO, READ BACK BY AND VERIFIED WITH: V BRYK,PHARMD@0723  03/15/21 [place]    Salmonella species NOT DETECTED NOT DETECTED Final   Serratia marcescens NOT DETECTED NOT DETECTED Final   Haemophilus influenzae NOT DETECTED NOT DETECTED Final   Neisseria meningitidis NOT DETECTED NOT DETECTED Final   Pseudomonas aeruginosa NOT DETECTED NOT DETECTED Final   Stenotrophomonas maltophilia NOT DETECTED NOT DETECTED Final   Candida albicans NOT DETECTED NOT DETECTED Final   Candida auris NOT DETECTED NOT DETECTED Final   Candida glabrata NOT DETECTED NOT DETECTED Final   Candida krusei NOT DETECTED NOT DETECTED Final   Candida parapsilosis NOT DETECTED NOT DETECTED Final   Candida tropicalis NOT DETECTED NOT DETECTED Final   Cryptococcus neoformans/gattii NOT DETECTED NOT DETECTED Final   CTX-M ESBL NOT DETECTED NOT DETECTED Final   Carbapenem resistance IMP NOT DETECTED NOT DETECTED Final   Carbapenem resistance KPC NOT DETECTED NOT DETECTED Final   Carbapenem resistance NDM NOT DETECTED NOT DETECTED Final   Carbapenem resist OXA 48 LIKE NOT DETECTED NOT DETECTED Final   Carbapenem resistance VIM NOT DETECTED NOT DETECTED Final    Comment: Performed at [facility], [address], [place], [place] [address]  Resp Panel by RT-PCR (Flu A&B, Covid) Nasopharyngeal Swab     Status: None   Collection Time: 03/14/21  1:54 PM   Specimen: Nasopharyngeal Swab; Nasopharyngeal(NP) swabs in vial transport medium  Result Value Ref Range Status   SARS Coronavirus 2 by RT PCR NEGATIVE NEGATIVE Final    Comment:  (NOTE) SARS-CoV-2 target nucleic acids  are NOT DETECTED.  The SARS-CoV-2 RNA is generally detectable in upper respiratory specimens during the acute phase of infection. The lowest concentration of SARS-CoV-2 viral copies this assay can detect is 138 copies/mL. A negative result does not preclude SARS-Cov-2 infection and should not be used as the sole basis for treatment or other patient management decisions. A negative result may occur with  improper specimen collection/handling, submission of specimen other than nasopharyngeal swab, presence of viral mutation(s) within the areas targeted by this assay, and inadequate number of viral copies(<138 copies/mL). A negative result must be combined with clinical observations, patient history, and epidemiological information. The expected result is Negative.  Fact Sheet for Patients:  EntrepreneurPulse.com.au  Fact Sheet for Healthcare Providers:  IncredibleEmployment.be  This test is no t yet approved or cleared by the [place] FDA and  has been authorized for detection and/or diagnosis of SARS-CoV-2 by FDA under an Emergency Use Authorization (EUA). This EUA will remain  in effect (meaning this test can be used) for the duration of the COVID-19 declaration under Section 564(b)(1) of the Act, 21 U.S.C.section 360bbb-3(b)(1), unless the authorization is terminated  or revoked sooner.       Influenza A by PCR NEGATIVE NEGATIVE Final   Influenza B by PCR NEGATIVE NEGATIVE Final    Comment: (NOTE) The Xpert Xpress SARS-CoV-2/FLU/RSV plus assay is intended as an aid in the diagnosis of influenza from Nasopharyngeal swab specimens and should not be used as a sole basis for treatment. Nasal washings and aspirates are unacceptable for Xpert Xpress SARS-CoV-2/FLU/RSV testing.  Fact Sheet for Patients: EntrepreneurPulse.com.au  Fact Sheet for Healthcare  Providers: IncredibleEmployment.be  This test is not yet approved or cleared by the [place] FDA and has been authorized for detection and/or diagnosis of SARS-CoV-2 by FDA under an Emergency Use Authorization (EUA). This EUA will remain in effect (meaning this test can be used) for the duration of the COVID-19 declaration under Section 564(b)(1) of the Act, 21 U.S.C. section 360bbb-3(b)(1), unless the authorization is terminated or revoked.  Performed at [facility], [place] [address], [place], [place] [address]     Gaylan Gerold, [facility] [phone] pager    03/15/2021, 11:40 AM

## 2021-03-15 NOTE — Consult Note (Addendum)
[facility] Nurse Consult Note: Reason for Consult: Consult requested for bilat buttocks and perineum.  Pt has multiple systemic factors which can impair healing related to cancer diagnosis, and she is frequently incontinent of liquid stool and urine prior to admission, according to her mother at the bedside who cares for the patient.  She wears a diaper at home and sits in a chair for most of the day. She currently has a Purewick in place to attempt to contain the urine.  Inner anterior perineum is red moist and macerated with scattered red partial thickness wounds; appearance is consistent with moisture associated skin damage and probable candidiasis. Posterior bilat buttocks are 50% red moist and macerated with scattered red full thickness wounds; appearance is consistent with moisture associated skin damage and probable candidiasis.. The affected bilateral area is approx 8X8X.2cm, with 50% patchy areas of Unstageable pressure injuries with yellow slough in the center of each side. It would be difficult to keep a dressing from becoming soiled related to the incontinence, so I will order cream and antifungal powder. Pressure Injury POA: Yes Dressing procedure/placement/frequency: Topical treatment orders provided for bedside nurses to perform to absorb drainage, decrease discomfort, and attempt to promote healing: Apply Gerhardts cream to bilat buttocks and perineum QID and PRN when turning or cleaning and then apply antifungal powder over the top. Please re-consult if further assistance is needed.  Thank-you,  Julien Girt MSN, [place], [place], [place], [place]

## 2021-03-15 NOTE — Progress Notes (Signed)
PROGRESS NOTE  Loretta Woods ZOX:[id number] DOB: Apr 22, 1956 DOA: 03/14/2021 PCP: Leonard Downing, MD   LOS: 1 day   Brief Narrative / Interim history: 65 year old female with history of stage IV lung cancer with brain metastasis, HTN, HLD, hypothyroidism, seizures came into the hospital with altered mental status.  Patient apparently completed brain radiation couple of weeks ago and has been placed on chemotherapy 10 days ago.  Over the last couple of weeks she is becoming progressively weaker.  She was also occasionally confused and has been having a poor appetite in the last week.  Subjective / 24h Interval events: She seems somewhat slow to respond but is able to open her eyes and can converse at a basic level.  Seems mildly confused  Assessment & Plan: Principal Problem Acute metabolic encephalopathy-likely multifactorial, possibly related to the brain mets.  An MRI of the brain on admission showed multiple metastatic deposits stable since 02/06/2021, the largest lesion in the right temporal lobe has a significant dural component likely due to a dural metastasis while inpatient with surrounding edema and mass-effect. -For now continue Decadron to minimize swelling, continue Keppra, supportive care -Neurology consulted as well  Active Problems Gram-negative bacteremia-blood cultures this morning were positive for gram-negative rods, BC ID showing E. coli as well as Proteus species -Continue to monitor final speciation, continue ceftriaxone -Urinalysis pending, she has mild elevation in her bilirubin and will do a right upper quadrant ultrasound -Sat 20th of 102, cancer center, eventually did not go to the ER  Stage IV adenocarcinoma of the lung-status post whole brain radiation but beginning of August and started chemotherapy with Dr. Lorenso Courier 8/22.  Appreciate input  Pancytopenia, chemotherapy-induced-transfused 1 unit of platelets.  Received Granix yesterday.  Continue to monitor CBC  with differential  Hypothyroidism-TSH 7.0, no need to adjust her current Synthroid dose, not high enough to explain her lethargy and may be in the setting of acute illness.  Monitor as an outpatient and repeat TSH in 3 to 4 weeks  Seizure disorder-continue Keppra  Hypokalemia-due to poor p.o. intake, continue to replete  Hypertension-continue home regimen  Hyperlipidemia -continue statin  Goals of Care - appropriate to consult Palliative  Scheduled Meds:  atorvastatin  10 mg Oral Daily   Chlorhexidine Gluconate Cloth  6 each Topical Daily   dexamethasone  4 mg Oral BID   folic acid  1 mg Oral Daily   hydrochlorothiazide  25 mg Oral Daily   levETIRAcetam  500 mg Oral BID   levothyroxine  75 mcg Oral Q0600   potassium chloride  40 mEq Oral Daily   thiamine injection  100 mg Intravenous Daily   Or   thiamine  100 mg Oral Daily   Continuous Infusions:  cefTRIAXone (ROCEPHIN)  IV 2 g (03/15/21 0843)   PRN Meds:.acetaminophen **OR** acetaminophen, metoprolol tartrate, ondansetron **OR** ondansetron (ZOFRAN) IV, prochlorperazine  Diet Orders (From admission, onward)     Start     Ordered   03/14/21 2239  Diet Heart Room service appropriate? Yes; Fluid consistency: Thin  Diet effective now       Question Answer Comment  Room service appropriate? Yes   Fluid consistency: Thin      03/14/21 2239            DVT prophylaxis: SCDs Start: 03/14/21 2239     Code Status: Full Code  Family Communication: Sister Lora Havens [phone]  Status is: Inpatient  Remains inpatient appropriate because:Inpatient level of care appropriate due to severity  of illness  Dispo: The patient is from: Home              Anticipated d/c is to: SNF              Patient currently is not medically stable to d/c.   Difficult to place patient No  Level of care: Progressive  Consultants:  Oncology Neurology  Procedures:  None   Microbiology  Blood cultures with E. coli and Proteus,  final sensitivities pending  Antimicrobials: Ceftriaxone 9/1 >>   Objective: Vitals:   03/15/21 0237 03/15/21 0400 03/15/21 0538 03/15/21 0803  BP:  (!) 150/92 (!) 136/93 (!) 148/93  Pulse:  94 91 97  Resp:  (!) 23 17 14   Temp: 98.8 F (37.1 C)  99.3 F (37.4 C) 98.7 F (37.1 C)  TempSrc: Oral  Oral Oral  SpO2: 93% 94% 98% 95%    Intake/Output Summary (Last 24 hours) at 03/15/2021 1011 Last data filed at 03/15/2021 8850 Gross per 24 hour  Intake 1043.52 ml  Output --  Net 1043.52 ml   There were no vitals filed for this visit.  Examination:  Constitutional: NAD Eyes: no scleral icterus ENMT: Mucous membranes are moist.  Neck: normal, supple Respiratory: c lungs are overall clear, shallow breathing, no wheezing Cardiovascular: Regular rate and rhythm, no murmurs / rubs / gallops. No LE edema. Good peripheral pulses Abdomen: non distended, no tenderness. Bowel sounds positive.  Musculoskeletal: no clubbing / cyanosis.  Skin: no rashes Neurologic: Generalized weakness present but no focal deficits   Data Reviewed: I have independently reviewed following labs and imaging studies   CBC: Recent Labs  Lab 03/14/21 1353 03/15/21 0555  WBC 1.5* 2.8*  NEUTROABS 1.2*  --   HGB 9.8* 9.7*  HCT 28.3* 27.5*  MCV 84.0 82.3  PLT 7* 6*   Basic Metabolic Panel: Recent Labs  Lab 03/14/21 1353 03/15/21 0023 03/15/21 0555  NA 138  --  132*  K 3.3*  --  2.8*  CL 100  --  95*  CO2 28  --  26  GLUCOSE 96  --  81  BUN 18  --  16  CREATININE <0.30*  --  0.49  CALCIUM 8.4*  --  7.7*  MG  --  1.7  --    Liver Function Tests: Recent Labs  Lab 03/14/21 1353 03/15/21 0555  AST 15 16  ALT 22 23  ALKPHOS 71 70  BILITOT 0.8 1.3*  PROT 5.9* 5.3*  ALBUMIN 2.6* 2.2*   Coagulation Profile: No results for input(s): INR, PROTIME in the last 168 hours. HbA1C: No results for input(s): HGBA1C in the last 72 hours. CBG: No results for input(s): GLUCAP in the last 168  hours.  Recent Results (from the past 240 hour(s))  Culture, blood (routine x 2)     Status: None (Preliminary result)   Collection Time: 03/14/21  1:43 PM   Specimen: Left Antecubital; Blood  Result Value Ref Range Status   Specimen Description   Final    LEFT ANTECUBITAL Performed at [facility] [address] [place], [place] [address]    Special Requests   Final    BOTTLES DRAWN AEROBIC AND ANAEROBIC Blood Culture adequate volume Performed at [facility] [address] [place], [place] [address]    Culture  Setup Time   Final    GRAM NEGATIVE RODS IN BOTH AEROBIC AND ANAEROBIC BOTTLES    Culture   Final  NO GROWTH < 24 HOURS Performed at [facility] [address], [place], [place] [address]    Report Status PENDING  Incomplete  Culture, blood (routine x 2)     Status: None (Preliminary result)   Collection Time: 03/14/21  1:53 PM   Specimen: Right Antecubital; Blood  Result Value Ref Range Status   Specimen Description   Final    RIGHT ANTECUBITAL Performed at [facility] [address] [place], [place] [address]    Special Requests   Final    BOTTLES DRAWN AEROBIC AND ANAEROBIC Blood Culture adequate volume Performed at [facility] [address], [place], [place] [address]    Culture  Setup Time   Final    GRAM NEGATIVE RODS IN BOTH AEROBIC AND ANAEROBIC BOTTLES CRITICAL RESULT CALLED TO, READ BACK BY AND VERIFIED WITH: V BRYK,PHARMD@0723  03/15/21 [place]    Culture   Final    NO GROWTH < 24 HOURS Performed at [facility] [address] [place], [place] [address]    Report Status PENDING  Incomplete  Blood Culture ID Panel (Reflexed)     Status: Abnormal   Collection Time: 03/14/21  1:53 PM  Result Value Ref Range Status   Enterococcus faecalis NOT DETECTED NOT DETECTED Final   Enterococcus Faecium NOT DETECTED NOT DETECTED Final   Listeria monocytogenes NOT  DETECTED NOT DETECTED Final   Staphylococcus species NOT DETECTED NOT DETECTED Final   Staphylococcus aureus (BCID) NOT DETECTED NOT DETECTED Final   Staphylococcus epidermidis NOT DETECTED NOT DETECTED Final   Staphylococcus lugdunensis NOT DETECTED NOT DETECTED Final   Streptococcus species NOT DETECTED NOT DETECTED Final   Streptococcus agalactiae NOT DETECTED NOT DETECTED Final   Streptococcus pneumoniae NOT DETECTED NOT DETECTED Final   Streptococcus pyogenes NOT DETECTED NOT DETECTED Final   A.calcoaceticus-baumannii NOT DETECTED NOT DETECTED Final   Bacteroides fragilis NOT DETECTED NOT DETECTED Final   Enterobacterales DETECTED (A) NOT DETECTED Final    Comment: CRITICAL RESULT CALLED TO, READ BACK BY AND VERIFIED WITH: V BRYK,PHARMD@0723  03/15/21 [place]    Enterobacter cloacae complex NOT DETECTED NOT DETECTED Final   Escherichia coli DETECTED (A) NOT DETECTED Final    Comment: CRITICAL RESULT CALLED TO, READ BACK BY AND VERIFIED WITH: V BRYK,PHARMD@0723  03/15/21 [place]    Klebsiella aerogenes NOT DETECTED NOT DETECTED Final   Klebsiella oxytoca NOT DETECTED NOT DETECTED Final   Klebsiella pneumoniae NOT DETECTED NOT DETECTED Final   Proteus species DETECTED (A) NOT DETECTED Final    Comment: CRITICAL RESULT CALLED TO, READ BACK BY AND VERIFIED WITH: V BRYK,PHARMD@0723  03/15/21 [place]    Salmonella species NOT DETECTED NOT DETECTED Final   Serratia marcescens NOT DETECTED NOT DETECTED Final   Haemophilus influenzae NOT DETECTED NOT DETECTED Final   Neisseria meningitidis NOT DETECTED NOT DETECTED Final   Pseudomonas aeruginosa NOT DETECTED NOT DETECTED Final   Stenotrophomonas maltophilia NOT DETECTED NOT DETECTED Final   Candida albicans NOT DETECTED NOT DETECTED Final   Candida auris NOT DETECTED NOT DETECTED Final   Candida glabrata NOT DETECTED NOT DETECTED Final   Candida krusei NOT DETECTED NOT DETECTED Final   Candida parapsilosis NOT DETECTED NOT DETECTED Final   Candida  tropicalis NOT DETECTED NOT DETECTED Final   Cryptococcus neoformans/gattii NOT DETECTED NOT DETECTED Final   CTX-M ESBL NOT DETECTED NOT DETECTED Final   Carbapenem resistance IMP NOT DETECTED NOT DETECTED Final   Carbapenem resistance KPC NOT DETECTED NOT DETECTED  Final   Carbapenem resistance NDM NOT DETECTED NOT DETECTED Final   Carbapenem resist OXA 48 LIKE NOT DETECTED NOT DETECTED Final   Carbapenem resistance VIM NOT DETECTED NOT DETECTED Final    Comment: Performed at [facility], [place] [address] [place], [place] [address]  Resp Panel by RT-PCR (Flu A&B, Covid) Nasopharyngeal Swab     Status: None   Collection Time: 03/14/21  1:54 PM   Specimen: Nasopharyngeal Swab; Nasopharyngeal(NP) swabs in vial transport medium  Result Value Ref Range Status   SARS Coronavirus 2 by RT PCR NEGATIVE NEGATIVE Final    Comment: (NOTE) SARS-CoV-2 target nucleic acids are NOT DETECTED.  The SARS-CoV-2 RNA is generally detectable in upper respiratory specimens during the acute phase of infection. The lowest concentration of SARS-CoV-2 viral copies this assay can detect is 138 copies/mL. A negative result does not preclude SARS-Cov-2 infection and should not be used as the sole basis for treatment or other patient management decisions. A negative result may occur with  improper specimen collection/handling, submission of specimen other than nasopharyngeal swab, presence of viral mutation(s) within the areas targeted by this assay, and inadequate number of viral copies(<138 copies/mL). A negative result must be combined with clinical observations, patient history, and epidemiological information. The expected result is Negative.  Fact Sheet for Patients:  EntrepreneurPulse.com.au  Fact Sheet for Healthcare Providers:  IncredibleEmployment.be  This test is no t yet approved or cleared by the [place] FDA and  has been authorized for detection  and/or diagnosis of SARS-CoV-2 by FDA under an Emergency Use Authorization (EUA). This EUA will remain  in effect (meaning this test can be used) for the duration of the COVID-19 declaration under Section 564(b)(1) of the Act, 21 U.S.C.section 360bbb-3(b)(1), unless the authorization is terminated  or revoked sooner.       Influenza A by PCR NEGATIVE NEGATIVE Final   Influenza B by PCR NEGATIVE NEGATIVE Final    Comment: (NOTE) The Xpert Xpress SARS-CoV-2/FLU/RSV plus assay is intended as an aid in the diagnosis of influenza from Nasopharyngeal swab specimens and should not be used as a sole basis for treatment. Nasal washings and aspirates are unacceptable for Xpert Xpress SARS-CoV-2/FLU/RSV testing.  Fact Sheet for Patients: EntrepreneurPulse.com.au  Fact Sheet for Healthcare Providers: IncredibleEmployment.be  This test is not yet approved or cleared by the [place] FDA and has been authorized for detection and/or diagnosis of SARS-CoV-2 by FDA under an Emergency Use Authorization (EUA). This EUA will remain in effect (meaning this test can be used) for the duration of the COVID-19 declaration under Section 564(b)(1) of the Act, 21 U.S.C. section 360bbb-3(b)(1), unless the authorization is terminated or revoked.  Performed at [facility], [place] [address] [place], [place] [address]      Radiology Studies: CT Head Wo Contrast  Result Date: 03/14/2021 CLINICAL DATA:  Mental status change, unknown cause. Additional history provided: Confusion and weakness. EXAM: CT HEAD WITHOUT CONTRAST TECHNIQUE: Contiguous axial images were obtained from the base of the skull through the vertex without intravenous contrast. COMPARISON:  Brain MRI 02/06/2021.  Head CT 01/17/2021. FINDINGS: Brain: The patient has multiple known intracranial parenchymal metastases, better appreciated on the prior brain MRI of 02/06/2021. As before, the  largest metastasis is present within the right temporal lobe. No definitively new metastasis is identified. There is local mass effect associated with the dominant right temporal lobe metastasis with partial effacement of the right lateral ventricle. However, there is no midline  shift. Redemonstrated scattered foci of chronic encephalomalacia/gliosis within the bilateral cerebral hemispheres, the largest again within the mid left frontal lobe (for instance as seen on series 2, image 21). There are foci of parenchymal calcification within the left frontal and right temporal lobes. Background chronic small-vessel ischemic changes within the cerebral white matter and pons. Small chronic infarcts within the left cerebellum, many of which were better appreciated on the brain MRI of 02/06/2021 (acute at that time). No acute infarct is identified. No extra-axial fluid collection. Vascular: No hyperdense vessel. Atherosclerotic calcifications. Skull: Normal. Negative for fracture or focal lesion. Sinuses/Orbits: Visualized orbits show no acute finding. Trace mucosal thickening within the right ethmoid air cells. Other: Trace fluid within the bilateral mastoid air cells. IMPRESSION: The patient has multiple known intracranial parenchymal metastases, better appreciated on the prior brain MRI of 02/06/2021. As before, the largest metastasis is present within the right temporal lobe. There is local mass effect associated with this dominant metastasis, with partial effacement of the right lateral ventricle. However, there is no midline shift. No definitively new metastasis is identified. However, a contrast-enhanced brain MRI would have greater sensitivity for this indication. Redemonstrated scattered foci of chronic encephalomalacia/gliosis within the bilateral cerebral hemispheres. Stable chronic small-vessel ischemic disease within the cerebral white matter and pons. Small chronic infarcts within the left cerebellum, many of  which were better appreciated on the prior brain MRI of 02/06/2021 (acute at that time). Trace bilateral mastoid effusions. Electronically Signed   By: Kellie Simmering D.O.   On: 03/14/2021 15:56   MR Brain W and Wo Contrast  Result Date: 03/14/2021 CLINICAL DATA:  History of metastatic lung cancer with brain metastasis. Altered mental status. EXAM: MRI HEAD WITHOUT AND WITH CONTRAST TECHNIQUE: Multiplanar, multiecho pulse sequences of the brain and surrounding structures were obtained without and with intravenous contrast. CONTRAST:  17mL GADAVIST GADOBUTROL 1 MMOL/ML IV SOLN COMPARISON:  MRI head with contrast 02/06/2021 FINDINGS: Brain: Multiple metastatic deposits in the brain are identified on the prior study. The current study is degraded by motion particularly on the postcontrast images. Multiple metastatic deposits are present, the largest in the right temporal lobe. Multiple metastatic lesions continues show restricted diffusion. There is dural-based enhancement in the floor of the middle cranial fossa on the right with adjacent large cystic metastasis in the right temporal lobe. This lesion measures up to 5.0 x 2.8 cm, similar in size to the prior study. There is mild associated hemorrhage and moderate surrounding edema. Adjacent satellite lesion in the right superior temporal lobe measures 17 mm in diameter, unchanged. Enhancing lesion in the left occipital lobe 1 cm unchanged. Subcentimeter lesion left medial temporal lobe unchanged. Multiple additional enhancing lesions seen on the prior study which is of better quality. Ventricle size normal. 3 mm midline shift to the left unchanged. Chronic infarct left frontal convexity unchanged. Chronic microvascular ischemic change in the pons. Chronic infarct left cerebellum. No acute infarct identified. Vascular: Normal arterial flow voids Skull and upper cervical spine: No skeletal metastasis. Sinuses/Orbits: Negative Other: None IMPRESSION: Multiple metastatic  deposits in the brain which appear stable since 02/06/2021. Largest lesion in the right temporal lobe has a significant dural component likely due to dural metastasis or dural invasion. There is surrounding edema and local mass-effect. No significant progression of metastatic disease and no acute infarct identified. Image quality degraded by motion. Electronically Signed   By: Franchot Gallo M.D.   On: 03/14/2021 18:29   DG Chest [facility]  Result Date: 03/14/2021 CLINICAL DATA:  Altered level of consciousness. Generalized weakness and hematuria. History of lung cancer. EXAM: PORTABLE CHEST 1 VIEW COMPARISON:  01/18/2021 FINDINGS: A right jugular Port-A-Cath has been placed and terminates over the mid SVC. The cardiomediastinal silhouette is unchanged with normal heart size. Aortic atherosclerosis is noted. Persistent retrocardiac density corresponds to the known medial left lower lobe mass. Mild atelectasis or scarring is again noted more laterally in the left lung base. There is background hyperinflation with underlying emphysema. The right lung is clear. No pneumothorax is identified. No acute osseous abnormality is seen. IMPRESSION: Known left lower lobe mass with left basilar atelectasis or scarring. Electronically Signed   By: Logan Bores M.D.   On: 03/14/2021 14:34    Marzetta Board, MD, PhD [facility]  Between 7 am - 7 pm I am available, please contact me via Amion (for emergencies) or Securechat (non urgent messages)  Between 7 pm - 7 am I am not available, please contact night coverage MD/APP via Amion

## 2021-03-15 NOTE — Progress Notes (Signed)
VAST consulted to access port. Upon arrival at bedside, tech assisting patient with eating breakfast. Spoke with patient's nurse. Patient currently has PIV which is infusing antibiotics. Pt also needs platelets. VAST RN will return to access port.

## 2021-03-15 NOTE — Progress Notes (Signed)
PHARMACY - PHYSICIAN COMMUNICATION CRITICAL VALUE ALERT - BLOOD CULTURE IDENTIFICATION (BCID)  Loretta Woods is an 65 y.o. female who presented to [facility] on 03/14/2021 with a chief complaint of AMS and hematuria.  Assessment:  Admitted for acute metabolic encephalopathy, pancytopenic and afebrile on admission, BCID reveals E.coli and Proteus spp growing in 4 of 4 bottles, Tmax 99.3 without any APAP charted.  Name of physician (or Provider) Contacted: CGherghe MD  Current antibiotics: None  Changes to prescribed antibiotics recommended:  Recommendations accepted by provider -- start Rocephin 2g IV Q24H.  Results for orders placed or performed during the hospital encounter of 03/14/21  Blood Culture ID Panel (Reflexed) (Collected: 03/14/2021  1:53 PM)  Result Value Ref Range   Enterococcus faecalis NOT DETECTED NOT DETECTED   Enterococcus Faecium NOT DETECTED NOT DETECTED   Listeria monocytogenes NOT DETECTED NOT DETECTED   Staphylococcus species NOT DETECTED NOT DETECTED   Staphylococcus aureus (BCID) NOT DETECTED NOT DETECTED   Staphylococcus epidermidis NOT DETECTED NOT DETECTED   Staphylococcus lugdunensis NOT DETECTED NOT DETECTED   Streptococcus species NOT DETECTED NOT DETECTED   Streptococcus agalactiae NOT DETECTED NOT DETECTED   Streptococcus pneumoniae NOT DETECTED NOT DETECTED   Streptococcus pyogenes NOT DETECTED NOT DETECTED   A.calcoaceticus-baumannii NOT DETECTED NOT DETECTED   Bacteroides fragilis NOT DETECTED NOT DETECTED   Enterobacterales DETECTED (A) NOT DETECTED   Enterobacter cloacae complex NOT DETECTED NOT DETECTED   Escherichia coli DETECTED (A) NOT DETECTED   Klebsiella aerogenes NOT DETECTED NOT DETECTED   Klebsiella oxytoca NOT DETECTED NOT DETECTED   Klebsiella pneumoniae NOT DETECTED NOT DETECTED   Proteus species DETECTED (A) NOT DETECTED   Salmonella species NOT DETECTED NOT DETECTED   Serratia marcescens NOT DETECTED NOT DETECTED   Haemophilus  influenzae NOT DETECTED NOT DETECTED   Neisseria meningitidis NOT DETECTED NOT DETECTED   Pseudomonas aeruginosa NOT DETECTED NOT DETECTED   Stenotrophomonas maltophilia NOT DETECTED NOT DETECTED   Candida albicans NOT DETECTED NOT DETECTED   Candida auris NOT DETECTED NOT DETECTED   Candida glabrata NOT DETECTED NOT DETECTED   Candida krusei NOT DETECTED NOT DETECTED   Candida parapsilosis NOT DETECTED NOT DETECTED   Candida tropicalis NOT DETECTED NOT DETECTED   Cryptococcus neoformans/gattii NOT DETECTED NOT DETECTED   CTX-M ESBL NOT DETECTED NOT DETECTED   Carbapenem resistance IMP NOT DETECTED NOT DETECTED   Carbapenem resistance KPC NOT DETECTED NOT DETECTED   Carbapenem resistance NDM NOT DETECTED NOT DETECTED   Carbapenem resist OXA 48 LIKE NOT DETECTED NOT DETECTED   Carbapenem resistance VIM NOT DETECTED NOT DETECTED    Wynona Neat, PharmD, BCPS  03/15/2021  7:41 AM

## 2021-03-15 NOTE — ED Notes (Signed)
Attempted reports.  No answer from floor.  Will attempt again shortly

## 2021-03-15 NOTE — ED Notes (Signed)
Patient has removed PureWick.  Found incontinent of urine.  Patient cleaned with soap and water.  Additional barrier cream applied to buttocks.  Patient repositioned in bed for comfort.  No c/o voiced at this time

## 2021-03-15 NOTE — Consult Note (Signed)
Palliative Care Consult Note                                  Date: 03/15/2021   Patient Name: Loretta Woods  DOB: 12/28/1955  MRN: [id number]  Age / Sex: 65 y.o., female  PCP: Leonard Downing, MD Referring Physician: Caren Griffins, MD  Reason for Consultation: Establishing goals of care  HPI/Patient Profile: Palliative Care consult requested for goals of care discussion in this 65 y.o. female  with past medical history of  hypertension, stage IV lung cancer with brain metastasis, hypothyroidism, HLD, and seizures. She was admitted on 03/14/2021 from home with altered mental status. She is s/p whole brain radiation and cycle 1 of chemo 8/22 (Carbo/Pem/Pem) with Dr. Lorenso Courier.  Blood cultures are positive for gram-negative rods. ID is following.   Past Medical History:  Diagnosis Date  . Hypertension   . Thyroid disease      Subjective:   This NP Osborne Oman reviewed medical records, received report from team, assessed the patient and then met at the patient's bedside with patient, her mother Loretta Woods, and I spoke with patient's sister via phone Loretta Woods) to discuss diagnosis, prognosis, GOC, EOL wishes disposition and options.  Malai is somnolent but easily aroused. Denies pain or shortness of breath. She is alert to self, place, and her mother. Mother states similar behaviors at home prior to admission.    Concept of Palliative Care was introduced as specialized medical care for people and their families living with serious illness.  It focuses on providing relief from the symptoms and stress of a serious illness.  The goal is to improve quality of life for both the patient and the family. Values and goals of care important to patient and family were attempted to be elicited.  I created space and opportunity for patient and family to explore state of health prior to admission, thoughts, and feelings. Patient is able to state she lives in  the home with her mother. They have lived together for several years. 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   Patient's mother states patient has become weaker over the past 3-4 weeks. She now requires moderate assistance with all ADLs. Appetite has been poor.   We discussed Her current illness and what it means in the larger context of Her on-going co-morbidities. Natural disease trajectory and expectations were discussed.  Patient's sister and mother verbalized understanding of current illness and co-morbidities. Mrs. Loretta Woods states she is concerned about patient and her significant decline since starting chemotherapy.   Loretta Woods shares concerns with patient returning home and the level of care she may need. She is interested in rehab if recommended or home health once patient is discharge.   Family is realistic in their understanding of patient's decline although they are remaining somewhat hopeful for stability.   I discussed the importance of continued conversation with family and their medical providers regarding overall plan of care and treatment options, ensuring decisions are within the context of the patients values and GOCs.  Questions and concerns were addressed. The family was encouraged to call with questions or concerns.  PMT will continue to support holistically as needed.  Life Review: Lives in the home with her elderly mother. No kids. Divorced. Worked for many years as a Educational psychologist.    Objective:   Primary Diagnoses: Present on Admission: . Acute metabolic encephalopathy  Scheduled Meds: . atorvastatin  10 mg Oral Daily  . Chlorhexidine Gluconate Cloth  6 each Topical Daily  . dexamethasone  4 mg Oral BID  . folic acid  1 mg Oral Daily  . hydrochlorothiazide  25 mg Oral Daily  . levETIRAcetam  500 mg Oral BID  . levothyroxine  75 mcg Oral Q0600  . potassium chloride  40 mEq Oral Daily  . sodium chloride flush  10-40 mL Intracatheter Q12H  . thiamine  injection  100 mg Intravenous Daily   Or  . thiamine  100 mg Oral Daily    Continuous Infusions: . cefTRIAXone (ROCEPHIN)  IV 2 g (03/15/21 0843)    PRN Meds: acetaminophen **OR** acetaminophen, metoprolol tartrate, ondansetron **OR** ondansetron (ZOFRAN) IV, prochlorperazine, sodium chloride flush  No Known Allergies  Review of Systems  Unable to perform ROS: Mental status change  Unless otherwise noted, a complete review of systems is negative.  Physical Exam General: NAD, cachectic, chronically-ill appearing Cardiovascular: regular rate and rhythm Pulmonary: clear ant fields Abdomen: soft, nontender, thin, + bowel sounds Extremities: no edema, no joint deformities Skin: no rashes, warm and dry Neurological: somnolent but easily aroused, alert to person, place, and family   Vital Signs:  BP (!) 146/93   Pulse 98   Temp (!) 97.1 F (36.2 C) (Axillary)   Resp 18   SpO2 96%  Pain Scale: 0-10   Pain Score: 0-No pain  SpO2: SpO2: 96 % O2 Device:SpO2: 96 % O2 Flow Rate: .   IO: Intake/output summary:  Intake/Output Summary (Last 24 hours) at 03/15/2021 1256 Last data filed at 03/15/2021 0900 Gross per 24 hour  Intake 1103.52 ml  Output --  Net 1103.52 ml    LBM: Last BM Date: 03/14/21 Baseline Weight:   Most recent weight:        Palliative Assessment/Data: PPS 20%   Advanced Care Planning:   Primary Decision Maker: NEXT OF KIN  Code Status/Advance Care Planning: DNR  A discussion was had today regarding advanced directives. Concepts specific to code status, artifical feeding and hydration, continued IV antibiotics and rehospitalization was had.   I discussed at length patient's full code status with consideration of her current illness and co-morbidities. Patient states she would not want to be of life-supporting machines. Patient's mother and sister expresses their mutual agreement. Education provided on DNR/DNI. Family confirms wishes.   Patient has  not completed advanced directives although she and family had been in discussion several weeks ago. [name] and her mother states her sister, Loretta Woods is her primary medical decision maker.   Family is clear in expressed goals to continue to treat the treatable. They are remaining somewhat hopeful for stability but is also aware of such a drastic decline over the past several weeks. They are concerned with patient's care needs at discharge and the ability for family to meet those needs without some assistance. Goal is to continue close follow up with their Oncology team for advisement on future treatments etc.    Assessment & Plan:   SUMMARY OF RECOMMENDATIONS   DNR/DNI-as requested and confirmed by patient, mother, and sister Continue with current plan of care, treat the treatable Family is clear in expressed goals as they are remaining hopeful for some stability but is also prepared for the worst with realistic understanding she has declined over the past several weeks. They wish to continue to allow Izora Gala every opportunity to thrive but would not wish to see her suffer. Goals is  for continued Oncological treatments and interventions if recommended and patient able to sustain.  PMT will continue to support and follow. Please call team line with urgent needs.  Symptom Management:  Per Attending  Palliative Prophylaxis:  Aspiration, Bowel Regimen, Delirium Protocol, Frequent Pain Assessment, Oral Care, and Turn Reposition  Additional Recommendations (Limitations, Scope, Preferences): Treat the treatable, DNR/DNI  Psycho-social/Spiritual:  Desire for further Chaplaincy support: no Additional Recommendations: Education on Hospice and ongoing goals of care discussion   Prognosis:  Guarded-Poor   Discharge Planning:  To Be Determined  with recommendations for outpatient palliative support at minimum.   Discussed with: Juliann Pulse, RN.   Patient's family expressed understanding and was in  agreement with this plan.   Time In: 1405 Time Out: 1500 Time Total: 55 min.   Visit consisted of counseling and education dealing with the complex and emotionally intense issues of symptom management and palliative care in the setting of serious and potentially life-threatening illness.Greater than 50%  of this time was spent counseling and coordinating care related to the above assessment and plan.  Signed by:  Alda Lea, AGPCNP-BC Palliative Medicine Team  Phone: [phone] Pager: [phone] Amion: Bjorn Pippin   Thank you for allowing the Palliative Medicine Team to assist in the care of this patient. Please utilize secure chat with additional questions, if there is no response within 30 minutes please call the above phone number. Palliative Medicine Team providers are available by phone from 7am to 5pm daily and can be reached through the team cell phone.  Should this patient require assistance outside of these hours, please call the patient's attending physician.

## 2021-03-15 NOTE — Progress Notes (Signed)
PT Cancellation Note  Patient Details Name: Loretta Woods MRN: [id number] DOB: [birth year]   Cancelled Treatment:    Reason Eval/Treat Not Completed: Medical issues which prohibited therapy Pt with low platelets and currently awaiting infusion. Will follow up as pt appropriate and as schedule allows.   Lou Miner, DPT  Acute Rehabilitation Services  Pager: [phone] Office: [phone]    Rudean Hitt 03/15/2021, 9:21 AM

## 2021-03-15 NOTE — Consult Note (Addendum)
NEUROLOGY CONSULTATION NOTE   Date of service: March 15, 2021 Patient Name: Ladene Allocca MRN:  [id number] DOB:  02-20-1956 Reason for consult: "Worsening confusion" Requesting Provider: Jonnie Finner, DO _ _ _   _ __   _ __ _ _  __ __   _ __   __ _  History of Present Illness  Tiann Saha is a 65 y.o. 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 Completed radiation 2 weeks ago and started chemotherapy about 10 days ago. Family noted her to have poor appetite over the last week, talking out of her head and progressive weakness and now unable to walk. She was brought her to the ED for further evaluation.  MRI Brain in the ED with stable mets and edema with mass effect. No significant progression since MRI on 7/26. Rest of the workup in the ED with pancytopenia and a platelet count of just 7.  She was transferred to [facility] for further evaluation and potential cEEG.  I was unable to get in touch with her family so most of the history obtained from patient. Denies having any seizures over the last couple weeks. Has been compliant with her medications including her seizures meds. Describes feeling tired, nauseous and poor appetite and tries her best to eat. Feeling somewhat better here. No fever, no chills, no sweating, denies any URI like symptoms, no UTI like symptoms. No headache, no neck pain.   ROS   Constitutional Lost a lot ofweight over the last last few months, no fever and chills.   HEENT Denies changes in vision and hearing.   Respiratory Denies SOB and cough.   CV Denies palpitations and CP   GI Denies abdominal pain, nausea, vomiting and diarrhea.   GU Denies dysuria and urinary frequency.   MSK Denies myalgia and joint pain.   Skin Denies rash and pruritus.   Neurological Denies headache and syncope.   Psychiatric Denies recent  changes in mood. Denies anxiety and depression.    Past History   Past Medical History:  Diagnosis Date  . Hypertension   . Thyroid disease    Past Surgical History:  Procedure Laterality Date  . CRYOTHERAPY  01/19/2021   Procedure: CRYOTHERAPY;  Surgeon: Garner Nash, DO;  Location: [facility];  Service: Pulmonary;;  . HEMOSTASIS CONTROL  01/19/2021   Procedure: HEMOSTASIS CONTROL;  Surgeon: Garner Nash, DO;  Location: [facility];  Service: Pulmonary;;  . IR IMAGING GUIDED PORT INSERTION  02/27/2021  . VIDEO BRONCHOSCOPY  01/19/2021   Procedure: VIDEO BRONCHOSCOPY WITHOUT FLUORO;  Surgeon: Garner Nash, DO;  Location: [facility];  Service: Pulmonary;;   Family History  Problem Relation Age of Onset  . Bladder Cancer Father    Social History   Socioeconomic History  . Marital status: Divorced    Spouse name: Not on file  . Number of children: 0  . Years of education: Not on file  . Highest education level: Not on file  Occupational History  . Not on file  Tobacco Use  . Smoking status: Former    Packs/day: 1.50    Years: 37.00    Pack years: 55.50    Types: Cigarettes    Start date: 07/15/1973    Quit date: 2012    Years since quitting: 10.6  . Smokeless tobacco: Never  Vaping Use  .  Vaping Use: Every day  Substance and Sexual Activity  . Alcohol use: Never  . Drug use: Never  . Sexual activity: Not on file  Other Topics Concern  . Not on file  Social History Narrative  . Not on file   Social Determinants of Health   Financial Resource Strain: Low Risk   . Difficulty of Paying Living Expenses: Not hard at all  Food Insecurity: No Food Insecurity  . Worried About Charity fundraiser in the Last Year: Never true  . Ran Out of Food in the Last Year: Never true  Transportation Needs: No Transportation Needs  . Lack of Transportation (Medical): No  . Lack of Transportation (Non-Medical): No  Physical Activity: Not on file  Stress: Not on file   Social Connections: Not on file   No Known Allergies  Medications   Medications Prior to Admission  Medication Sig Dispense Refill Last Dose  . acetaminophen (TYLENOL) 500 MG tablet Take 500 mg by mouth every 6 (six) hours as needed for moderate pain.   unknown  . atorvastatin (LIPITOR) 10 MG tablet Take 10 mg by mouth daily.   03/13/2021  . citalopram (CELEXA) 20 MG tablet Take 1 tablet (20 mg total) by mouth daily. 30 tablet 5 03/13/2021  . dexamethasone (DECADRON) 2 MG tablet Take 2 tablets TID. On 8/15 taper to 2 tabs BID. On 9/15 taper to 2 tabs daily. On 9/30 taper to 1 tab daily.  On 10/7 taper to one tab QOD. Last dose: 10/15. Take with food. (Patient taking differently: Take 4 mg by mouth 2 (two) times daily. Take 2 tablets TID. On 8/15 taper to 2 tabs BID. On 9/15 taper to 2 tabs daily. On 9/30 taper to 1 tab daily.  On 10/7 taper to one tab QOD. Last dose: 10/15. Take with food.) 60 tablet 2 03/13/2021  . EUTHYROX 75 MCG tablet Take 75 mcg by mouth daily.   03/14/2021  . folic acid (FOLVITE) 1 MG tablet Take 1 tablet (1 mg total) by mouth daily. 90 tablet 3 03/13/2021  . hydrochlorothiazide (HYDRODIURIL) 50 MG tablet Take 25 mg by mouth daily.   03/13/2021  . levETIRAcetam (KEPPRA) 500 MG tablet Take 1 tablet (500 mg total) by mouth 2 (two) times daily. (Patient taking differently: Take 500 mg by mouth 2 (two) times daily as needed (seizures).) 60 tablet 0 03/13/2021  . lidocaine-prilocaine (EMLA) cream Apply 1 application topically as needed. (Patient taking differently: Apply 1 application topically as needed (access port).) 30 g 0 unknown  . Multiple Vitamins-Minerals (EMERGEN-C IMMUNE) PACK Take 1 packet by mouth daily as needed (immune support).   unknown  . ondansetron (ZOFRAN) 8 MG tablet Take 1 tablet (8 mg total) by mouth every 8 (eight) hours as needed. (Patient taking differently: Take 8 mg by mouth every 8 (eight) hours as needed for nausea or vomiting.) 30 tablet 0 unknown  .  potassium chloride SA (KLOR-CON) 20 MEQ tablet Take 1 tablet (20 mEq total) by mouth daily. 21 tablet 3 03/13/2021  . prochlorperazine (COMPAZINE) 10 MG tablet Take 1 tablet (10 mg total) by mouth every 6 (six) hours as needed for nausea or vomiting. 30 tablet 0   . fluconazole (DIFLUCAN) 100 MG tablet Hold Atorvastatin while on this medication. Take 2 tablets today, then 1 tablet daily x 20 more days. (Patient not taking: No sig reported) 22 tablet 0 Completed Course  . temazepam (RESTORIL) 22.5 MG capsule Take 1 capsule (22.5 mg total)  by mouth at bedtime as needed for sleep. (Patient not taking: No sig reported) 30 capsule 0 Completed Course     Vitals   Vitals:   03/15/21 0230 03/15/21 0237 03/15/21 0400 03/15/21 0538  BP: (!) 158/95  (!) 150/92 (!) 136/93  Pulse: 98  94 91  Resp: (!) 25  (!) 23 17  Temp:  98.8 F (37.1 C)  99.3 F (37.4 C)  TempSrc:  Oral  Oral  SpO2:  93% 94% 98%     There is no height or weight on file to calculate BMI.  Physical Exam   General: Laying comfortably in bed; in no acute distress.  HENT: Normal oropharynx and mucosa. Normal external appearance of ears and nose.  Neck: Supple, no pain or tenderness  CV: No JVD. No peripheral edema.  Pulmonary: Symmetric Chest rise. Normal respiratory effort.  Abdomen: Soft to touch, non-tender.  Ext: No cyanosis, edema, or deformity  Skin: No rash. Normal palpation of skin.   Musculoskeletal: Normal digits and nails by inspection. No clubbing.   Neurologic Examination  Mental status/Cognition: somnolent, oriented to self, place, month but not to year, poor attention and keeps falling asleep during my evaluation. Speech/language: Fluent, comprehension intact, object naming intact, repetition intact.  Cranial nerves:   CN II Pupils equal and reactive to light, no VF deficits    CN III,IV,VI EOM intact, no gaze preference or deviation, no nystagmus. Mild L ptosis which isnot new per patient.   CN V normal  sensation in V1, V2, and V3 segments bilaterally    CN VII Flattening of the nasolabial fold on the right   CN VIII normal hearing to speech    CN IX & X normal palatal elevation, no uvular deviation    CN XI 5/5 head turn and 5/5 shoulder shrug bilaterally   CN XII midline tongue protrusion   Motor:  Muscle bulk: poor, tone normal, pronator drift none tremor none Mvmt Root Nerve  Muscle Right Left Comments  SA C5/6 Ax Deltoid 4+ 4+   EF C5/6 Mc Biceps 4+ 4+   EE C6/7/8 Rad Triceps 4+ 4+   WF C6/7 Med FCR     WE C7/8 PIN ECU     F Ab C8/T1 U ADM/FDI 4+ 4+   HF L1/2/3 Fem Illopsoas 4 4   KE L2/3/4 Fem Quad 4+ 4+   DF L4/5 D Peron Tib Ant 4+ 4+   PF S1/2 Tibial Grc/Sol 4+ 4+    Reflexes:  Right Left Comments  Pectoralis      Biceps (C5/6) 1 1   Brachioradialis (C5/6) 1 1    Triceps (C6/7) 1 1    Patellar (L3/4) 1 1    Achilles (S1)      Hoffman      Plantar     Jaw jerk    Sensation:  Light touch intact   Pin prick    Temperature    Vibration   Proprioception    Coordination/Complex Motor:  - Finger to Nose intact - Heel to shin unable to do. - Rapid alternating movement are slowed - Gait: unsafe to assess given her weakness.  Labs   CBC:  Recent Labs  Lab 03/14/21 1353  WBC 1.5*  NEUTROABS 1.2*  HGB 9.8*  HCT 28.3*  MCV 84.0  PLT 7*    Basic Metabolic Panel:  Lab Results  Component Value Date   NA 138 03/14/2021   K 3.3 (L) 03/14/2021   CO2 28  03/14/2021   GLUCOSE 96 03/14/2021   BUN 18 03/14/2021   CREATININE <0.30 (L) 03/14/2021   CALCIUM 8.4 (L) 03/14/2021   GFRNONAA NOT CALCULATED 03/14/2021   Lipid Panel: No results found for: LDLCALC HgbA1c: No results found for: HGBA1C Urine Drug Screen: No results found for: LABOPIA, COCAINSCRNUR, LABBENZ, AMPHETMU, THCU, LABBARB  Alcohol Level No results found for: [facility]  MRI Brain(personally reviewed): Multiple metastatic deposits in the brain which appear stable since 02/06/2021. Largest lesion in  the right temporal lobe has a significant dural component likely due to dural metastasis or dural invasion. There is surrounding edema and local mass-effect.   No significant progression of metastatic disease and no acute infarct identified.  rEEG:  pending  Impression   Christabell Loseke is a 65 y.o. 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 Limited history as I was not able to get in touch with family. On my evaluation, she is slow, somnolent and weak all over. She has a R facial droop and left ptosis and on my discussion with patient, this is not new. I suspect that her gradual decline and lethargy is likely deconditioning in the setting of chemotherapy and pancytopenia.  This is something that I thing would be expected in the setting of chemotherapy, specially since it happened in the setting of recent chemotherapy and radiation. Potential vitamin deficiency could contribute to this too or simply that she is dehydrated. No obvious meningismus on exam, no headache, no fever.  It does not seem like she is having any episodes concerning for seizures. None I see documented during this hospitalization.  Recommendations  - Encephalopathy labs with Vitamin B12, folate, TSH, Ammonia, Thiamine. - Multivitamin PO daily.  ______________________________________________________________________   Thank you for the opportunity to take part in the care of this patient. If you have any further questions, please contact the neurology consultation attending.  Signed,  [place] Pager Number [phone] _ _ _   _ __   _ __ _ _  __ __   _ __   __ _  Addendum 03/15/21 at 0802 by Su Monks MD  Critical value alert for blood culture identification noted in chart. BCID reveals E. Coli and Proteus growing in 4 of 4 blood culture  bottles. This explains her encephalopathy, defer further mgmt to primary team. Her MRI brain wwo appears stable from scan last month without any new discrete lesions to suggest intracranial abscess. She has had no recent clinical events concerning for seizure. Neurology to sign off, but please re-engage if any new neurologic concerns arise.  Su Monks, MD [facility] [phone]  If 7pm- 7am, please page neurology on call as listed in [place].

## 2021-03-15 NOTE — Progress Notes (Signed)
Initial Nutrition Assessment  DOCUMENTATION CODES:   Not applicable  INTERVENTION:  -Recommend diet liberalization given poor po intake  -Ensure Enlive po BID, each supplement provides 350 kcal and 20 grams of protein -MVI with minerals daily  NUTRITION DIAGNOSIS:   Inadequate oral intake related to cancer and cancer related treatments as evidenced by per patient/family report, meal completion < 25%.  GOAL:   Patient will meet greater than or equal to 90% of their needs  MONITOR:   PO intake, Supplement acceptance, Labs, Weight trends, I & O's, Skin  REASON FOR ASSESSMENT:   Malnutrition Screening Tool    ASSESSMENT:   Pt with PMH significant for stage IV lung Ca with brain metastasis, HTN, HLD, hypothyroidism, seizures admitted with bacteremia and acute metabolic encephalopathy-likely multifactorial, possibly related to the brain mets.  Pt unavailable at time of RD visit. Per H&P, pt completed brain radiation a couple of weeks ago and was placed on chemotherapy 10 days ago. Over the last couple of weeks, pt reports becoming progressively weaker with occasional confusion and poor appetite x1 week.   Reviewed weight history. Per weight readings, pt weighed 44 kg on 01/19/21 and 39.8 kg on 03/08/21. This indicates a clinically significant 9.5% weight loss in <2 months. Suspect pt meets criteria for malnutrition; however, unable to diagnose at this time without detailed diet history and/or nutrition-focused physical exam.   PO Intake: 15% x1 recorded meal   Medications: decadron, folvite, klor-con, thiamine, IV abx Labs: Recent Labs  Lab 03/14/21 1353 03/15/21 0023 03/15/21 0555  NA 138  --  132*  K 3.3*  --  2.8*  CL 100  --  95*  CO2 28  --  26  BUN 18  --  16  CREATININE <0.30*  --  0.49  CALCIUM 8.4*  --  7.7*  MG  --  1.7  --   GLUCOSE 96  --  81   No UOP documented x24 hours  NUTRITION - FOCUSED PHYSICAL EXAM: Unable to complete at this time. Will attempt at  follow-up.  Diet Order:   Diet Order             Diet Heart Room service appropriate? Yes; Fluid consistency: Thin  Diet effective now                   EDUCATION NEEDS:   No education needs have been identified at this time  Skin:  Skin Assessment: Skin Integrity Issues: Skin Integrity Issues:: Unstageable, Other (Comment) Unstageable: R/L buttocks Other: IAD R/L buttocks and into labia  Last BM:  8/31  Height:   Ht Readings from Last 1 Encounters:  03/08/21 5\' 2"  (1.575 m)    Weight:  Wt Readings from Last 6 Encounters:  03/08/21 39.8 kg  03/05/21 39.7 kg  02/12/21 42.9 kg  02/07/21 42.8 kg  02/02/21 42.2 kg  01/29/21 41.1 kg   BMI:  There is no height or weight on file to calculate BMI.  Estimated Nutritional Needs:   Kcal:  1400-1600  Protein:  70-80 grams  Fluid:  >1.4L/d    Larkin Ina, MS, RD, LDN (she/her/hers) RD pager number and weekend/on-call pager number located in [place].

## 2021-03-15 NOTE — ED Notes (Signed)
Patient incontinent of urine prior to transport to [facility].  [facility] at bedside.  Patient cleansed and new adult brief placed.  Barrier cream reapplied.

## 2021-03-15 NOTE — Consult Note (Signed)
[place]  [phone] Fax:([phone]) [place]  Reason for Consultation: Metastatic lung cancer/Pancytopenia  HPI: Loretta Woods is a 65 year old female with a past medical history significant for metastatic lung adenocarcinoma, hypertension, hyperlipidemia, hypothyroidism, seizures.  She presented to the hospital with altered mental status.  She is followed by our office and received whole brain radiation and palliative radiation to the lung and recently started systemic chemotherapy with carboplatin, Alimta, Keytruda.  First cycle was given on 03/05/2021.  On admission, she was noted to have pancytopenia.  She has been given 1 unit of platelets so far today.  She also received 1 dose of Granix on admission.  MRI of the brain showed multiple metastatic deposits which appears stable with surrounding edema.  She is receiving dexamethasone and antibiotics.  Blood cultures positive for E. coli and Proteus.  She has been seen by ID who recommended 10 days of IV antibiotics.  They do not recommend Port-A-Cath removal.  I met with patient and her mother who was at the bedside.  The patient knows that she is in the hospital and knows her name.  However, she is not oriented to time.  She had a low-grade fever at the time of my visit.  She is somewhat somnolent.  Her mother tells me that she remains somewhat confused still.  She thinks her vision is worsening.  She has not seen any bleeding.  Her mother is also worried about her lack of appetite and weight loss.  Medical oncology was asked see the patient make recommendations regarding her metastatic lung cancer and pancytopenia.  Past Medical History:  Diagnosis Date   Hypertension    Thyroid disease   :   Past Surgical History:  Procedure Laterality Date   CRYOTHERAPY  01/19/2021   Procedure: CRYOTHERAPY;  Surgeon: Garner Nash, DO;  Location: [facility];  Service: Pulmonary;;   HEMOSTASIS  CONTROL  01/19/2021   Procedure: HEMOSTASIS CONTROL;  Surgeon: Garner Nash, DO;  Location: [facility];  Service: Pulmonary;;   IR IMAGING GUIDED PORT INSERTION  02/27/2021   VIDEO BRONCHOSCOPY  01/19/2021   Procedure: VIDEO BRONCHOSCOPY WITHOUT FLUORO;  Surgeon: Garner Nash, DO;  Location: [facility];  Service: Pulmonary;;  :   Current Facility-Administered Medications  Medication Dose Route Frequency Provider Last Rate Last Admin   acetaminophen (TYLENOL) tablet 650 mg  650 mg Oral Q6H PRN Marylyn Ishihara, Tyrone A, DO       Or   acetaminophen (TYLENOL) suppository 650 mg  650 mg Rectal Q6H PRN Marylyn Ishihara, Tyrone A, DO       atorvastatin (LIPITOR) tablet 10 mg  10 mg Oral Daily Kyle, Tyrone A, DO       cefTRIAXone (ROCEPHIN) 2 g in sodium chloride 0.9 % 100 mL IVPB  2 g Intravenous Q24H Laren Everts, RPH 200 mL/hr at 03/15/21 0843 2 g at 03/15/21 0086   Chlorhexidine Gluconate Cloth 2 % PADS 6 each  6 each Topical Daily Caren Griffins, MD       dexamethasone (DECADRON) tablet 4 mg  4 mg Oral BID Marylyn Ishihara, Tyrone A, DO   4 mg at 76/19/50 9326   folic acid (FOLVITE) tablet 1 mg  1 mg Oral Daily Kyle, Tyrone A, DO       hydrochlorothiazide (HYDRODIURIL) tablet 25 mg  25 mg Oral Daily Kyle, Tyrone A, DO       levETIRAcetam (KEPPRA) tablet 500 mg  500 mg  Oral BID Cherylann Ratel A, DO   500 mg at 03/15/21 0020   levothyroxine (SYNTHROID) tablet 75 mcg  75 mcg Oral Q0600 Cherylann Ratel A, DO   75 mcg at 03/15/21 0631   metoprolol tartrate (LOPRESSOR) injection 5 mg  5 mg Intravenous Q6H PRN Cherylann Ratel A, DO   5 mg at 03/14/21 2042   ondansetron (ZOFRAN) tablet 4 mg  4 mg Oral Q6H PRN Cherylann Ratel A, DO       Or   ondansetron (ZOFRAN) injection 4 mg  4 mg Intravenous Q6H PRN Marylyn Ishihara, Tyrone A, DO       potassium chloride SA (KLOR-CON) CR tablet 40 mEq  40 mEq Oral Daily Kyle, Tyrone A, DO       prochlorperazine (COMPAZINE) tablet 10 mg  10 mg Oral Q6H PRN Marylyn Ishihara, Tyrone A, DO       sodium chloride flush (NS)  0.9 % injection 10-40 mL  10-40 mL Intracatheter Q12H Gherghe, Costin M, MD       sodium chloride flush (NS) 0.9 % injection 10-40 mL  10-40 mL Intracatheter PRN Caren Griffins, MD       thiamine (B-1) injection 100 mg  100 mg Intravenous Daily Donnetta Simpers, MD       Or   thiamine tablet 100 mg  100 mg Oral Daily Donnetta Simpers, MD   100 mg at 03/15/21 1638     No Known Allergies:   Family History  Problem Relation Age of Onset   Bladder Cancer Father   :   Social History   Socioeconomic History   Marital status: Divorced    Spouse name: Not on file   Number of children: 0   Years of education: Not on file   Highest education level: Not on file  Occupational History   Not on file  Tobacco Use   Smoking status: Former    Packs/day: 1.50    Years: 37.00    Pack years: 55.50    Types: Cigarettes    Start date: 07/15/1973    Quit date: 2012    Years since quitting: 10.6   Smokeless tobacco: Never  Vaping Use   Vaping Use: Every day  Substance and Sexual Activity   Alcohol use: Never   Drug use: Never   Sexual activity: Not on file  Other Topics Concern   Not on file  Social History Narrative   Not on file   Social Determinants of Health   Financial Resource Strain: Low Risk    Difficulty of Paying Living Expenses: Not hard at all  Food Insecurity: No Food Insecurity   Worried About Charity fundraiser in the Last Year: Never true   Arboriculturist in the Last Year: Never true  Transportation Needs: No Transportation Needs   Lack of Transportation (Medical): No   Lack of Transportation (Non-Medical): No  Physical Activity: Not on file  Stress: Not on file  Social Connections: Not on file  Intimate Partner Violence: Not on file  :  Review of Systems: Unable to obtain secondary to encephalopathy.  Exam: Patient Vitals for the past 24 hrs:  BP Temp Temp src Pulse Resp SpO2  03/15/21 1115 (!) 146/93 (!) 97.1 F (36.2 C) Axillary 98 18 96 %   03/15/21 1026 (!) 149/87 98.6 F (37 C) Axillary (!) 107 20 94 %  03/15/21 0803 (!) 148/93 98.7 F (37.1 C) Oral 97 14 95 %  03/15/21 0538 (!) 136/93  99.3 F (37.4 C) Oral 91 17 98 %  03/15/21 0400 (!) 150/92 -- -- 94 (!) 23 94 %  03/15/21 0237 -- 98.8 F (37.1 C) Oral -- -- 93 %  03/15/21 0230 (!) 158/95 -- -- 98 (!) 25 --  03/15/21 0200 (!) 170/92 -- -- 87 18 97 %  03/15/21 0100 (!) 170/108 -- -- -- (!) 23 --  03/15/21 0030 (!) 165/116 -- -- -- (!) 23 --  03/15/21 0000 (!) 170/92 -- -- 84 20 96 %  03/14/21 2330 (!) 172/94 -- -- 90 (!) 21 96 %  03/14/21 2200 (!) 160/100 -- -- 83 (!) 22 97 %  03/14/21 2130 (!) 155/94 -- -- 81 (!) 24 100 %  03/14/21 2100 (!) 152/96 -- -- 80 (!) 21 94 %  03/14/21 2000 (!) 160/100 -- -- (!) 115 (!) 25 94 %  03/14/21 1930 (!) 172/111 -- -- (!) 119 (!) 28 97 %  03/14/21 1900 (!) 178/106 -- -- (!) 128 (!) 25 (!) 88 %  03/14/21 1645 (!) 182/105 -- -- (!) 115 (!) 22 96 %  03/14/21 1445 (!) 178/117 -- -- (!) 101 (!) 24 97 %  03/14/21 1430 (!) 174/110 -- -- 100 (!) 22 95 %  03/14/21 1321 (!) 177/102 98.9 F (37.2 C) Oral (!) 108 18 96 %    General: Chronically ill-appearing female, cachectic. Eyes:  no scleral icterus.   ENT:  There were no oropharyngeal lesions.   Respiratory: lungs were clear bilaterally without wheezing or crackles.   Cardiovascular:  Regular rate and rhythm, S1/S2, without murmur, rub or gallop.  There was no pedal edema.   GI:  abdomen was soft, flat, nontender, nondistended, without organomegaly.   Skin exam was without echymosis, petichae.   Neuro: Oriented to person and place.     Lab Results  Component Value Date   WBC 2.8 (L) 03/15/2021   HGB 9.7 (L) 03/15/2021   HCT 27.5 (L) 03/15/2021   PLT 6 (LL) 03/15/2021   GLUCOSE 81 03/15/2021   ALT 23 03/15/2021   AST 16 03/15/2021   NA 132 (L) 03/15/2021   K 2.8 (L) 03/15/2021   CL 95 (L) 03/15/2021   CREATININE 0.49 03/15/2021   BUN 16 03/15/2021   CO2 26 03/15/2021     CT Head Wo Contrast  Result Date: 03/14/2021 CLINICAL DATA:  Mental status change, unknown cause. Additional history provided: Confusion and weakness. EXAM: CT HEAD WITHOUT CONTRAST TECHNIQUE: Contiguous axial images were obtained from the base of the skull through the vertex without intravenous contrast. COMPARISON:  Brain MRI 02/06/2021.  Head CT 01/17/2021. FINDINGS: Brain: The patient has multiple known intracranial parenchymal metastases, better appreciated on the prior brain MRI of 02/06/2021. As before, the largest metastasis is present within the right temporal lobe. No definitively new metastasis is identified. There is local mass effect associated with the dominant right temporal lobe metastasis with partial effacement of the right lateral ventricle. However, there is no midline shift. Redemonstrated scattered foci of chronic encephalomalacia/gliosis within the bilateral cerebral hemispheres, the largest again within the mid left frontal lobe (for instance as seen on series 2, image 21). There are foci of parenchymal calcification within the left frontal and right temporal lobes. Background chronic small-vessel ischemic changes within the cerebral white matter and pons. Small chronic infarcts within the left cerebellum, many of which were better appreciated on the brain MRI of 02/06/2021 (acute at that time). No acute infarct is identified. No extra-axial  fluid collection. Vascular: No hyperdense vessel. Atherosclerotic calcifications. Skull: Normal. Negative for fracture or focal lesion. Sinuses/Orbits: Visualized orbits show no acute finding. Trace mucosal thickening within the right ethmoid air cells. Other: Trace fluid within the bilateral mastoid air cells. IMPRESSION: The patient has multiple known intracranial parenchymal metastases, better appreciated on the prior brain MRI of 02/06/2021. As before, the largest metastasis is present within the right temporal lobe. There is local mass effect  associated with this dominant metastasis, with partial effacement of the right lateral ventricle. However, there is no midline shift. No definitively new metastasis is identified. However, a contrast-enhanced brain MRI would have greater sensitivity for this indication. Redemonstrated scattered foci of chronic encephalomalacia/gliosis within the bilateral cerebral hemispheres. Stable chronic small-vessel ischemic disease within the cerebral white matter and pons. Small chronic infarcts within the left cerebellum, many of which were better appreciated on the prior brain MRI of 02/06/2021 (acute at that time). Trace bilateral mastoid effusions. Electronically Signed   By: Kellie Simmering D.O.   On: 03/14/2021 15:56   MR Brain W and Wo Contrast  Result Date: 03/14/2021 CLINICAL DATA:  History of metastatic lung cancer with brain metastasis. Altered mental status. EXAM: MRI HEAD WITHOUT AND WITH CONTRAST TECHNIQUE: Multiplanar, multiecho pulse sequences of the brain and surrounding structures were obtained without and with intravenous contrast. CONTRAST:  64m GADAVIST GADOBUTROL 1 MMOL/ML IV SOLN COMPARISON:  MRI head with contrast 02/06/2021 FINDINGS: Brain: Multiple metastatic deposits in the brain are identified on the prior study. The current study is degraded by motion particularly on the postcontrast images. Multiple metastatic deposits are present, the largest in the right temporal lobe. Multiple metastatic lesions continues show restricted diffusion. There is dural-based enhancement in the floor of the middle cranial fossa on the right with adjacent large cystic metastasis in the right temporal lobe. This lesion measures up to 5.0 x 2.8 cm, similar in size to the prior study. There is mild associated hemorrhage and moderate surrounding edema. Adjacent satellite lesion in the right superior temporal lobe measures 17 mm in diameter, unchanged. Enhancing lesion in the left occipital lobe 1 cm unchanged.  Subcentimeter lesion left medial temporal lobe unchanged. Multiple additional enhancing lesions seen on the prior study which is of better quality. Ventricle size normal. 3 mm midline shift to the left unchanged. Chronic infarct left frontal convexity unchanged. Chronic microvascular ischemic change in the pons. Chronic infarct left cerebellum. No acute infarct identified. Vascular: Normal arterial flow voids Skull and upper cervical spine: No skeletal metastasis. Sinuses/Orbits: Negative Other: None IMPRESSION: Multiple metastatic deposits in the brain which appear stable since 02/06/2021. Largest lesion in the right temporal lobe has a significant dural component likely due to dural metastasis or dural invasion. There is surrounding edema and local mass-effect. No significant progression of metastatic disease and no acute infarct identified. Image quality degraded by motion. Electronically Signed   By: CFranchot GalloM.D.   On: 03/14/2021 18:29   DG Chest Port 1 View  Result Date: 03/14/2021 CLINICAL DATA:  Altered level of consciousness. Generalized weakness and hematuria. History of lung cancer. EXAM: PORTABLE CHEST 1 VIEW COMPARISON:  01/18/2021 FINDINGS: A right jugular Port-A-Cath has been placed and terminates over the mid SVC. The cardiomediastinal silhouette is unchanged with normal heart size. Aortic atherosclerosis is noted. Persistent retrocardiac density corresponds to the known medial left lower lobe mass. Mild atelectasis or scarring is again noted more laterally in the left lung base. There is background hyperinflation with underlying emphysema.  The right lung is clear. No pneumothorax is identified. No acute osseous abnormality is seen. IMPRESSION: Known left lower lobe mass with left basilar atelectasis or scarring. Electronically Signed   By: Logan Bores M.D.   On: 03/14/2021 14:34   IR IMAGING GUIDED PORT INSERTION  Result Date: 02/27/2021 INDICATION: 65 year old with left lung cancer.  Port-A-Cath needed for chemotherapy. EXAM: FLUOROSCOPIC AND ULTRASOUND GUIDED PLACEMENT OF A SUBCUTANEOUS PORT COMPARISON:  None. MEDICATIONS: Moderate sedation ANESTHESIA/SEDATION: Versed 1.0 mg IV; Fentanyl 50 mcg IV; Moderate Sedation Time:  25 minutes The patient was continuously monitored during the procedure by the interventional radiology nurse under my direct supervision. FLUOROSCOPY TIME:  42 seconds, 2 mGy COMPLICATIONS: None immediate. PROCEDURE: The procedure, risks, benefits, and alternatives were explained to the patient. Questions regarding the procedure were encouraged and answered. The patient understands and consents to the procedure. Patient was placed supine on the interventional table. Ultrasound confirmed a patent right internal jugular vein. Ultrasound image was saved for documentation. The right chest and neck were cleaned with a skin antiseptic and a sterile drape was placed. Maximal barrier sterile technique was utilized including caps, mask, sterile gowns, sterile gloves, sterile drape, hand hygiene and skin antiseptic. The right neck was anesthetized with 1% lidocaine. Small incision was made in the right neck with a blade. Micropuncture set was placed in the right internal jugular vein with ultrasound guidance. The micropuncture wire was used for measurement purposes. The right chest was anesthetized with 1% lidocaine with epinephrine. #15 blade was used to make an incision and a subcutaneous port pocket was formed. [place] was assembled. Subcutaneous tunnel was formed with a stiff tunneling device. The port catheter was brought through the subcutaneous tunnel. The port was placed in the subcutaneous pocket. The micropuncture set was exchanged for a peel-away sheath. The catheter was placed through the peel-away sheath and the tip was positioned at the superior cavoatrial junction. Catheter placement was confirmed with fluoroscopy. The port was accessed and flushed with  heparinized saline. The port pocket was closed using two layers of absorbable sutures and Dermabond. The vein skin site was closed using a single layer of absorbable suture and Dermabond. Sterile dressings were applied. Patient tolerated the procedure well without an immediate complication. Ultrasound and fluoroscopic images were taken and saved for this procedure. IMPRESSION: Placement of a subcutaneous power-injectable port device. Catheter tip at the superior cavoatrial junction. Electronically Signed   By: Markus Daft M.D.   On: 02/27/2021 17:33     CT Head Wo Contrast  Result Date: 03/14/2021 CLINICAL DATA:  Mental status change, unknown cause. Additional history provided: Confusion and weakness. EXAM: CT HEAD WITHOUT CONTRAST TECHNIQUE: Contiguous axial images were obtained from the base of the skull through the vertex without intravenous contrast. COMPARISON:  Brain MRI 02/06/2021.  Head CT 01/17/2021. FINDINGS: Brain: The patient has multiple known intracranial parenchymal metastases, better appreciated on the prior brain MRI of 02/06/2021. As before, the largest metastasis is present within the right temporal lobe. No definitively new metastasis is identified. There is local mass effect associated with the dominant right temporal lobe metastasis with partial effacement of the right lateral ventricle. However, there is no midline shift. Redemonstrated scattered foci of chronic encephalomalacia/gliosis within the bilateral cerebral hemispheres, the largest again within the mid left frontal lobe (for instance as seen on series 2, image 21). There are foci of parenchymal calcification within the left frontal and right temporal lobes. Background chronic small-vessel ischemic changes within  the cerebral white matter and pons. Small chronic infarcts within the left cerebellum, many of which were better appreciated on the brain MRI of 02/06/2021 (acute at that time). No acute infarct is identified. No  extra-axial fluid collection. Vascular: No hyperdense vessel. Atherosclerotic calcifications. Skull: Normal. Negative for fracture or focal lesion. Sinuses/Orbits: Visualized orbits show no acute finding. Trace mucosal thickening within the right ethmoid air cells. Other: Trace fluid within the bilateral mastoid air cells. IMPRESSION: The patient has multiple known intracranial parenchymal metastases, better appreciated on the prior brain MRI of 02/06/2021. As before, the largest metastasis is present within the right temporal lobe. There is local mass effect associated with this dominant metastasis, with partial effacement of the right lateral ventricle. However, there is no midline shift. No definitively new metastasis is identified. However, a contrast-enhanced brain MRI would have greater sensitivity for this indication. Redemonstrated scattered foci of chronic encephalomalacia/gliosis within the bilateral cerebral hemispheres. Stable chronic small-vessel ischemic disease within the cerebral white matter and pons. Small chronic infarcts within the left cerebellum, many of which were better appreciated on the prior brain MRI of 02/06/2021 (acute at that time). Trace bilateral mastoid effusions. Electronically Signed   By: Kellie Simmering D.O.   On: 03/14/2021 15:56   MR Brain W and Wo Contrast  Result Date: 03/14/2021 CLINICAL DATA:  History of metastatic lung cancer with brain metastasis. Altered mental status. EXAM: MRI HEAD WITHOUT AND WITH CONTRAST TECHNIQUE: Multiplanar, multiecho pulse sequences of the brain and surrounding structures were obtained without and with intravenous contrast. CONTRAST:  7m GADAVIST GADOBUTROL 1 MMOL/ML IV SOLN COMPARISON:  MRI head with contrast 02/06/2021 FINDINGS: Brain: Multiple metastatic deposits in the brain are identified on the prior study. The current study is degraded by motion particularly on the postcontrast images. Multiple metastatic deposits are present, the  largest in the right temporal lobe. Multiple metastatic lesions continues show restricted diffusion. There is dural-based enhancement in the floor of the middle cranial fossa on the right with adjacent large cystic metastasis in the right temporal lobe. This lesion measures up to 5.0 x 2.8 cm, similar in size to the prior study. There is mild associated hemorrhage and moderate surrounding edema. Adjacent satellite lesion in the right superior temporal lobe measures 17 mm in diameter, unchanged. Enhancing lesion in the left occipital lobe 1 cm unchanged. Subcentimeter lesion left medial temporal lobe unchanged. Multiple additional enhancing lesions seen on the prior study which is of better quality. Ventricle size normal. 3 mm midline shift to the left unchanged. Chronic infarct left frontal convexity unchanged. Chronic microvascular ischemic change in the pons. Chronic infarct left cerebellum. No acute infarct identified. Vascular: Normal arterial flow voids Skull and upper cervical spine: No skeletal metastasis. Sinuses/Orbits: Negative Other: None IMPRESSION: Multiple metastatic deposits in the brain which appear stable since 02/06/2021. Largest lesion in the right temporal lobe has a significant dural component likely due to dural metastasis or dural invasion. There is surrounding edema and local mass-effect. No significant progression of metastatic disease and no acute infarct identified. Image quality degraded by motion. Electronically Signed   By: CFranchot GalloM.D.   On: 03/14/2021 18:29   DG Chest Port 1 View  Result Date: 03/14/2021 CLINICAL DATA:  Altered level of consciousness. Generalized weakness and hematuria. History of lung cancer. EXAM: PORTABLE CHEST 1 VIEW COMPARISON:  01/18/2021 FINDINGS: A right jugular Port-A-Cath has been placed and terminates over the mid SVC. The cardiomediastinal silhouette is unchanged with normal heart size. Aortic  atherosclerosis is noted. Persistent retrocardiac  density corresponds to the known medial left lower lobe mass. Mild atelectasis or scarring is again noted more laterally in the left lung base. There is background hyperinflation with underlying emphysema. The right lung is clear. No pneumothorax is identified. No acute osseous abnormality is seen. IMPRESSION: Known left lower lobe mass with left basilar atelectasis or scarring. Electronically Signed   By: Logan Bores M.D.   On: 03/14/2021 14:34   IR IMAGING GUIDED PORT INSERTION  Result Date: 02/27/2021 INDICATION: 65 year old with left lung cancer. Port-A-Cath needed for chemotherapy. EXAM: FLUOROSCOPIC AND ULTRASOUND GUIDED PLACEMENT OF A SUBCUTANEOUS PORT COMPARISON:  None. MEDICATIONS: Moderate sedation ANESTHESIA/SEDATION: Versed 1.0 mg IV; Fentanyl 50 mcg IV; Moderate Sedation Time:  25 minutes The patient was continuously monitored during the procedure by the interventional radiology nurse under my direct supervision. FLUOROSCOPY TIME:  42 seconds, 2 mGy COMPLICATIONS: None immediate. PROCEDURE: The procedure, risks, benefits, and alternatives were explained to the patient. Questions regarding the procedure were encouraged and answered. The patient understands and consents to the procedure. Patient was placed supine on the interventional table. Ultrasound confirmed a patent right internal jugular vein. Ultrasound image was saved for documentation. The right chest and neck were cleaned with a skin antiseptic and a sterile drape was placed. Maximal barrier sterile technique was utilized including caps, mask, sterile gowns, sterile gloves, sterile drape, hand hygiene and skin antiseptic. The right neck was anesthetized with 1% lidocaine. Small incision was made in the right neck with a blade. Micropuncture set was placed in the right internal jugular vein with ultrasound guidance. The micropuncture wire was used for measurement purposes. The right chest was anesthetized with 1% lidocaine with epinephrine.  #15 blade was used to make an incision and a subcutaneous port pocket was formed. [place] was assembled. Subcutaneous tunnel was formed with a stiff tunneling device. The port catheter was brought through the subcutaneous tunnel. The port was placed in the subcutaneous pocket. The micropuncture set was exchanged for a peel-away sheath. The catheter was placed through the peel-away sheath and the tip was positioned at the superior cavoatrial junction. Catheter placement was confirmed with fluoroscopy. The port was accessed and flushed with heparinized saline. The port pocket was closed using two layers of absorbable sutures and Dermabond. The vein skin site was closed using a single layer of absorbable suture and Dermabond. Sterile dressings were applied. Patient tolerated the procedure well without an immediate complication. Ultrasound and fluoroscopic images were taken and saved for this procedure. IMPRESSION: Placement of a subcutaneous power-injectable port device. Catheter tip at the superior cavoatrial junction. Electronically Signed   By: Markus Daft M.D.   On: 02/27/2021 17:33    Assessment and Plan:  Loretta Woods 65 y.o. female with medical history significant for metastatic adenocarcinoma of the lung now admitted to the hospital with acute metabolic encephalopathy, gram-negative bacteremia, and pancytopenia.  Her encephalopathy is likely multifactorial possibly related to her brain mets.  Her brain mets on MRI are overall stable.  Agree with continuation of dexamethasone and Keppra.  Continue supportive care.  Appreciate neurology consultation.  Blood cultures grew gram-negative rods showing E. coli and Proteus.  She is on ceftriaxone.  Appreciate ID consultation.  Anticipate that she will be on IV antibiotics for at least 10 days.  She has completed 1 cycle of chemotherapy.  Unfortunately, she developed pancytopenia which is likely related to her chemotherapy.  She received 1 dose of  Granix.  Her hemoglobin today is 9.7.  No need for PRBC transfusion unless hemoglobin is less than 7 if asymptomatic or less than 8 if she is symptomatic.  Platelet count today is 6000 and she has received a platelet transfusion already today.  Recommend platelet transfusion for platelet count less than 10,000 or less than 20,000 if she is actively bleeding.  Recommend daily CBC with differential.   #Acute metabolic encephalopathy --Likely multifactorial/possibly due to brain mets --Continue dexamethasone and Keppra --Neurology is following the patient  #Gram-negative bacteremia --Blood cultures positive for E. 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 Isidore Moos.  --Receiving Carbo/Pem/Pem chemotherapy until progression or intolerance, carbo to be dropped after cycle #4 --Guardant 360 pending.  Foundation 1 showed a PD-L1 score of 50%.  --repeat CT C/A/P in Oct 2022. MRI brain per Rad/onc  #Pancytopenia --Due to recent chemotherapy --Status post dose of Granix --Transfuse PRBCs for hemoglobin less than 7 if asymptomatic and less than 8 if symptomatic --Transfuse platelets if platelet count less than 10,000 or less than 20,000 with active bleeding   #Supportive Care -- May use zofran 33m q8H PRN and compazine 147mPO q6H for nausea  KrMikey BussingDNP, AGPCNP-BC, AOCNP

## 2021-03-15 NOTE — Progress Notes (Signed)
Pt noted to have no observed UO since 150 mL at late morning. MD Gherghe notified during at that time, and MD ordered fluids. RN bladder scanned at [place], charted 413 mL; On call [facility] hospitalist paged, No response to page at this time. Night RN Collie Siad informed.

## 2021-03-15 NOTE — Plan of Care (Signed)

## 2021-03-15 NOTE — Plan of Care (Signed)
  Problem: Education: Goal: Knowledge of disease or condition will improve Outcome: Progressing Goal: Knowledge of secondary prevention will improve Outcome: Progressing Goal: Knowledge of patient specific risk factors addressed and post discharge goals established will improve Outcome: Progressing   Problem: Coping: Goal: Will verbalize positive feelings about self Outcome: Progressing Goal: Will identify appropriate support needs Outcome: Progressing   Problem: Health Behavior/Discharge Planning: Goal: Ability to manage health-related needs will improve Outcome: Progressing   Problem: Self-Care: Goal: Ability to participate in self-care as condition permits will improve Outcome: Progressing Goal: Verbalization of feelings and concerns over difficulty with self-care will improve Outcome: Progressing Goal: Ability to communicate needs accurately will improve Outcome: Progressing   Problem: Nutrition: Goal: Risk of aspiration will decrease Outcome: Progressing Goal: Dietary intake will improve Outcome: Progressing   Problem: Intracerebral Hemorrhage Tissue Perfusion: Goal: Complications of Intracerebral Hemorrhage will be minimized Outcome: Progressing   Problem: Ischemic Stroke/TIA Tissue Perfusion: Goal: Complications of ischemic stroke/TIA will be minimized Outcome: Progressing   Problem: Spontaneous Subarachnoid Hemorrhage Tissue Perfusion: Goal: Complications of Spontaneous Subarachnoid Hemorrhage will be minimized Outcome: Progressing   Problem: Spontaneous Subarachnoid Hemorrhage Tissue Perfusion: Goal: Complications of Spontaneous Subarachnoid Hemorrhage will be minimized Outcome: Progressing

## 2021-03-15 NOTE — Progress Notes (Signed)
Patient arrived in the unit at  0527 from [facility], No s/s distress, denied of pain, initated telemetry monitor , paged neurology, and will continue to monitor closely.

## 2021-03-15 NOTE — ED Notes (Signed)
Patient transport to [facility] via [facility] at this time.  All personal belongings transported with patient

## 2021-03-15 NOTE — ED Notes (Signed)
Attempted to call report to floor for second time.  No answer from floor at this time.

## 2021-03-16 DIAGNOSIS — Z7189 Other specified counseling: Secondary | ICD-10-CM | POA: Diagnosis not present

## 2021-03-16 DIAGNOSIS — G9341 Metabolic encephalopathy: Secondary | ICD-10-CM | POA: Diagnosis not present

## 2021-03-16 DIAGNOSIS — I10 Essential (primary) hypertension: Secondary | ICD-10-CM | POA: Diagnosis not present

## 2021-03-16 DIAGNOSIS — R7881 Bacteremia: Secondary | ICD-10-CM | POA: Diagnosis not present

## 2021-03-16 DIAGNOSIS — Z66 Do not resuscitate: Secondary | ICD-10-CM | POA: Diagnosis not present

## 2021-03-16 LAB — COMPREHENSIVE METABOLIC PANEL
ALT: 19 U/L (ref 0–44)
AST: 12 U/L — ABNORMAL LOW (ref 15–41)
Albumin: 1.9 g/dL — ABNORMAL LOW (ref 3.5–5.0)
Alkaline Phosphatase: 53 U/L (ref 38–126)
Anion gap: 7 (ref 5–15)
BUN: 19 mg/dL (ref 8–23)
CO2: 25 mmol/L (ref 22–32)
Calcium: 7.7 mg/dL — ABNORMAL LOW (ref 8.9–10.3)
Chloride: 103 mmol/L (ref 98–111)
Creatinine, Ser: 0.48 mg/dL (ref 0.44–1.00)
GFR, Estimated: >60 mL/min (ref >60)
Glucose, Bld: 90 mg/dL (ref 70–99)
Potassium: 3.4 mmol/L — ABNORMAL LOW (ref 3.5–5.1)
Sodium: 135 mmol/L (ref 135–145)
Total Bilirubin: 1 mg/dL (ref 0.3–1.2)
Total Protein: 4.5 g/dL — ABNORMAL LOW (ref 6.5–8.1)

## 2021-03-16 LAB — CBC WITH DIFFERENTIAL/PLATELET
Abs Immature Granulocytes: 0.59 10*3/uL — ABNORMAL HIGH (ref 0.00–0.07)
Basophils Absolute: 0.1 10*3/uL (ref 0.0–0.1)
Basophils Relative: 1 %
Eosinophils Absolute: 0 10*3/uL (ref 0.0–0.5)
Eosinophils Relative: 0 %
HCT: 21.1 % — ABNORMAL LOW (ref 36.0–46.0)
Hemoglobin: 7.4 g/dL — ABNORMAL LOW (ref 12.0–15.0)
Immature Granulocytes: 14 %
Lymphocytes Relative: 15 %
Lymphs Abs: 0.6 10*3/uL — ABNORMAL LOW (ref 0.7–4.0)
MCH: 29.7 pg (ref 26.0–34.0)
MCHC: 35.1 g/dL (ref 30.0–36.0)
MCV: 84.7 fL (ref 80.0–100.0)
Monocytes Absolute: 0.3 10*3/uL (ref 0.1–1.0)
Monocytes Relative: 7 %
Neutro Abs: 2.5 10*3/uL (ref 1.7–7.7)
Neutrophils Relative %: 63 %
Platelets: 33 10*3/uL — ABNORMAL LOW (ref 150–400)
RBC: 2.49 MIL/uL — ABNORMAL LOW (ref 3.87–5.11)
RDW: 12.4 % (ref 11.5–15.5)
Smear Review: DECREASED
WBC: 4.1 10*3/uL (ref 4.0–10.5)
nRBC: 0 % (ref 0.0–0.2)

## 2021-03-16 LAB — BPAM PLATELET PHERESIS
Blood Product Expiration Date: 202209012359
ISSUE DATE / TIME: 202209011020
Unit Type and Rh: 5100

## 2021-03-16 LAB — PREPARE PLATELET PHERESIS: Unit division: 0

## 2021-03-16 LAB — HAPTOGLOBIN: Haptoglobin: 559 mg/dL — ABNORMAL HIGH (ref 37–355)

## 2021-03-16 MED ORDER — POTASSIUM CHLORIDE 20 MEQ PO PACK
40.0000 meq | PACK | Freq: Every day | ORAL | Status: DC
  Administered 2021-03-17 – 2021-03-27 (×11): 40 meq via ORAL
  Filled 2021-03-16 (×11): qty 2

## 2021-03-16 MED ORDER — LEVETIRACETAM 100 MG/ML PO SOLN
500.0000 mg | Freq: Two times a day (BID) | ORAL | Status: DC
  Administered 2021-03-16 – 2021-03-28 (×25): 500 mg via ORAL
  Filled 2021-03-16 (×26): qty 5

## 2021-03-16 NOTE — Progress Notes (Signed)
PT Cancellation Note  Patient Details Name: Loretta Woods MRN: [id number] DOB: August 22, 1955   Cancelled Treatment:    Reason Eval/Treat Not Completed: Other (comment).  Declines any mobility until she eats, which requires her dentures.  Pt does not have any denture cream, PT unable to locate any.  Follow up as time and pt allow.   Ramond Dial 03/16/2021, 10:21 AM  Mee Hives, PT MS Acute Rehab Dept. Number: [place] and [place]

## 2021-03-16 NOTE — Evaluation (Signed)
Physical Therapy Evaluation Patient Details Name: Loretta Woods MRN: [id number] DOB: [birth year] Today's Date: 03/16/2021   History of Present Illness  65 yo female admitted on 8/31 for progressive weakness from chemotherapy and new confusion with poor appetite.  Pt has family involved, who are aware of these issues.  Dx with acute metabolic encephalopathy from brain mets, bacteremia c mult bacteria.  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 PMHx:  seizures, deconditioning, HTN, HLD, lung adenocarcinoma,  Clinical Impression  Pt was seen for mobility on side of bed then to take sidesteps on RW.  Her endurance is limited as well as LE control proximally.  Pt is going to need rehab to restore her  ability to walk a longer trip, to walk with independence and to avoid a fall at home.  Her support is her mother, who will not be able to physically care for pt unless she is fairly mobile alone.  Follow for acute PT goals, focusing on safety and balance, as well as setting self limits for her need to rest given her weaker condition.     Follow Up Recommendations SNF    Equipment Recommendations  Rolling walker with 5" wheels    Recommendations for Other Services       Precautions / Restrictions Precautions Precautions: Fall Restrictions Weight Bearing Restrictions: No      Mobility  Bed Mobility Overal bed mobility: Needs Assistance Bed Mobility: Supine to Sit;Sit to Supine     Supine to sit: Min assist Sit to supine: Min assist   General bed mobility comments: min assist to scoot legs off bed and assist to sit as well as reversing this    Transfers Overall transfer level: Needs assistance Equipment used: Rolling walker (2 wheeled);1 person hand held assist Transfers: Sit to/from Stand Sit to Stand: Min assist         General transfer comment: light assist to power up but is fatigued quickly  Ambulation/Gait Ambulation/Gait assistance: Min  assist Gait Distance (Feet): 10 Feet Assistive device: Rolling walker (2 wheeled);1 person hand held assist Gait Pattern/deviations: Step-to pattern;Decreased stride length;Narrow base of support Gait velocity: reduced Gait velocity interpretation: <1.31 ft/sec, indicative of household ambulator General Gait Details: sidesteps on side of bed, using bed and walker for support and control of posture, pt has general weakness and needs support to descend to bed  Stairs            Wheelchair Mobility    Modified Rankin (Stroke Patients Only)       Balance Overall balance assessment: Needs assistance   Sitting balance-Leahy Scale: Fair     Standing balance support: Bilateral upper extremity supported;During functional activity Standing balance-Leahy Scale: Poor                               Pertinent Vitals/Pain Pain Assessment: Faces Faces Pain Scale: Hurts little more Pain Location: back Pain Descriptors / Indicators: Guarding Pain Intervention(s): Monitored during session;Premedicated before session;Repositioned;Ice applied    Home Living Family/patient expects to be discharged to:: Private residence Living Arrangements: Parent Available Help at Discharge: Family;Available 24 hours/day Type of Home: House Home Access: Ramped entrance     Home Layout: One level Home Equipment: Shower seat;Cane - single point;Walker - 2 wheels;Grab bars - toilet Additional Comments: some equipment from the mother    Prior Function Level of Independence: Independent  Comments: pt was not on AD, did not have a recent fall     Hand Dominance   Dominant Hand: Right    Extremity/Trunk Assessment   Upper Extremity Assessment Upper Extremity Assessment: Generalized weakness    Lower Extremity Assessment Lower Extremity Assessment: Generalized weakness    Cervical / Trunk Assessment Cervical / Trunk Assessment: Other exceptions (back pain)   Communication   Communication: No difficulties  Cognition Arousal/Alertness: Awake/alert Behavior During Therapy: Flat affect Overall Cognitive Status: Impaired/Different from baseline Area of Impairment: Following commands;Attention                   Current Attention Level: Selective   Following Commands: Follows one step commands with increased time       General Comments: slow responses to all mobility cues and limited energy to stand      General Comments General comments (skin integrity, edema, etc.): while pt needs minimal assist, her stability is a concern and will be a fall risk unsupervised to move    Exercises     Assessment/Plan    PT Assessment Patient needs continued PT services  PT Problem List Decreased strength;Decreased activity tolerance;Decreased balance;Decreased mobility;Decreased coordination;Decreased knowledge of use of DME;Decreased safety awareness;Decreased knowledge of precautions;Decreased skin integrity;Pain       PT Treatment Interventions DME instruction;Gait training;Functional mobility training;Therapeutic activities;Therapeutic exercise;Balance training;Neuromuscular re-education;Patient/family education    PT Goals (Current goals can be found in the Care Plan section)  Acute Rehab PT Goals Patient Stated Goal: to get stronger and go home PT Goal Formulation: With patient/family Time For Goal Achievement: 03/30/21 Potential to Achieve Goals: Good    Frequency Min 2X/week   Barriers to discharge Decreased caregiver support home with her mother, may not be able to assist fully yet    Co-evaluation               AM-PAC PT "6 Clicks" Mobility  Outcome Measure Help needed turning from your back to your side while in a flat bed without using bedrails?: A Little Help needed moving from lying on your back to sitting on the side of a flat bed without using bedrails?: A Little Help needed moving to and from a bed to a chair  (including a wheelchair)?: A Little Help needed standing up from a chair using your arms (e.g., wheelchair or bedside chair)?: A Little Help needed to walk in hospital room?: A Little Help needed climbing 3-5 steps with a railing? : Total 6 Click Score: 16    End of Session Equipment Utilized During Treatment: Gait belt Activity Tolerance: Patient limited by fatigue;Treatment limited secondary to medical complications (Comment) Patient left: in bed;with call bell/phone within reach;with bed alarm set;with family/visitor present Nurse Communication: Mobility status PT Visit Diagnosis: Unsteadiness on feet (R26.81);Muscle weakness (generalized) (M62.81);Pain;Adult, failure to thrive (R62.7) Pain - Right/Left:  (back) Pain - part of body:  (back)    Time: 0802-2336 PT Time Calculation (min) (ACUTE ONLY): 28 min   Charges:   PT Evaluation $PT Eval Moderate Complexity: 1 Mod PT Treatments $Gait Training: 8-22 mins       Ramond Dial 03/16/2021, 2:49 PM Mee Hives, PT MS Acute Rehab Dept. Number: [place] and [place]

## 2021-03-16 NOTE — Progress Notes (Signed)
PROGRESS NOTE  Loretta Woods WYO:[id number] DOB: [birth year] DOA: 03/14/2021 PCP: Leonard Downing, MD   LOS: 2 days   Brief Narrative / Interim history: 65 year old female with history of stage IV lung cancer with brain metastasis, HTN, HLD, hypothyroidism, seizures came into the hospital with altered mental status.  Patient apparently completed brain radiation couple of weeks ago and has been placed on chemotherapy 10 days ago.  Over the last couple of weeks she is becoming progressively weaker.  She was also occasionally confused and has been having a poor appetite in the last week.  Subjective / 24h Interval events: Seems more alert today.  Feels overall stronger.  She is hungry this morning, denies any shortness of breath, denies any chest pain  Assessment & Plan: Principal Problem Acute metabolic encephalopathy-likely multifactorial, possibly related to the brain mets as well as bacteremia.  An MRI of the brain on admission showed multiple metastatic deposits stable since 02/06/2021, the largest lesion in the right temporal lobe has a significant dural component likely due to a dural metastasis while inpatient with surrounding edema and mass-effect. -For now continue Decadron to minimize swelling, continue Keppra, supportive care -Neurology consulted as well, signed off 9/1  Active Problems Gram-negative (E. coli, Proteus) bacteremia-blood cultures this morning were positive for gram-negative rods, BC ID showing E. coli as well as Proteus species -Continue to monitor final sensitivities, continue ceftriaxone -She had a mild elevation in her bilirubin, right upper quadrant ultrasound were unremarkable  Stage IV adenocarcinoma of the lung-status post whole brain radiation but beginning of August and started chemotherapy with Dr. Lorenso Courier 8/22.  Appreciate input  Deconditioning, weakness-PT evaluation pending,  Pancytopenia, chemotherapy-induced-she received Granix on admission, hold  further doses, WBC is 4.1 today with ANC 2500 -Received a unit of platelets 9/1 for platelets of 6, currently 33 -Hemoglobin this morning 7.4, no bleeding, likely chemotherapy-induced, transfuse for less than 7, continue to monitor  Hypothyroidism-TSH 7.0, no need to adjust her current Synthroid dose, not high enough to explain her lethargy and may be in the setting of acute illness.  Monitor as an outpatient and repeat TSH in 3 to 4 weeks  Seizure disorder-continue Keppra  Hypokalemia-due to poor p.o. intake, continue to replete  Hypertension-continue home regimen  Hyperlipidemia -continue statin  Goals of Care - appropriate to consult Palliative  Pressure injury, POA Pressure Injury 03/15/21 Buttocks Right;Left Unstageable - Full thickness tissue loss in which the base of the injury is covered by slough (yellow, tan, gray, green or brown) and/or eschar (tan, brown or black) in the wound bed. moisture associated skin  (Active)  03/15/21   Location: Buttocks  Location Orientation: Right;Left  Staging: Unstageable - Full thickness tissue loss in which the base of the injury is covered by slough (yellow, tan, gray, green or brown) and/or eschar (tan, brown or black) in the wound bed.  Wound Description (Comments): moisture associated skin damage to bilat buttocks with patchy areas of Unstageable pressure injuries  Present on Admission: Yes   Scheduled Meds:  atorvastatin  10 mg Oral Daily   Chlorhexidine Gluconate Cloth  6 each Topical Daily   dexamethasone  4 mg Oral BID   feeding supplement  237 mL Oral BID BM   folic acid  1 mg Oral Daily   Gerhardt's butt cream   Topical QID   hydrochlorothiazide  25 mg Oral Daily   levETIRAcetam  500 mg Oral BID   levothyroxine  75 mcg Oral Q0600   multivitamin with  minerals  1 tablet Oral Daily   potassium chloride  40 mEq Oral Daily   sodium chloride flush  10-40 mL Intracatheter Q12H   thiamine injection  100 mg Intravenous Daily   Or    thiamine  100 mg Oral Daily   Continuous Infusions:  sodium chloride 100 mL/hr at 03/16/21 0542   cefTRIAXone (ROCEPHIN)  IV 2 g (03/16/21 0820)   PRN Meds:.acetaminophen **OR** acetaminophen, metoprolol tartrate, ondansetron **OR** ondansetron (ZOFRAN) IV, prochlorperazine, sodium chloride flush  Diet Orders (From admission, onward)     Start     Ordered   03/14/21 2239  Diet Heart Room service appropriate? Yes; Fluid consistency: Thin  Diet effective now       Question Answer Comment  Room service appropriate? Yes   Fluid consistency: Thin      03/14/21 2239            DVT prophylaxis: SCDs Start: 03/14/21 2239     Code Status: DNR  Family Communication: Sister Lora Havens [phone]  Status is: Inpatient  Remains inpatient appropriate because:Inpatient level of care appropriate due to severity of illness  Dispo: The patient is from: Home              Anticipated d/c is to: SNF              Patient currently is not medically stable to d/c.   Difficult to place patient No  Level of care: Progressive  Consultants:  Oncology Neurology  Procedures:  None   Microbiology  Blood cultures with E. coli and Proteus, final sensitivities pending  Antimicrobials: Ceftriaxone 9/1 >>   Objective: Vitals:   03/15/21 1955 03/15/21 2348 03/16/21 0430 03/16/21 0734  BP: 122/68 (!) 150/82 (!) 160/83 (!) 156/92  Pulse: 79 89 84 86  Resp: 19 18 18 20   Temp: 98 F (36.7 C) 98.9 F (37.2 C) 98.8 F (37.1 C) 98.2 F (36.8 C)  TempSrc: Oral Oral Oral Oral  SpO2: 96% 96% 96% 97%    Intake/Output Summary (Last 24 hours) at 03/16/2021 1142 Last data filed at 03/15/2021 1900 Gross per 24 hour  Intake 643 ml  Output 413 ml  Net 230 ml    There were no vitals filed for this visit.  Examination:  Constitutional: She is in no distress, pale Eyes: Anicteric ENMT: mmm Neck: normal, supple Respiratory: Lungs are clear bilaterally, no wheezing heard Cardiovascular:  Regular rate and rhythm, no murmurs, no peripheral edema Abdomen: Abdomen is soft, nontender, nondistended, positive bowel sounds Musculoskeletal: no clubbing / cyanosis.  Skin: No rashes appreciated Neurologic: Nonfocal, equal strength   Data Reviewed: I have independently reviewed following labs and imaging studies   CBC: Recent Labs  Lab 03/14/21 1353 03/15/21 0555 03/16/21 0332  WBC 1.5* 2.8* 4.1  NEUTROABS 1.2*  --  2.5  HGB 9.8* 9.7* 7.4*  HCT 28.3* 27.5* 21.1*  MCV 84.0 82.3 84.7  PLT 7* 6* 33*    Basic Metabolic Panel: Recent Labs  Lab 03/14/21 1353 03/15/21 0023 03/15/21 0555 03/16/21 0332  NA 138  --  132* 135  K 3.3*  --  2.8* 3.4*  CL 100  --  95* 103  CO2 28  --  26 25  GLUCOSE 96  --  81 90  BUN 18  --  16 19  CREATININE <0.30*  --  0.49 0.48  CALCIUM 8.4*  --  7.7* 7.7*  MG  --  1.7  --   --  Liver Function Tests: Recent Labs  Lab 03/14/21 1353 03/15/21 0555 03/16/21 0332  AST 15 16 12*  ALT 22 23 19   ALKPHOS 71 70 53  BILITOT 0.8 1.3* 1.0  PROT 5.9* 5.3* 4.5*  ALBUMIN 2.6* 2.2* 1.9*    Coagulation Profile: No results for input(s): INR, PROTIME in the last 168 hours. HbA1C: No results for input(s): HGBA1C in the last 72 hours. CBG: No results for input(s): GLUCAP in the last 168 hours.  Recent Results (from the past 240 hour(s))  Culture, blood (routine x 2)     Status: Abnormal (Preliminary result)   Collection Time: 03/14/21  1:43 PM   Specimen: Left Antecubital; Blood  Result Value Ref Range Status   Specimen Description   Final    LEFT ANTECUBITAL Performed at [facility] [address], [place], [place] [address]    Special Requests   Final    BOTTLES DRAWN AEROBIC AND ANAEROBIC Blood Culture adequate volume Performed at [facility] [place] [address] [place], [place] [address]    Culture  Setup Time   Final    GRAM NEGATIVE RODS IN BOTH AEROBIC AND ANAEROBIC BOTTLES Performed  at [facility], [place] [address], [place], [place] [address]    Culture ESCHERICHIA COLI PROTEUS MIRABILIS  (A)  Final   Report Status PENDING  Incomplete  Culture, blood (routine x 2)     Status: Abnormal (Preliminary result)   Collection Time: 03/14/21  1:53 PM   Specimen: Right Antecubital; Blood  Result Value Ref Range Status   Specimen Description   Final    RIGHT ANTECUBITAL Performed at [facility], [place] [address] [place], [place] [address]    Special Requests   Final    BOTTLES DRAWN AEROBIC AND ANAEROBIC Blood Culture adequate volume Performed at [facility] [address], [place], [place] [address]    Culture  Setup Time   Final    GRAM NEGATIVE RODS IN BOTH AEROBIC AND ANAEROBIC BOTTLES CRITICAL RESULT CALLED TO, READ BACK BY AND VERIFIED WITH: V BRYK,PHARMD@0723  03/15/21 [place] Performed at [facility], [address] [place], [place] [address]    Culture PROTEUS MIRABILIS ESCHERICHIA COLI  (A)  Final   Report Status PENDING  Incomplete  Blood Culture ID Panel (Reflexed)     Status: Abnormal   Collection Time: 03/14/21  1:53 PM  Result Value Ref Range Status   Enterococcus faecalis NOT DETECTED NOT DETECTED Final   Enterococcus Faecium NOT DETECTED NOT DETECTED Final   Listeria monocytogenes NOT DETECTED NOT DETECTED Final   Staphylococcus species NOT DETECTED NOT DETECTED Final   Staphylococcus aureus (BCID) NOT DETECTED NOT DETECTED Final   Staphylococcus epidermidis NOT DETECTED NOT DETECTED Final   Staphylococcus lugdunensis NOT DETECTED NOT DETECTED Final   Streptococcus species NOT DETECTED NOT DETECTED Final   Streptococcus agalactiae NOT DETECTED NOT DETECTED Final   Streptococcus pneumoniae NOT DETECTED NOT DETECTED Final   Streptococcus pyogenes NOT DETECTED NOT DETECTED Final   A.calcoaceticus-baumannii NOT DETECTED NOT DETECTED Final   Bacteroides fragilis NOT DETECTED NOT DETECTED Final    Enterobacterales DETECTED (A) NOT DETECTED Final    Comment: CRITICAL RESULT CALLED TO, READ BACK BY AND VERIFIED WITH: V BRYK,PHARMD@0723  03/15/21 [place]    Enterobacter cloacae complex NOT DETECTED NOT DETECTED Final   Escherichia coli DETECTED (A) NOT DETECTED Final    Comment: CRITICAL RESULT CALLED TO, READ BACK BY AND VERIFIED WITH:  V BRYK,PHARMD@0723  03/15/21 [place]    Klebsiella aerogenes NOT DETECTED NOT DETECTED Final   Klebsiella oxytoca NOT DETECTED NOT DETECTED Final   Klebsiella pneumoniae NOT DETECTED NOT DETECTED Final   Proteus species DETECTED (A) NOT DETECTED Final    Comment: CRITICAL RESULT CALLED TO, READ BACK BY AND VERIFIED WITH: V BRYK,PHARMD@0723  03/15/21 [place]    Salmonella species NOT DETECTED NOT DETECTED Final   Serratia marcescens NOT DETECTED NOT DETECTED Final   Haemophilus influenzae NOT DETECTED NOT DETECTED Final   Neisseria meningitidis NOT DETECTED NOT DETECTED Final   Pseudomonas aeruginosa NOT DETECTED NOT DETECTED Final   Stenotrophomonas maltophilia NOT DETECTED NOT DETECTED Final   Candida albicans NOT DETECTED NOT DETECTED Final   Candida auris NOT DETECTED NOT DETECTED Final   Candida glabrata NOT DETECTED NOT DETECTED Final   Candida krusei NOT DETECTED NOT DETECTED Final   Candida parapsilosis NOT DETECTED NOT DETECTED Final   Candida tropicalis NOT DETECTED NOT DETECTED Final   Cryptococcus neoformans/gattii NOT DETECTED NOT DETECTED Final   CTX-M ESBL NOT DETECTED NOT DETECTED Final   Carbapenem resistance IMP NOT DETECTED NOT DETECTED Final   Carbapenem resistance KPC NOT DETECTED NOT DETECTED Final   Carbapenem resistance NDM NOT DETECTED NOT DETECTED Final   Carbapenem resist OXA 48 LIKE NOT DETECTED NOT DETECTED Final   Carbapenem resistance VIM NOT DETECTED NOT DETECTED Final    Comment: Performed at [facility], [address] [place], [place] [address]  Resp Panel by RT-PCR (Flu A&B, Covid) Nasopharyngeal Swab     Status: None    Collection Time: 03/14/21  1:54 PM   Specimen: Nasopharyngeal Swab; Nasopharyngeal(NP) swabs in vial transport medium  Result Value Ref Range Status   SARS Coronavirus 2 by RT PCR NEGATIVE NEGATIVE Final    Comment: (NOTE) SARS-CoV-2 target nucleic acids are NOT DETECTED.  The SARS-CoV-2 RNA is generally detectable in upper respiratory specimens during the acute phase of infection. The lowest concentration of SARS-CoV-2 viral copies this assay can detect is 138 copies/mL. A negative result does not preclude SARS-Cov-2 infection and should not be used as the sole basis for treatment or other patient management decisions. A negative result may occur with  improper specimen collection/handling, submission of specimen other than nasopharyngeal swab, presence of viral mutation(s) within the areas targeted by this assay, and inadequate number of viral copies(<138 copies/mL). A negative result must be combined with clinical observations, patient history, and epidemiological information. The expected result is Negative.  Fact Sheet for Patients:  EntrepreneurPulse.com.au  Fact Sheet for Healthcare Providers:  IncredibleEmployment.be  This test is no t yet approved or cleared by the [place] FDA and  has been authorized for detection and/or diagnosis of SARS-CoV-2 by FDA under an Emergency Use Authorization (EUA). This EUA will remain  in effect (meaning this test can be used) for the duration of the COVID-19 declaration under Section 564(b)(1) of the Act, 21 U.S.C.section 360bbb-3(b)(1), unless the authorization is terminated  or revoked sooner.       Influenza A by PCR NEGATIVE NEGATIVE Final   Influenza B by PCR NEGATIVE NEGATIVE Final    Comment: (NOTE) The Xpert Xpress SARS-CoV-2/FLU/RSV plus assay is intended as an aid in the diagnosis of influenza from Nasopharyngeal swab specimens and should not be used as a sole basis for treatment.  Nasal washings and aspirates are unacceptable for Xpert Xpress SARS-CoV-2/FLU/RSV testing.  Fact Sheet for Patients: EntrepreneurPulse.com.au  Fact Sheet for Healthcare Providers: IncredibleEmployment.be  This test is not yet approved or cleared by the [place] and has been authorized for detection and/or diagnosis of SARS-CoV-2 by FDA under an Emergency Use Authorization (EUA). This EUA will remain in effect (meaning this test can be used) for the duration of the COVID-19 declaration under Section 564(b)(1) of the Act, 21 U.S.C. section 360bbb-3(b)(1), unless the authorization is terminated or revoked.  Performed at [facility], [place] [address], [place], [place] [address]       Radiology Studies: US Abdomen Limited RUQ (LIVER/GB)  Result Date: 03/15/2021 CLINICAL DATA:  Bacteremia EXAM: ULTRASOUND ABDOMEN LIMITED RIGHT UPPER QUADRANT COMPARISON:  None. FINDINGS: Gallbladder: Sludge in the gallbladder. No shadowing stones. Normal wall thickness. Negative sonographic Murphy. Common bile duct: Diameter: 3.5 mm Liver: No focal lesion identified. Within normal limits in parenchymal echogenicity. Portal vein is patent on color Doppler imaging with normal direction of blood flow towards the liver. Other: None. IMPRESSION: 1. Gallbladder sludge without sonographic evidence for acute gallbladder disease. 2. Otherwise negative right upper quadrant abdominal ultrasound Electronically Signed   By: Donavan Foil M.D.   On: 03/15/2021 21:08    Marzetta Board, MD, PhD [facility]  Between 7 am - 7 pm I am available, please contact me via Amion (for emergencies) or Securechat (non urgent messages)  Between 7 pm - 7 am I am not available, please contact night coverage MD/APP via Amion

## 2021-03-16 NOTE — Progress Notes (Signed)
   Daily Progress Note   Patient Name: Loretta Woods       Date: 03/16/2021 DOB: 03/21/1956  Age: 65 y.o. MRN#: [id number] Attending Physician: Caren Griffins, MD Primary Care Physician: Leonard Downing, MD Admit Date: 03/14/2021  Reason for Consultation/Follow-up: Establishing goals of care  Subjective: Chart Reviewed. Updates Received. Patient Assessed.   Patient sitting up in bed with dentures in hand. States she is feeling better today and is hungry however cannot eat due to no denture paste. She has dentures in hand. Denture glue provided to patient which she was appreciative of. Immediately applied and placed dentures in to prepare for meal tray.   No acute distress noted. Denies pain. States she is hopeful she can get up and moving as she wants to get back home soon.   Reviewed goals of care. Patient confirms DNR wishes. Goal is to continue to treat as she is remaining hopeful some stability.    Length of Stay: 2 days  Vital Signs: BP 124/68 (BP Location: Right Arm)   Pulse 97   Temp 98 F (36.7 C) (Oral)   Resp 18   SpO2 96%  SpO2: SpO2: 96 % O2 Device: O2 Device: Room Air O2 Flow Rate:    Physical Exam: NAD, cachectic, ill appearing  RRR Clear bilateally AAO x3, follows commands, mood appropriate             Palliative Care Assessment & Plan  HPI:  Palliative Care consult requested for goals of care discussion in this 65 y.o. female  with past medical history of  hypertension, stage IV lung cancer with brain metastasis, hypothyroidism, HLD, and seizures. She was admitted on 03/14/2021 from home with altered mental status. She is s/p whole brain radiation and cycle 1 of chemo 8/22 (Carbo/Pem/Pem) with Dr. Lorenso Courier.  Blood cultures are positive for gram-negative rods. ID is following.   Code Status: DNR  Goals of Care/Recommendations: Continue with current plan of care. Patient and family hopeful but also prepared for no significant improvement or further decline.  Taking it day by day.  Recommend outpatient palliative support in the home vs at the cancer center in collaboration with Oncology visits.  PMT will continue to support and follow.   Prognosis: Guarded-Poor   Discharge Planning: To Be Determined  Thank you for allowing the Palliative Medicine Team to assist in the care of this patient.  Time Total: 35 min.   Visit consisted of counseling and education dealing with the complex and emotionally intense issues of symptom management and palliative care in the setting of serious and potentially life-threatening illness.Greater than 50%  of this time was spent counseling and coordinating care related to the above assessment and plan.  Alda Lea, AGPCNP-BC  Palliative Medicine Team [phone]

## 2021-03-16 NOTE — Plan of Care (Signed)
  Problem: Education: Goal: Knowledge of disease or condition will improve Outcome: Progressing Goal: Knowledge of secondary prevention will improve Outcome: Progressing Goal: Knowledge of patient specific risk factors addressed and post discharge goals established will improve Outcome: Progressing   Problem: Coping: Goal: Will verbalize positive feelings about self Outcome: Progressing Goal: Will identify appropriate support needs Outcome: Progressing   Problem: Health Behavior/Discharge Planning: Goal: Ability to manage health-related needs will improve Outcome: Progressing   Problem: Self-Care: Goal: Ability to participate in self-care as condition permits will improve Outcome: Progressing Goal: Verbalization of feelings and concerns over difficulty with self-care will improve Outcome: Progressing Goal: Ability to communicate needs accurately will improve Outcome: Progressing   Problem: Nutrition: Goal: Risk of aspiration will decrease Outcome: Progressing   Problem: Intracerebral Hemorrhage Tissue Perfusion: Goal: Complications of Intracerebral Hemorrhage will be minimized Outcome: Progressing   Problem: Ischemic Stroke/TIA Tissue Perfusion: Goal: Complications of ischemic stroke/TIA will be minimized Outcome: Progressing   Problem: Spontaneous Subarachnoid Hemorrhage Tissue Perfusion: Goal: Complications of Spontaneous Subarachnoid Hemorrhage will be minimized Outcome: Progressing   Problem: Education: Goal: Knowledge of General Education information will improve Description: Including pain rating scale, medication(s)/side effects and non-pharmacologic comfort measures Outcome: Progressing   Problem: Health Behavior/Discharge Planning: Goal: Ability to manage health-related needs will improve Outcome: Progressing   Problem: Clinical Measurements: Goal: Ability to maintain clinical measurements within normal limits will improve Outcome: Progressing Goal: Will  remain free from infection Outcome: Progressing Goal: Diagnostic test results will improve Outcome: Progressing Goal: Respiratory complications will improve Outcome: Progressing Goal: Cardiovascular complication will be avoided Outcome: Progressing   Problem: Coping: Goal: Level of anxiety will decrease Outcome: Progressing   Problem: Elimination: Goal: Will not experience complications related to bowel motility Outcome: Progressing Goal: Will not experience complications related to urinary retention Outcome: Progressing   Problem: Safety: Goal: Ability to remain free from injury will improve Outcome: Progressing

## 2021-03-16 NOTE — Progress Notes (Signed)
[facility] for Infectious Disease  Date of Admission:  03/14/2021   Total days of antibiotics 2        Ceftriaxone  ASSESSMENT: Patient reports doing well.  No events overnight.  She has no acute complaints.  She does have a wound of the perineum due to irritation dermatitis.  This could also be a source of infection. Blood culture Gram stain confirms E. coli and Proteus.  Pending susceptibility.  Will continue ceftriaxone for now and adjust based on susceptibility.  Anticipate 10-day course of treatment.  PLAN: Continue ceftriaxone.   Pending susceptibility Wound care onboard  Active Problems:   Acute metabolic encephalopathy   Scheduled Meds:  atorvastatin  10 mg Oral Daily   Chlorhexidine Gluconate Cloth  6 each Topical Daily   dexamethasone  4 mg Oral BID   feeding supplement  237 mL Oral BID BM   folic acid  1 mg Oral Daily   Gerhardt's butt cream   Topical QID   hydrochlorothiazide  25 mg Oral Daily   levETIRAcetam  500 mg Oral BID   levothyroxine  75 mcg Oral Q0600   multivitamin with minerals  1 tablet Oral Daily   potassium chloride  40 mEq Oral Daily   sodium chloride flush  10-40 mL Intracatheter Q12H   thiamine injection  100 mg Intravenous Daily   Or   thiamine  100 mg Oral Daily   Continuous Infusions:  sodium chloride 100 mL/hr at 03/16/21 0542   cefTRIAXone (ROCEPHIN)  IV 2 g (03/16/21 0820)   PRN Meds:.acetaminophen **OR** acetaminophen, metoprolol tartrate, ondansetron **OR** ondansetron (ZOFRAN) IV, prochlorperazine, sodium chloride flush   SUBJECTIVE: Patient is seen at bedside.  She appears comfortable and in no acute distress.  States that she is doing well and denies any acute complaint.  States that she can eat without denture.  Advised patient to keep up with hydration.  Review of Systems: ROS  No Known Allergies  OBJECTIVE: Vitals:   03/15/21 1955 03/15/21 2348 03/16/21 0430 03/16/21 0734  BP: 122/68 (!) 150/82 (!) 160/83  (!) 156/92  Pulse: 79 89 84 86  Resp: 19 18 18 20   Temp: 98 F (36.7 C) 98.9 F (37.2 C) 98.8 F (37.1 C) 98.2 F (36.8 C)  TempSrc: Oral Oral Oral Oral  SpO2: 96% 96% 96% 97%   There is no height or weight on file to calculate BMI.  Physical Exam Constitutional:      General: She is not in acute distress.    Appearance: She is ill-appearing.  HENT:     Mouth/Throat:     Comments: Dry cracked lips Cardiovascular:     Rate and Rhythm: Normal rate and regular rhythm.  Pulmonary:     Effort: Pulmonary effort is normal.     Breath sounds: Normal breath sounds.  Skin:    General: Skin is warm.     Coloration: Skin is not jaundiced.  Neurological:     Mental Status: She is alert.  Psychiatric:        Behavior: Behavior normal.    Lab Results Lab Results  Component Value Date   WBC 4.1 03/16/2021   HGB 7.4 (L) 03/16/2021   HCT 21.1 (L) 03/16/2021   MCV 84.7 03/16/2021   PLT 33 (L) 03/16/2021    Lab Results  Component Value Date   CREATININE 0.48 03/16/2021   BUN 19 03/16/2021   NA 135 03/16/2021   K 3.4 (L)  03/16/2021   CL 103 03/16/2021   CO2 25 03/16/2021    Lab Results  Component Value Date   ALT 19 03/16/2021   AST 12 (L) 03/16/2021   ALKPHOS 53 03/16/2021   BILITOT 1.0 03/16/2021     Microbiology: Recent Results (from the past 240 hour(s))  Culture, blood (routine x 2)     Status: Abnormal (Preliminary result)   Collection Time: 03/14/21  1:43 PM   Specimen: Left Antecubital; Blood  Result Value Ref Range Status   Specimen Description   Final    LEFT ANTECUBITAL Performed at [facility], [place] [address], [place], [place] [address]    Special Requests   Final    BOTTLES DRAWN AEROBIC AND ANAEROBIC Blood Culture adequate volume Performed at [place] [address], [place], [place] [address]    Culture  Setup Time   Final    GRAM NEGATIVE RODS IN BOTH AEROBIC AND ANAEROBIC BOTTLES Performed at [facility], [place] [address], [place], [place] [address]    Culture ESCHERICHIA COLI PROTEUS MIRABILIS  (A)  Final   Report Status PENDING  Incomplete  Culture, blood (routine x 2)     Status: Abnormal (Preliminary result)   Collection Time: 03/14/21  1:53 PM   Specimen: Right Antecubital; Blood  Result Value Ref Range Status   Specimen Description   Final    RIGHT ANTECUBITAL Performed at [facility], [place] [address], [place], [place] [address]    Special Requests   Final    BOTTLES DRAWN AEROBIC AND ANAEROBIC Blood Culture adequate volume Performed at [place] [address] [place], [place] [address]    Culture  Setup Time   Final    GRAM NEGATIVE RODS IN BOTH AEROBIC AND ANAEROBIC BOTTLES CRITICAL RESULT CALLED TO, READ BACK BY AND VERIFIED WITH: V BRYK,PHARMD@0723  03/15/21 [place] Performed at [facility], [address], [place], [place] [address]    Culture PROTEUS MIRABILIS ESCHERICHIA COLI  (A)  Final   Report Status PENDING  Incomplete  Blood Culture ID Panel (Reflexed)     Status: Abnormal   Collection Time: 03/14/21  1:53 PM  Result Value Ref Range Status   Enterococcus faecalis NOT DETECTED NOT DETECTED Final   Enterococcus Faecium NOT DETECTED NOT DETECTED Final   Listeria monocytogenes NOT DETECTED NOT DETECTED Final   Staphylococcus species NOT DETECTED NOT DETECTED Final   Staphylococcus aureus (BCID) NOT DETECTED NOT DETECTED Final   Staphylococcus epidermidis NOT DETECTED NOT DETECTED Final   Staphylococcus lugdunensis NOT DETECTED NOT DETECTED Final   Streptococcus species NOT DETECTED NOT DETECTED Final   Streptococcus agalactiae NOT DETECTED NOT DETECTED Final   Streptococcus pneumoniae NOT DETECTED NOT DETECTED Final   Streptococcus pyogenes NOT DETECTED NOT DETECTED Final   A.calcoaceticus-baumannii NOT DETECTED NOT DETECTED Final   Bacteroides fragilis NOT DETECTED NOT DETECTED Final   Enterobacterales  DETECTED (A) NOT DETECTED Final    Comment: CRITICAL RESULT CALLED TO, READ BACK BY AND VERIFIED WITH: V BRYK,PHARMD@0723  03/15/21 [place]    Enterobacter cloacae complex NOT DETECTED NOT DETECTED Final   Escherichia coli DETECTED (A) NOT DETECTED Final    Comment: CRITICAL RESULT CALLED TO, READ BACK BY AND VERIFIED WITH: V BRYK,PHARMD@0723  03/15/21 [place]    Klebsiella aerogenes NOT DETECTED NOT DETECTED Final   Klebsiella oxytoca NOT DETECTED NOT DETECTED Final   Klebsiella pneumoniae NOT DETECTED NOT DETECTED Final   Proteus species  DETECTED (A) NOT DETECTED Final    Comment: CRITICAL RESULT CALLED TO, READ BACK BY AND VERIFIED WITH: V BRYK,PHARMD@0723  03/15/21 [place]    Salmonella species NOT DETECTED NOT DETECTED Final   Serratia marcescens NOT DETECTED NOT DETECTED Final   Haemophilus influenzae NOT DETECTED NOT DETECTED Final   Neisseria meningitidis NOT DETECTED NOT DETECTED Final   Pseudomonas aeruginosa NOT DETECTED NOT DETECTED Final   Stenotrophomonas maltophilia NOT DETECTED NOT DETECTED Final   Candida albicans NOT DETECTED NOT DETECTED Final   Candida auris NOT DETECTED NOT DETECTED Final   Candida glabrata NOT DETECTED NOT DETECTED Final   Candida krusei NOT DETECTED NOT DETECTED Final   Candida parapsilosis NOT DETECTED NOT DETECTED Final   Candida tropicalis NOT DETECTED NOT DETECTED Final   Cryptococcus neoformans/gattii NOT DETECTED NOT DETECTED Final   CTX-M ESBL NOT DETECTED NOT DETECTED Final   Carbapenem resistance IMP NOT DETECTED NOT DETECTED Final   Carbapenem resistance KPC NOT DETECTED NOT DETECTED Final   Carbapenem resistance NDM NOT DETECTED NOT DETECTED Final   Carbapenem resist OXA 48 LIKE NOT DETECTED NOT DETECTED Final   Carbapenem resistance VIM NOT DETECTED NOT DETECTED Final    Comment: Performed at [facility], [address], [place], [place] [address]  Resp Panel by RT-PCR (Flu A&B, Covid) Nasopharyngeal Swab     Status: None   Collection  Time: 03/14/21  1:54 PM   Specimen: Nasopharyngeal Swab; Nasopharyngeal(NP) swabs in vial transport medium  Result Value Ref Range Status   SARS Coronavirus 2 by RT PCR NEGATIVE NEGATIVE Final    Comment: (NOTE) SARS-CoV-2 target nucleic acids are NOT DETECTED.  The SARS-CoV-2 RNA is generally detectable in upper respiratory specimens during the acute phase of infection. The lowest concentration of SARS-CoV-2 viral copies this assay can detect is 138 copies/mL. A negative result does not preclude SARS-Cov-2 infection and should not be used as the sole basis for treatment or other patient management decisions. A negative result may occur with  improper specimen collection/handling, submission of specimen other than nasopharyngeal swab, presence of viral mutation(s) within the areas targeted by this assay, and inadequate number of viral copies(<138 copies/mL). A negative result must be combined with clinical observations, patient history, and epidemiological information. The expected result is Negative.  Fact Sheet for Patients:  EntrepreneurPulse.com.au  Fact Sheet for Healthcare Providers:  IncredibleEmployment.be  This test is no t yet approved or cleared by the [place] FDA and  has been authorized for detection and/or diagnosis of SARS-CoV-2 by FDA under an Emergency Use Authorization (EUA). This EUA will remain  in effect (meaning this test can be used) for the duration of the COVID-19 declaration under Section 564(b)(1) of the Act, 21 U.S.C.section 360bbb-3(b)(1), unless the authorization is terminated  or revoked sooner.       Influenza A by PCR NEGATIVE NEGATIVE Final   Influenza B by PCR NEGATIVE NEGATIVE Final    Comment: (NOTE) The Xpert Xpress SARS-CoV-2/FLU/RSV plus assay is intended as an aid in the diagnosis of influenza from Nasopharyngeal swab specimens and should not be used as a sole basis for treatment. Nasal washings  and aspirates are unacceptable for Xpert Xpress SARS-CoV-2/FLU/RSV testing.  Fact Sheet for Patients: EntrepreneurPulse.com.au  Fact Sheet for Healthcare Providers: IncredibleEmployment.be  This test is not yet approved or cleared by the [place] FDA and has been authorized for detection and/or diagnosis of SARS-CoV-2 by FDA under an Emergency Use Authorization (EUA). This EUA will remain in  effect (meaning this test can be used) for the duration of the COVID-19 declaration under Section 564(b)(1) of the Act, 21 U.S.C. section 360bbb-3(b)(1), unless the authorization is terminated or revoked.  Performed at [facility], [place] [address] [place], [place] [address]     Gaylan Gerold, [place] Infectious [facility] [phone] pager    03/16/2021, 10:26 AM

## 2021-03-17 ENCOUNTER — Other Ambulatory Visit: Payer: Self-pay

## 2021-03-17 DIAGNOSIS — R7881 Bacteremia: Secondary | ICD-10-CM | POA: Diagnosis not present

## 2021-03-17 DIAGNOSIS — G9341 Metabolic encephalopathy: Secondary | ICD-10-CM | POA: Diagnosis not present

## 2021-03-17 LAB — CULTURE, BLOOD (ROUTINE X 2)
Special Requests: ADEQUATE
Special Requests: ADEQUATE

## 2021-03-17 LAB — BASIC METABOLIC PANEL
Anion gap: 8 (ref 5–15)
BUN: 18 mg/dL (ref 8–23)
CO2: 27 mmol/L (ref 22–32)
Calcium: 7.9 mg/dL — ABNORMAL LOW (ref 8.9–10.3)
Chloride: 99 mmol/L (ref 98–111)
Creatinine, Ser: 0.43 mg/dL — ABNORMAL LOW (ref 0.44–1.00)
GFR, Estimated: >60 mL/min (ref >60)
Glucose, Bld: 111 mg/dL — ABNORMAL HIGH (ref 70–99)
Potassium: 3 mmol/L — ABNORMAL LOW (ref 3.5–5.1)
Sodium: 134 mmol/L — ABNORMAL LOW (ref 135–145)

## 2021-03-17 LAB — CBC
HCT: 20.7 % — ABNORMAL LOW (ref 36.0–46.0)
Hemoglobin: 7.4 g/dL — ABNORMAL LOW (ref 12.0–15.0)
MCH: 29.5 pg (ref 26.0–34.0)
MCHC: 35.7 g/dL (ref 30.0–36.0)
MCV: 82.5 fL (ref 80.0–100.0)
Platelets: 31 10*3/uL — ABNORMAL LOW (ref 150–400)
RBC: 2.51 MIL/uL — ABNORMAL LOW (ref 3.87–5.11)
RDW: 12.3 % (ref 11.5–15.5)
WBC: 6.9 10*3/uL (ref 4.0–10.5)
nRBC: 0.3 % — ABNORMAL HIGH (ref 0.0–0.2)

## 2021-03-17 MED ORDER — POTASSIUM CHLORIDE 20 MEQ PO PACK
40.0000 meq | PACK | Freq: Once | ORAL | Status: AC
  Administered 2021-03-17: 40 meq via ORAL
  Filled 2021-03-17: qty 2

## 2021-03-17 MED ORDER — ORAL CARE MOUTH RINSE
15.0000 mL | Freq: Two times a day (BID) | OROMUCOSAL | Status: DC
  Administered 2021-03-17 – 2021-03-28 (×23): 15 mL via OROMUCOSAL

## 2021-03-17 NOTE — Plan of Care (Signed)
Alert and oriented. Hemodynamically stable. C/o ongoing back pain- prns administered per order. Remains on RA. No BM this shift. Incontinent of urine. Wound care completed per order on unstageable wound. Q2 turns tolerated.  Problem: Self-Care: Goal: Ability to participate in self-care as condition permits will improve Outcome: Progressing   Problem: Activity: Goal: Risk for activity intolerance will decrease Outcome: Progressing   Problem: Nutrition: Goal: Adequate nutrition will be maintained Outcome: Not Progressing

## 2021-03-17 NOTE — Plan of Care (Signed)
  Problem: Education: Goal: Knowledge of disease or condition will improve Outcome: Progressing Goal: Knowledge of secondary prevention will improve Outcome: Progressing Goal: Knowledge of patient specific risk factors addressed and post discharge goals established will improve Outcome: Progressing   Problem: Coping: Goal: Will verbalize positive feelings about self Outcome: Progressing Goal: Will identify appropriate support needs Outcome: Progressing   Problem: Health Behavior/Discharge Planning: Goal: Ability to manage health-related needs will improve Outcome: Progressing   Problem: Self-Care: Goal: Ability to participate in self-care as condition permits will improve Outcome: Progressing Goal: Verbalization of feelings and concerns over difficulty with self-care will improve Outcome: Progressing Goal: Ability to communicate needs accurately will improve Outcome: Progressing   Problem: Nutrition: Goal: Risk of aspiration will decrease Outcome: Progressing Goal: Dietary intake will improve Outcome: Progressing   Problem: Intracerebral Hemorrhage Tissue Perfusion: Goal: Complications of Intracerebral Hemorrhage will be minimized Outcome: Progressing   Problem: Ischemic Stroke/TIA Tissue Perfusion: Goal: Complications of ischemic stroke/TIA will be minimized Outcome: Progressing   Problem: Spontaneous Subarachnoid Hemorrhage Tissue Perfusion: Goal: Complications of Spontaneous Subarachnoid Hemorrhage will be minimized Outcome: Progressing   Problem: Education: Goal: Knowledge of General Education information will improve Description: Including pain rating scale, medication(s)/side effects and non-pharmacologic comfort measures Outcome: Progressing   Problem: Health Behavior/Discharge Planning: Goal: Ability to manage health-related needs will improve Outcome: Progressing   Problem: Clinical Measurements: Goal: Ability to maintain clinical measurements within  normal limits will improve Outcome: Progressing Goal: Will remain free from infection Outcome: Progressing Goal: Diagnostic test results will improve Outcome: Progressing Goal: Respiratory complications will improve Outcome: Progressing Goal: Cardiovascular complication will be avoided Outcome: Progressing   Problem: Activity: Goal: Risk for activity intolerance will decrease Outcome: Progressing   Problem: Nutrition: Goal: Adequate nutrition will be maintained Outcome: Progressing   Problem: Coping: Goal: Level of anxiety will decrease Outcome: Progressing   Problem: Elimination: Goal: Will not experience complications related to bowel motility Outcome: Progressing Goal: Will not experience complications related to urinary retention Outcome: Progressing   Problem: Pain Managment: Goal: General experience of comfort will improve Outcome: Progressing   Problem: Safety: Goal: Ability to remain free from injury will improve Outcome: Progressing   Problem: Skin Integrity: Goal: Risk for impaired skin integrity will decrease Outcome: Progressing

## 2021-03-17 NOTE — TOC Initial Note (Addendum)
Transition of Care [facility]) - Initial/Assessment Note    Patient Details  Name: Loretta Woods MRN: [id number] Date of Birth: 04-Oct-1955  Transition of Care [facility]) CM/SW Contact:    Ina Homes, [place] Phone Number: 03/17/2021, 3:37 PM  Clinical Narrative:                  SW met with pt, niece Foye Clock and nephew Hermelinda Medicus [phone]) at bedside. Pt confirmed demographics. Pt reports iADL up until a week before admission. Pt began to feel more weak and required more assistance. Pt lives with mother. Pt reports having a rolling walker. Pt reports no hx of HH or SNF. SW discussed current recs for SNF. Pt/family agreeable and prefer [facility]. SW explained potential barriers with insurance and the need to ensure facilities are in network. Pt/family verbalized understanding. SW provided list of other SNF's in pt area to review with family. Pt reports she has had at least 2 doses of COVID vaccine but unsure if she has had booster. Pt's family able to transport at d/c. TOC to continue to follow for needs.   Expected Discharge Plan: Skilled Nursing Facility Barriers to Discharge: Ship broker, Continued Medical Work up, SNF Pending bed offer   Patient Goals and CMS Choice Patient states their goals for this hospitalization and ongoing recovery are:: Return home with family support CMS Medicare.gov Compare Post Acute Care list provided to:: Patient Represenative (must comment) (Niece and nephew at bedside) Choice offered to / list presented to : Patient  Expected Discharge Plan and Services Expected Discharge Plan: [place] Acute Care Choice: [place] Living arrangements for the past 2 months: Single Family Home           Prior Living Arrangements/Services Living arrangements for the past 2 months: Single Family Home Lives with:: Parents Patient language and need for interpreter reviewed:: Yes Do you feel safe going back to the place  where you live?: Yes            Criminal Activity/Legal Involvement Pertinent to Current Situation/Hospitalization: No - Comment as needed  Activities of Daily Living Home Assistive Devices/Equipment: Environmental consultant (specify type) ADL Screening (condition at time of admission) Patient's cognitive ability adequate to safely complete daily activities?: No Is the patient deaf or have difficulty hearing?: No Does the patient have difficulty seeing, even when wearing glasses/contacts?: Yes Does the patient have difficulty concentrating, remembering, or making decisions?: Yes Patient able to express need for assistance with ADLs?: No (mom says she might not ask for help) Does the patient have difficulty dressing or bathing?: Yes Independently performs ADLs?: No Communication: Independent Dressing (OT): Needs assistance Is this a change from baseline?: Pre-admission baseline Grooming: Needs assistance Is this a change from baseline?: Pre-admission baseline Feeding: Needs assistance Is this a change from baseline?: Pre-admission baseline Bathing: Needs assistance Is this a change from baseline?: Pre-admission baseline Toileting: Needs assistance Is this a change from baseline?: Pre-admission baseline In/Out Bed: Needs assistance Is this a change from baseline?: Pre-admission baseline Walks in Home: Dependent Is this a change from baseline?: Pre-admission baseline Does the patient have difficulty walking or climbing stairs?: Yes Weakness of Legs: Both Weakness of Arms/Hands: Both  Permission Sought/Granted Permission sought to share information with : Facility Sport and exercise psychologist, Family Supports Permission granted to share information with : Yes, Verbal Permission Granted     Permission granted to share info w AGENCY: SNF  Emotional Assessment Appearance:: Appears stated age Attitude/Demeanor/Rapport: Engaged Affect (typically observed): Accepting, Pleasant Orientation: :  Oriented to Self, Oriented to Place, Oriented to  Time, Oriented to Situation      Admission diagnosis:  Acute metabolic encephalopathy [G93.41] Patient Active Problem List   Diagnosis Date Noted   Acute metabolic encephalopathy 03/14/2021   Adenocarcinoma of left lung, stage 4 (HCC) 03/08/2021   Malignant neoplasm of lower lobe of left lung (HCC) 02/12/2021   Brain metastases (HCC) 02/02/2021   Endobronchial cancer, left (HCC)    Loculated pleural effusion    Brain mass 01/17/2021   Lung mass 01/17/2021   Hypertensive urgency 01/17/2021   Hyponatremia 01/17/2021   Hypokalemia 01/17/2021   PCP:  Elkins, Wilson Oliver, MD Pharmacy:   [facility] [place] [place] - [address] [place] [address] Phone: [phone] Fax: [phone]     Social Determinants of Health (SDOH) Interventions    Readmission Risk Interventions No flowsheet data found.   

## 2021-03-17 NOTE — NC FL2 (Addendum)
[place] LEVEL OF CARE SCREENING TOOL     IDENTIFICATION  Patient Name: Loretta Woods Birthdate: Jun 24, 1956 Sex: female Admission Date (Current Location): 03/14/2021  [facility] [place] Number:  Herbalist and Address:  [facility] [place], [place] [address], [place], [place] [address]      Provider Number:    Attending Physician Name and Address:  Caren Griffins, MD  Relative Name and Phone Number:  [name] [phone]    Current Level of Care: Hospital Recommended Level of Care: [place] Prior Approval Number:    Date Approved/Denied:   PASRR Number: [id number]  Discharge Plan: SNF    Current Diagnoses: Patient Active Problem List   Diagnosis Date Noted   Acute metabolic encephalopathy 37/34/2876   Adenocarcinoma of left lung, stage 4 ([place]) 03/08/2021   Malignant neoplasm of lower lobe of left lung ([place]) 02/12/2021   Brain metastases ([facility]) 02/02/2021   Endobronchial cancer, left (HCC)    Loculated pleural effusion    Brain mass 01/17/2021   Lung mass 01/17/2021   Hypertensive urgency 01/17/2021   Hyponatremia 01/17/2021   Hypokalemia 01/17/2021    Orientation RESPIRATION BLADDER Height & Weight     Self, Time, Situation, Place  Normal Continent Weight: 87 lb 12.8 oz (39.8 kg) Height:  5\' 2"  (157.5 cm)  BEHAVIORAL SYMPTOMS/MOOD NEUROLOGICAL BOWEL NUTRITION STATUS      Continent Diet (per discharge summary)  AMBULATORY STATUS COMMUNICATION OF NEEDS Skin   Limited Assist Verbally Other (Comment) (unstagable right buttocks pressure injury)                       Personal Care Assistance Level of Assistance  Bathing, Dressing Bathing Assistance: Limited assistance   Dressing Assistance: Limited assistance     Functional Limitations Info  Sight Sight Info: Impaired (brain mets)        SPECIAL CARE FACTORS FREQUENCY  PT (By licensed PT), OT (By licensed OT)     PT Frequency: per  facility OT Frequency: per facility            Contractures Contractures Info: Not present    Additional Factors Info  Code Status Code Status Info: DNR             Current Medications (03/17/2021):  This is the current hospital active medication list Current Facility-Administered Medications  Medication Dose Route Frequency Provider Last Rate Last Admin   acetaminophen (TYLENOL) tablet 650 mg  650 mg Oral Q6H PRN Marylyn Ishihara, Tyrone A, DO   650 mg at 03/17/21 0539   Or   acetaminophen (TYLENOL) suppository 650 mg  650 mg Rectal Q6H PRN Marylyn Ishihara, Tyrone A, DO       atorvastatin (LIPITOR) tablet 10 mg  10 mg Oral Daily Kyle, Tyrone A, DO   10 mg at 03/17/21 1058   cefTRIAXone (ROCEPHIN) 2 g in sodium chloride 0.9 % 100 mL IVPB  2 g Intravenous Q24H Laren Everts, RPH 200 mL/hr at 03/17/21 0859 2 g at 03/17/21 0859   Chlorhexidine Gluconate Cloth 2 % PADS 6 each  6 each Topical Daily Caren Griffins, MD   6 each at 03/17/21 1100   dexamethasone (DECADRON) tablet 4 mg  4 mg Oral BID Marylyn Ishihara, Tyrone A, DO   4 mg at 03/17/21 1058   feeding supplement (ENSURE ENLIVE / ENSURE PLUS) liquid 237 mL  237 mL Oral BID BM Karmen Bongo, MD   237  mL at 01/12/15 0109   folic acid (FOLVITE) tablet 1 mg  1 mg Oral Daily Kyle, Tyrone A, DO   1 mg at 03/17/21 1058   Gerhardt's butt cream   Topical QID Caren Griffins, MD   Given at 03/17/21 1430   hydrochlorothiazide (HYDRODIURIL) tablet 25 mg  25 mg Oral Daily Kyle, Tyrone A, DO   25 mg at 03/17/21 1058   levETIRAcetam (KEPPRA) 100 MG/ML solution 500 mg  500 mg Oral BID Skeet Simmer, RPH   500 mg at 03/17/21 1058   levothyroxine (SYNTHROID) tablet 75 mcg  75 mcg Oral Q0600 Marylyn Ishihara, Tyrone A, DO   75 mcg at 03/17/21 0532   MEDLINE mouth rinse  15 mL Mouth Rinse BID Caren Griffins, MD   15 mL at 03/17/21 1106   metoprolol tartrate (LOPRESSOR) injection 5 mg  5 mg Intravenous Q6H PRN Cherylann Ratel A, DO   5 mg at 03/14/21 2042   multivitamin with minerals  tablet 1 tablet  1 tablet Oral Daily Karmen Bongo, MD   1 tablet at 03/17/21 1058   ondansetron (ZOFRAN) tablet 4 mg  4 mg Oral Q6H PRN Cherylann Ratel A, DO       Or   ondansetron (ZOFRAN) injection 4 mg  4 mg Intravenous Q6H PRN Marylyn Ishihara, Tyrone A, DO   4 mg at 03/16/21 1243   potassium chloride (KLOR-CON) packet 40 mEq  40 mEq Oral Daily Skeet Simmer, RPH   40 mEq at 03/17/21 1058   prochlorperazine (COMPAZINE) tablet 10 mg  10 mg Oral Q6H PRN Marylyn Ishihara, Tyrone A, DO       sodium chloride flush (NS) 0.9 % injection 10-40 mL  10-40 mL Intracatheter Q12H Gherghe, Costin M, MD   10 mL at 03/17/21 1106   sodium chloride flush (NS) 0.9 % injection 10-40 mL  10-40 mL Intracatheter PRN Caren Griffins, MD       thiamine (B-1) injection 100 mg  100 mg Intravenous Daily Donnetta Simpers, MD       Or   thiamine tablet 100 mg  100 mg Oral Daily Donnetta Simpers, MD   100 mg at 03/17/21 1058     Discharge Medications: Please see discharge summary for a list of discharge medications.  Relevant Imaging Results:  Relevant Lab Results:   Additional Information COVID vaccine x2 pt unsure if had booster;  [place], LCSWA

## 2021-03-17 NOTE — Progress Notes (Signed)
PROGRESS NOTE  Loretta Woods OAC:[id number] DOB: 05-09-1956 DOA: 03/14/2021 PCP: Leonard Downing, MD   LOS: 3 days   Brief Narrative / Interim history: 65 year old female with history of stage IV lung cancer with brain metastasis, HTN, HLD, hypothyroidism, seizures came into the hospital with altered mental status.  Patient apparently completed brain radiation couple of weeks ago and has been placed on chemotherapy 10 days ago.  Over the last couple of weeks she is becoming progressively weaker.  She was also occasionally confused and has been having a poor appetite in the last week.  Subjective / 24h Interval events: Alert, no complaints.  No chest pain, no shortness of breath.  Assessment & Plan: Principal Problem Acute metabolic encephalopathy-likely multifactorial, possibly related to the brain mets as well as bacteremia.  An MRI of the brain on admission showed multiple metastatic deposits stable since 02/06/2021, the largest lesion in the right temporal lobe has a significant dural component likely due to a dural metastasis while inpatient with surrounding edema and mass-effect. -For now continue Decadron to minimize swelling, continue Keppra, supportive care -Neurology consulted as well, signed off 9/1  Active Problems Gram-negative (E. coli, Proteus) bacteremia-blood cultures were positive for gram-negative rods, BC ID showing E. coli as well as Proteus species -She is currently on ceftriaxone, continue, sensitivities show resistance to ampicillin and Augmentin for the E. coli but otherwise sensitive. -She had a mild elevation in her bilirubin, right upper quadrant ultrasound was unremarkable  Stage IV adenocarcinoma of the lung-status post whole brain radiation but beginning of August and started chemotherapy with Dr. Lorenso Courier 8/22.  Appreciate input  Deconditioning, weakness-PT recommends SNF, patient agreeable  Pancytopenia, chemotherapy-induced-she received Granix on admission,  hold further doses, white count is now normalized -Received a unit of platelets 9/1 for platelets of 6, currently 31, no need for further transfusions -Hemoglobin this morning 7.4, similar to yesterday, transfuse for less than 7 for now continue to monitor  Hypothyroidism-TSH 7.0, no need to adjust her current Synthroid dose, not high enough to explain her lethargy and may be in the setting of acute illness.  Monitor as an outpatient and repeat TSH in 3 to 4 weeks  Seizure disorder-continue Keppra  Hypokalemia-due to poor p.o. intake, continue to replete  Hypertension-continue home regimen  Hyperlipidemia -continue statin  Goals of Care - appropriate to consult Palliative  Pressure injury, POA Pressure Injury 03/15/21 Buttocks Right;Left Unstageable - Full thickness tissue loss in which the base of the injury is covered by slough (yellow, tan, gray, green or brown) and/or eschar (tan, brown or black) in the wound bed. moisture associated skin  (Active)  03/15/21   Location: Buttocks  Location Orientation: Right;Left  Staging: Unstageable - Full thickness tissue loss in which the base of the injury is covered by slough (yellow, tan, gray, green or brown) and/or eschar (tan, brown or black) in the wound bed.  Wound Description (Comments): moisture associated skin damage to bilat buttocks with patchy areas of Unstageable pressure injuries  Present on Admission: Yes   Scheduled Meds:  atorvastatin  10 mg Oral Daily   Chlorhexidine Gluconate Cloth  6 each Topical Daily   dexamethasone  4 mg Oral BID   feeding supplement  237 mL Oral BID BM   folic acid  1 mg Oral Daily   Gerhardt's butt cream   Topical QID   hydrochlorothiazide  25 mg Oral Daily   levETIRAcetam  500 mg Oral BID   levothyroxine  75 mcg Oral  Q0600   mouth rinse  15 mL Mouth Rinse BID   multivitamin with minerals  1 tablet Oral Daily   potassium chloride  40 mEq Oral Daily   sodium chloride flush  10-40 mL Intracatheter  Q12H   thiamine injection  100 mg Intravenous Daily   Or   thiamine  100 mg Oral Daily   Continuous Infusions:  cefTRIAXone (ROCEPHIN)  IV 2 g (03/17/21 0859)   PRN Meds:.acetaminophen **OR** acetaminophen, metoprolol tartrate, ondansetron **OR** ondansetron (ZOFRAN) IV, prochlorperazine, sodium chloride flush  Diet Orders (From admission, onward)     Start     Ordered   03/14/21 2239  Diet Heart Room service appropriate? Yes; Fluid consistency: Thin  Diet effective now       Question Answer Comment  Room service appropriate? Yes   Fluid consistency: Thin      03/14/21 2239            DVT prophylaxis: SCDs Start: 03/14/21 2239     Code Status: DNR  Family Communication: Sister Loretta Woods [phone]  Status is: Inpatient  Remains inpatient appropriate because:Inpatient level of care appropriate due to severity of illness  Dispo: The patient is from: Home              Anticipated d/c is to: SNF              Patient currently is not medically stable to d/c.   Difficult to place patient No  Level of care: Progressive  Consultants:  Oncology Neurology  Procedures:  None   Microbiology  Blood cultures with E. coli and Proteus, final sensitivities pending  Antimicrobials: Ceftriaxone 9/1 >>   Objective: Vitals:   03/17/21 0015 03/17/21 0324 03/17/21 0808 03/17/21 1218  BP:  (!) 155/83 (!) 149/88 (!) 145/74  Pulse:  80 83 99  Resp:  18 17 17   Temp:  97.9 F (36.6 C) 98.6 F (37 C) 97.7 F (36.5 C)  TempSrc:  Oral Oral Oral  SpO2:  94% 96% 95%  Weight: 39.8 kg     Height: 5\' 2"  (1.575 m)       Intake/Output Summary (Last 24 hours) at 03/17/2021 1343 Last data filed at 03/17/2021 0500 Gross per 24 hour  Intake 3000.63 ml  Output --  Net 3000.63 ml    Filed Weights   03/17/21 0015  Weight: 39.8 kg    Examination:  Constitutional: NAD Eyes: Anicteric ENMT: Moist mucous membranes Neck: normal, supple Respiratory: Clear bilaterally without  wheezing, no crackles Cardiovascular: Regular rate and rhythm, no murmurs, trace edema Abdomen: Soft, nontender, nondistended, bowel sounds positive Musculoskeletal: no clubbing / cyanosis.  Skin: No rashes seen Neurologic: No focal deficits   Data Reviewed: I have independently reviewed following labs and imaging studies   CBC: Recent Labs  Lab 03/14/21 1353 03/15/21 0555 03/16/21 0332 03/17/21 0324  WBC 1.5* 2.8* 4.1 6.9  NEUTROABS 1.2*  --  2.5  --   HGB 9.8* 9.7* 7.4* 7.4*  HCT 28.3* 27.5* 21.1* 20.7*  MCV 84.0 82.3 84.7 82.5  PLT 7* 6* 33* 31*    Basic Metabolic Panel: Recent Labs  Lab 03/14/21 1353 03/15/21 0023 03/15/21 0555 03/16/21 0332 03/17/21 0324  NA 138  --  132* 135 134*  K 3.3*  --  2.8* 3.4* 3.0*  CL 100  --  95* 103 99  CO2 28  --  26 25 27   GLUCOSE 96  --  81 90 111*  BUN 18  --  16 19 18   CREATININE <0.30*  --  0.49 0.48 0.43*  CALCIUM 8.4*  --  7.7* 7.7* 7.9*  MG  --  1.7  --   --   --     Liver Function Tests: Recent Labs  Lab 03/14/21 1353 03/15/21 0555 03/16/21 0332  AST 15 16 12*  ALT 22 23 19   ALKPHOS 71 70 53  BILITOT 0.8 1.3* 1.0  PROT 5.9* 5.3* 4.5*  ALBUMIN 2.6* 2.2* 1.9*    Coagulation Profile: No results for input(s): INR, PROTIME in the last 168 hours. HbA1C: No results for input(s): HGBA1C in the last 72 hours. CBG: No results for input(s): GLUCAP in the last 168 hours.  Recent Results (from the past 240 hour(s))  Culture, blood (routine x 2)     Status: Abnormal   Collection Time: 03/14/21  1:43 PM   Specimen: Left Antecubital; Blood  Result Value Ref Range Status   Specimen Description   Final    LEFT ANTECUBITAL Performed at [facility] [address], [place], [place] [address]    Special Requests   Final    BOTTLES DRAWN AEROBIC AND ANAEROBIC Blood Culture adequate volume Performed at [facility] [address], [place], [place] [address]    Culture  Setup  Time   Final    GRAM NEGATIVE RODS IN BOTH AEROBIC AND ANAEROBIC BOTTLES CRITICAL VALUE NOTED.  VALUE IS CONSISTENT WITH PREVIOUSLY REPORTED AND CALLED VALUE. Performed at [facility], [place] [address] [place], [place] [address]    Culture ESCHERICHIA COLI PROTEUS MIRABILIS  (A)  Final   Report Status 03/17/2021 FINAL  Final   Organism ID, Bacteria ESCHERICHIA COLI  Final   Organism ID, Bacteria PROTEUS MIRABILIS  Final      Susceptibility   Escherichia coli - MIC*    AMPICILLIN >=32 RESISTANT Resistant     CEFAZOLIN 8 SENSITIVE Sensitive     CEFEPIME <=0.12 SENSITIVE Sensitive     CEFTAZIDIME <=1 SENSITIVE Sensitive     CEFTRIAXONE <=0.25 SENSITIVE Sensitive     CIPROFLOXACIN <=0.25 SENSITIVE Sensitive     GENTAMICIN <=1 SENSITIVE Sensitive     IMIPENEM <=0.25 SENSITIVE Sensitive     TRIMETH/SULFA <=20 SENSITIVE Sensitive     AMPICILLIN/SULBACTAM >=32 RESISTANT Resistant     PIP/TAZO <=4 SENSITIVE Sensitive     * ESCHERICHIA COLI   Proteus mirabilis - MIC*    AMPICILLIN <=2 SENSITIVE Sensitive     CEFAZOLIN <=4 SENSITIVE Sensitive     CEFEPIME <=0.12 SENSITIVE Sensitive     CEFTAZIDIME <=1 SENSITIVE Sensitive     CEFTRIAXONE <=0.25 SENSITIVE Sensitive     CIPROFLOXACIN <=0.25 SENSITIVE Sensitive     GENTAMICIN <=1 SENSITIVE Sensitive     IMIPENEM 2 SENSITIVE Sensitive     TRIMETH/SULFA <=20 SENSITIVE Sensitive     AMPICILLIN/SULBACTAM <=2 SENSITIVE Sensitive     PIP/TAZO <=4 SENSITIVE Sensitive     * PROTEUS MIRABILIS  Culture, blood (routine x 2)     Status: Abnormal   Collection Time: 03/14/21  1:53 PM   Specimen: Right Antecubital; Blood  Result Value Ref Range Status   Specimen Description   Final    RIGHT ANTECUBITAL Performed at [facility], [place] [address] [place], [place] [address]    Special Requests   Final    BOTTLES DRAWN AEROBIC AND ANAEROBIC Blood Culture adequate volume Performed at [facility]  [address] [place],  [place] [address]    Culture  Setup Time   Final    GRAM NEGATIVE RODS IN BOTH AEROBIC AND ANAEROBIC BOTTLES CRITICAL RESULT CALLED TO, READ BACK BY AND VERIFIED WITH: V BRYK,PHARMD@0723  03/15/21 [place]    Culture (A)  Final    PROTEUS MIRABILIS ESCHERICHIA COLI SUSCEPTIBILITIES PERFORMED ON PREVIOUS CULTURE WITHIN THE LAST 5 DAYS. Performed at [facility], [place] [address], [place], [place] [address]    Report Status 03/17/2021 FINAL  Final  Blood Culture ID Panel (Reflexed)     Status: Abnormal   Collection Time: 03/14/21  1:53 PM  Result Value Ref Range Status   Enterococcus faecalis NOT DETECTED NOT DETECTED Final   Enterococcus Faecium NOT DETECTED NOT DETECTED Final   Listeria monocytogenes NOT DETECTED NOT DETECTED Final   Staphylococcus species NOT DETECTED NOT DETECTED Final   Staphylococcus aureus (BCID) NOT DETECTED NOT DETECTED Final   Staphylococcus epidermidis NOT DETECTED NOT DETECTED Final   Staphylococcus lugdunensis NOT DETECTED NOT DETECTED Final   Streptococcus species NOT DETECTED NOT DETECTED Final   Streptococcus agalactiae NOT DETECTED NOT DETECTED Final   Streptococcus pneumoniae NOT DETECTED NOT DETECTED Final   Streptococcus pyogenes NOT DETECTED NOT DETECTED Final   A.calcoaceticus-baumannii NOT DETECTED NOT DETECTED Final   Bacteroides fragilis NOT DETECTED NOT DETECTED Final   Enterobacterales DETECTED (A) NOT DETECTED Final    Comment: CRITICAL RESULT CALLED TO, READ BACK BY AND VERIFIED WITH: V BRYK,PHARMD@0723  03/15/21 [place]    Enterobacter cloacae complex NOT DETECTED NOT DETECTED Final   Escherichia coli DETECTED (A) NOT DETECTED Final    Comment: CRITICAL RESULT CALLED TO, READ BACK BY AND VERIFIED WITH: V BRYK,PHARMD@0723  03/15/21 [place]    Klebsiella aerogenes NOT DETECTED NOT DETECTED Final   Klebsiella oxytoca NOT DETECTED NOT DETECTED Final   Klebsiella pneumoniae NOT DETECTED NOT DETECTED Final   Proteus species DETECTED  (A) NOT DETECTED Final    Comment: CRITICAL RESULT CALLED TO, READ BACK BY AND VERIFIED WITH: V BRYK,PHARMD@0723  03/15/21 [place]    Salmonella species NOT DETECTED NOT DETECTED Final   Serratia marcescens NOT DETECTED NOT DETECTED Final   Haemophilus influenzae NOT DETECTED NOT DETECTED Final   Neisseria meningitidis NOT DETECTED NOT DETECTED Final   Pseudomonas aeruginosa NOT DETECTED NOT DETECTED Final   Stenotrophomonas maltophilia NOT DETECTED NOT DETECTED Final   Candida albicans NOT DETECTED NOT DETECTED Final   Candida auris NOT DETECTED NOT DETECTED Final   Candida glabrata NOT DETECTED NOT DETECTED Final   Candida krusei NOT DETECTED NOT DETECTED Final   Candida parapsilosis NOT DETECTED NOT DETECTED Final   Candida tropicalis NOT DETECTED NOT DETECTED Final   Cryptococcus neoformans/gattii NOT DETECTED NOT DETECTED Final   CTX-M ESBL NOT DETECTED NOT DETECTED Final   Carbapenem resistance IMP NOT DETECTED NOT DETECTED Final   Carbapenem resistance KPC NOT DETECTED NOT DETECTED Final   Carbapenem resistance NDM NOT DETECTED NOT DETECTED Final   Carbapenem resist OXA 48 LIKE NOT DETECTED NOT DETECTED Final   Carbapenem resistance VIM NOT DETECTED NOT DETECTED Final    Comment: Performed at [facility], [address] [place], [place] [address]  Resp Panel by RT-PCR (Flu A&B, Covid) Nasopharyngeal Swab     Status: None   Collection Time: 03/14/21  1:54 PM   Specimen: Nasopharyngeal Swab; Nasopharyngeal(NP) swabs in vial transport medium  Result Value Ref Range Status   SARS Coronavirus 2 by RT PCR NEGATIVE NEGATIVE Final    Comment: (NOTE)  SARS-CoV-2 target nucleic acids are NOT DETECTED.  The SARS-CoV-2 RNA is generally detectable in upper respiratory specimens during the acute phase of infection. The lowest concentration of SARS-CoV-2 viral copies this assay can detect is 138 copies/mL. A negative result does not preclude SARS-Cov-2 infection and should not be used as  the sole basis for treatment or other patient management decisions. A negative result may occur with  improper specimen collection/handling, submission of specimen other than nasopharyngeal swab, presence of viral mutation(s) within the areas targeted by this assay, and inadequate number of viral copies(<138 copies/mL). A negative result must be combined with clinical observations, patient history, and epidemiological information. The expected result is Negative.  Fact Sheet for Patients:  EntrepreneurPulse.com.au  Fact Sheet for Healthcare Providers:  IncredibleEmployment.be  This test is no t yet approved or cleared by the [place] FDA and  has been authorized for detection and/or diagnosis of SARS-CoV-2 by FDA under an Emergency Use Authorization (EUA). This EUA will remain  in effect (meaning this test can be used) for the duration of the COVID-19 declaration under Section 564(b)(1) of the Act, 21 U.S.C.section 360bbb-3(b)(1), unless the authorization is terminated  or revoked sooner.       Influenza A by PCR NEGATIVE NEGATIVE Final   Influenza B by PCR NEGATIVE NEGATIVE Final    Comment: (NOTE) The Xpert Xpress SARS-CoV-2/FLU/RSV plus assay is intended as an aid in the diagnosis of influenza from Nasopharyngeal swab specimens and should not be used as a sole basis for treatment. Nasal washings and aspirates are unacceptable for Xpert Xpress SARS-CoV-2/FLU/RSV testing.  Fact Sheet for Patients: EntrepreneurPulse.com.au  Fact Sheet for Healthcare Providers: IncredibleEmployment.be  This test is not yet approved or cleared by the [place] FDA and has been authorized for detection and/or diagnosis of SARS-CoV-2 by FDA under an Emergency Use Authorization (EUA). This EUA will remain in effect (meaning this test can be used) for the duration of the COVID-19 declaration under Section 564(b)(1) of  the Act, 21 U.S.C. section 360bbb-3(b)(1), unless the authorization is terminated or revoked.  Performed at [facility], [place] [address] [place], [place] [address]       Radiology Studies: No results found.  Marzetta Board, MD, PhD [facility]  Between 7 am - 7 pm I am available, please contact me via Amion (for emergencies) or Securechat (non urgent messages)  Between 7 pm - 7 am I am not available, please contact night coverage MD/APP via Amion

## 2021-03-18 DIAGNOSIS — R7881 Bacteremia: Secondary | ICD-10-CM | POA: Diagnosis not present

## 2021-03-18 DIAGNOSIS — G9341 Metabolic encephalopathy: Secondary | ICD-10-CM | POA: Diagnosis not present

## 2021-03-18 LAB — COMPREHENSIVE METABOLIC PANEL
ALT: 20 U/L (ref 0–44)
AST: 24 U/L (ref 15–41)
Albumin: 2 g/dL — ABNORMAL LOW (ref 3.5–5.0)
Alkaline Phosphatase: 68 U/L (ref 38–126)
Anion gap: 6 (ref 5–15)
BUN: 14 mg/dL (ref 8–23)
CO2: 28 mmol/L (ref 22–32)
Calcium: 8 mg/dL — ABNORMAL LOW (ref 8.9–10.3)
Chloride: 99 mmol/L (ref 98–111)
Creatinine, Ser: 0.32 mg/dL — ABNORMAL LOW (ref 0.44–1.00)
GFR, Estimated: >60 mL/min (ref >60)
Glucose, Bld: 124 mg/dL — ABNORMAL HIGH (ref 70–99)
Potassium: 4 mmol/L (ref 3.5–5.1)
Sodium: 133 mmol/L — ABNORMAL LOW (ref 135–145)
Total Bilirubin: 0.7 mg/dL (ref 0.3–1.2)
Total Protein: 4.7 g/dL — ABNORMAL LOW (ref 6.5–8.1)

## 2021-03-18 LAB — CBC WITH DIFFERENTIAL/PLATELET
Abs Immature Granulocytes: 5.39 10*3/uL — ABNORMAL HIGH (ref 0.00–0.07)
Basophils Absolute: 0.1 10*3/uL (ref 0.0–0.1)
Basophils Relative: 1 %
Eosinophils Absolute: 0 10*3/uL (ref 0.0–0.5)
Eosinophils Relative: 0 %
HCT: 21.1 % — ABNORMAL LOW (ref 36.0–46.0)
Hemoglobin: 7.6 g/dL — ABNORMAL LOW (ref 12.0–15.0)
Immature Granulocytes: 41 %
Lymphocytes Relative: 7 %
Lymphs Abs: 0.9 10*3/uL (ref 0.7–4.0)
MCH: 29.8 pg (ref 26.0–34.0)
MCHC: 36 g/dL (ref 30.0–36.0)
MCV: 82.7 fL (ref 80.0–100.0)
Monocytes Absolute: 0.9 10*3/uL (ref 0.1–1.0)
Monocytes Relative: 7 %
Neutro Abs: 5.9 10*3/uL (ref 1.7–7.7)
Neutrophils Relative %: 44 %
Platelets: 49 10*3/uL — ABNORMAL LOW (ref 150–400)
RBC: 2.55 MIL/uL — ABNORMAL LOW (ref 3.87–5.11)
RDW: 12.3 % (ref 11.5–15.5)
Smear Review: DECREASED
WBC: 13.1 10*3/uL — ABNORMAL HIGH (ref 4.0–10.5)
nRBC: 0.8 % — ABNORMAL HIGH (ref 0.0–0.2)

## 2021-03-18 LAB — VITAMIN B1: Vitamin B1 (Thiamine): 68.5 nmol/L (ref 66.5–200.0)

## 2021-03-18 LAB — MAGNESIUM: Magnesium: 1.8 mg/dL (ref 1.7–2.4)

## 2021-03-18 LAB — PHOSPHORUS: Phosphorus: 1.6 mg/dL — ABNORMAL LOW (ref 2.5–4.6)

## 2021-03-18 MED ORDER — SODIUM PHOSPHATES 45 MMOLE/15ML IV SOLN
10.0000 mmol | Freq: Once | INTRAVENOUS | Status: AC
  Administered 2021-03-18: 10 mmol via INTRAVENOUS
  Filled 2021-03-18: qty 3.33

## 2021-03-18 MED ORDER — MAGNESIUM SULFATE IN D5W 1-5 GM/100ML-% IV SOLN
1.0000 g | Freq: Once | INTRAVENOUS | Status: AC
  Administered 2021-03-18: 1 g via INTRAVENOUS
  Filled 2021-03-18: qty 100

## 2021-03-18 NOTE — Plan of Care (Signed)
  Problem: Education: Goal: Knowledge of General Education information will improve Description: Including pain rating scale, medication(s)/side effects and non-pharmacologic comfort measures Outcome: Progressing   Problem: Clinical Measurements: Goal: Diagnostic test results will improve Outcome: Progressing   Problem: Coping: Goal: Level of anxiety will decrease Outcome: Progressing   Problem: Elimination: Goal: Will not experience complications related to bowel motility Outcome: Progressing Goal: Will not experience complications related to urinary retention Outcome: Progressing

## 2021-03-18 NOTE — Progress Notes (Signed)
PROGRESS NOTE  Loretta Woods VEL:[id number] DOB: 05-20-1956 DOA: 03/14/2021 PCP: Leonard Downing, MD   LOS: 4 days   Brief Narrative / Interim history: 65 year old female with history of stage IV lung cancer with brain metastasis, HTN, HLD, hypothyroidism, seizures came into the hospital with altered mental status.  Patient apparently completed brain radiation couple of weeks ago and has been placed on chemotherapy 10 days ago.  Over the last couple of weeks she is becoming progressively weaker.  She was also occasionally confused and has been having a poor appetite in the last week.  Subjective / 24h Interval events: Eating breakfast, without complaints  Assessment & Plan: Principal Problem Acute metabolic encephalopathy-likely multifactorial, possibly related to the brain mets as well as bacteremia.  An MRI of the brain on admission showed multiple metastatic deposits stable since 02/06/2021, the largest lesion in the right temporal lobe has a significant dural component likely due to a dural metastasis while inpatient with surrounding edema and mass-effect. -For now continue Decadron to minimize swelling, continue Keppra, supportive care -Neurology consulted as well, signed off 9/1  Active Problems Gram-negative (E. coli, Proteus) bacteremia-blood cultures were positive for gram-negative rods, BC ID showing E. coli as well as Proteus species -She is currently on ceftriaxone, continue, sensitivities show resistance to ampicillin and Augmentin for the E. coli but otherwise sensitive. -She had a mild elevation in her bilirubin, right upper quadrant ultrasound was unremarkable  Stage IV adenocarcinoma of the lung-status post whole brain radiation but beginning of August and started chemotherapy with Dr. Lorenso Courier 8/22.  Appreciate input  Deconditioning, weakness-PT recommends SNF, patient agreeable, placement pending  Pancytopenia, chemotherapy-induced-she received Granix on admission, hold  further doses, white count is now normalized -Received a unit of platelets 9/1 for platelets of 6, currently 31, no need for further transfusions -Hemoglobin this morning 7.6, increasing on its own.  Platelets also higher today  Hypothyroidism-TSH 7.0, no need to adjust her current Synthroid dose, not high enough to explain her lethargy and may be in the setting of acute illness.  Monitor as an outpatient and repeat TSH in 3 to 4 weeks  Seizure disorder-continue Keppra  Hypokalemia-due to poor p.o. intake, continue to replete  Hypertension-continue home regimen  Hyperlipidemia -continue statin  Goals of Care - appropriate to consult Palliative  Pressure injury, POA Pressure Injury 03/15/21 Buttocks Right;Left Unstageable - Full thickness tissue loss in which the base of the injury is covered by slough (yellow, tan, gray, green or brown) and/or eschar (tan, brown or black) in the wound bed. moisture associated skin  (Active)  03/15/21   Location: Buttocks  Location Orientation: Right;Left  Staging: Unstageable - Full thickness tissue loss in which the base of the injury is covered by slough (yellow, tan, gray, green or brown) and/or eschar (tan, brown or black) in the wound bed.  Wound Description (Comments): moisture associated skin damage to bilat buttocks with patchy areas of Unstageable pressure injuries  Present on Admission: Yes   Scheduled Meds:  atorvastatin  10 mg Oral Daily   Chlorhexidine Gluconate Cloth  6 each Topical Daily   dexamethasone  4 mg Oral BID   feeding supplement  237 mL Oral BID BM   folic acid  1 mg Oral Daily   Gerhardt's butt cream   Topical QID   hydrochlorothiazide  25 mg Oral Daily   levETIRAcetam  500 mg Oral BID   levothyroxine  75 mcg Oral Q0600   mouth rinse  15 mL Mouth  Rinse BID   multivitamin with minerals  1 tablet Oral Daily   potassium chloride  40 mEq Oral Daily   sodium chloride flush  10-40 mL Intracatheter Q12H   thiamine injection   100 mg Intravenous Daily   Or   thiamine  100 mg Oral Daily   Continuous Infusions:  cefTRIAXone (ROCEPHIN)  IV 2 g (03/18/21 0831)   PRN Meds:.acetaminophen **OR** acetaminophen, metoprolol tartrate, ondansetron **OR** ondansetron (ZOFRAN) IV, prochlorperazine, sodium chloride flush  Diet Orders (From admission, onward)     Start     Ordered   03/14/21 2239  Diet Heart Room service appropriate? Yes; Fluid consistency: Thin  Diet effective now       Question Answer Comment  Room service appropriate? Yes   Fluid consistency: Thin      03/14/21 2239            DVT prophylaxis: SCDs Start: 03/14/21 2239    Code Status: DNR  Family Communication: Sister Lora Havens [phone]  Status is: Inpatient  Remains inpatient appropriate because:Inpatient level of care appropriate due to severity of illness  Dispo: The patient is from: Home              Anticipated d/c is to: SNF              Patient currently is not medically stable to d/c.   Difficult to place patient No  Level of care: Progressive  Consultants:  Oncology Neurology  Procedures:  None   Microbiology  Blood cultures with E. coli and Proteus, final sensitivities pending  Antimicrobials: Ceftriaxone 9/1 >>   Objective: Vitals:   03/17/21 2331 03/18/21 0338 03/18/21 0827 03/18/21 1130  BP: (!) 149/77 (!) 166/90 (!) 152/87 (!) 157/91  Pulse: 92 91 98 91  Resp: 17 17 19 18   Temp: 98.7 F (37.1 C) 98 F (36.7 C) 98.2 F (36.8 C) 97.9 F (36.6 C)  TempSrc: Oral  Oral Axillary  SpO2: 94% 94% 92% 93%  Weight:      Height:        Intake/Output Summary (Last 24 hours) at 03/18/2021 1316 Last data filed at 03/18/2021 0600 Gross per 24 hour  Intake 498.57 ml  Output --  Net 498.57 ml    Filed Weights   03/17/21 0015  Weight: 39.8 kg    Examination:  Constitutional: She is in no distress Eyes: Anicteric ENMT: Moist mucous membranes Neck: normal, supple Respiratory: Clear bilaterally, no  wheezing, no crackles Cardiovascular: Regular rate and rhythm, no murmurs, no peripheral edema Abdomen: Soft, NT, ND, bowel sounds positive Musculoskeletal: no clubbing / cyanosis.  Skin: No rashes appreciated Neurologic: Nonfocal   Data Reviewed: I have independently reviewed following labs and imaging studies   CBC: Recent Labs  Lab 03/14/21 1353 03/15/21 0555 03/16/21 0332 03/17/21 0324 03/18/21 0210  WBC 1.5* 2.8* 4.1 6.9 13.1*  NEUTROABS 1.2*  --  2.5  --  5.9  HGB 9.8* 9.7* 7.4* 7.4* 7.6*  HCT 28.3* 27.5* 21.1* 20.7* 21.1*  MCV 84.0 82.3 84.7 82.5 82.7  PLT 7* 6* 33* 31* 49*    Basic Metabolic Panel: Recent Labs  Lab 03/14/21 1353 03/15/21 0023 03/15/21 0555 03/16/21 0332 03/17/21 0324 03/18/21 0210  NA 138  --  132* 135 134* 133*  K 3.3*  --  2.8* 3.4* 3.0* 4.0  CL 100  --  95* 103 99 99  CO2 28  --  26 25 27 28   GLUCOSE 96  --  81 90 111* 124*  BUN 18  --  16 19 18 14   CREATININE <0.30*  --  0.49 0.48 0.43* 0.32*  CALCIUM 8.4*  --  7.7* 7.7* 7.9* 8.0*  MG  --  1.7  --   --   --  1.8  PHOS  --   --   --   --   --  1.6*    Liver Function Tests: Recent Labs  Lab 03/14/21 1353 03/15/21 0555 03/16/21 0332 03/18/21 0210  AST 15 16 12* 24  ALT 22 23 19 20   ALKPHOS 71 70 53 68  BILITOT 0.8 1.3* 1.0 0.7  PROT 5.9* 5.3* 4.5* 4.7*  ALBUMIN 2.6* 2.2* 1.9* 2.0*    Coagulation Profile: No results for input(s): INR, PROTIME in the last 168 hours. HbA1C: No results for input(s): HGBA1C in the last 72 hours. CBG: No results for input(s): GLUCAP in the last 168 hours.  Recent Results (from the past 240 hour(s))  Culture, blood (routine x 2)     Status: Abnormal   Collection Time: 03/14/21  1:43 PM   Specimen: Left Antecubital; Blood  Result Value Ref Range Status   Specimen Description   Final    LEFT ANTECUBITAL Performed at [facility] [address], [place], [place] [address]    Special Requests   Final    BOTTLES DRAWN  AEROBIC AND ANAEROBIC Blood Culture adequate volume Performed at [facility] [address] [place], [place] [address]    Culture  Setup Time   Final    GRAM NEGATIVE RODS IN BOTH AEROBIC AND ANAEROBIC BOTTLES CRITICAL VALUE NOTED.  VALUE IS CONSISTENT WITH PREVIOUSLY REPORTED AND CALLED VALUE. Performed at [facility], [place] [address] [place],  [address]    Culture ESCHERICHIA COLI PROTEUS MIRABILIS  (A)  Final   Report Status 03/17/2021 FINAL  Final   Organism ID, Bacteria ESCHERICHIA COLI  Final   Organism ID, Bacteria PROTEUS MIRABILIS  Final      Susceptibility   Escherichia coli - MIC*    AMPICILLIN >=32 RESISTANT Resistant     CEFAZOLIN 8 SENSITIVE Sensitive     CEFEPIME <=0.12 SENSITIVE Sensitive     CEFTAZIDIME <=1 SENSITIVE Sensitive     CEFTRIAXONE <=0.25 SENSITIVE Sensitive     CIPROFLOXACIN <=0.25 SENSITIVE Sensitive     GENTAMICIN <=1 SENSITIVE Sensitive     IMIPENEM <=0.25 SENSITIVE Sensitive     TRIMETH/SULFA <=20 SENSITIVE Sensitive     AMPICILLIN/SULBACTAM >=32 RESISTANT Resistant     PIP/TAZO <=4 SENSITIVE Sensitive     * ESCHERICHIA COLI   Proteus mirabilis - MIC*    AMPICILLIN <=2 SENSITIVE Sensitive     CEFAZOLIN <=4 SENSITIVE Sensitive     CEFEPIME <=0.12 SENSITIVE Sensitive     CEFTAZIDIME <=1 SENSITIVE Sensitive     CEFTRIAXONE <=0.25 SENSITIVE Sensitive     CIPROFLOXACIN <=0.25 SENSITIVE Sensitive     GENTAMICIN <=1 SENSITIVE Sensitive     IMIPENEM 2 SENSITIVE Sensitive     TRIMETH/SULFA <=20 SENSITIVE Sensitive     AMPICILLIN/SULBACTAM <=2 SENSITIVE Sensitive     PIP/TAZO <=4 SENSITIVE Sensitive     * PROTEUS MIRABILIS  Culture, blood (routine x 2)     Status: Abnormal   Collection Time: 03/14/21  1:53 PM   Specimen: Right Antecubital; Blood  Result Value Ref Range Status   Specimen Description   Final    RIGHT ANTECUBITAL Performed at [facility], [place] [address] [place], [place]  [address]    Special Requests   Final    BOTTLES DRAWN AEROBIC AND ANAEROBIC Blood Culture adequate volume Performed at [facility] [address], [place], [place] [address]    Culture  Setup Time   Final    GRAM NEGATIVE RODS IN BOTH AEROBIC AND ANAEROBIC BOTTLES CRITICAL RESULT CALLED TO, READ BACK BY AND VERIFIED WITH: V BRYK,PHARMD@0723  03/15/21 [place]    Culture (A)  Final    PROTEUS MIRABILIS ESCHERICHIA COLI SUSCEPTIBILITIES PERFORMED ON PREVIOUS CULTURE WITHIN THE LAST 5 DAYS. Performed at [facility], [place] [address], [place], [place] [address]    Report Status 03/17/2021 FINAL  Final  Blood Culture ID Panel (Reflexed)     Status: Abnormal   Collection Time: 03/14/21  1:53 PM  Result Value Ref Range Status   Enterococcus faecalis NOT DETECTED NOT DETECTED Final   Enterococcus Faecium NOT DETECTED NOT DETECTED Final   Listeria monocytogenes NOT DETECTED NOT DETECTED Final   Staphylococcus species NOT DETECTED NOT DETECTED Final   Staphylococcus aureus (BCID) NOT DETECTED NOT DETECTED Final   Staphylococcus epidermidis NOT DETECTED NOT DETECTED Final   Staphylococcus lugdunensis NOT DETECTED NOT DETECTED Final   Streptococcus species NOT DETECTED NOT DETECTED Final   Streptococcus agalactiae NOT DETECTED NOT DETECTED Final   Streptococcus pneumoniae NOT DETECTED NOT DETECTED Final   Streptococcus pyogenes NOT DETECTED NOT DETECTED Final   A.calcoaceticus-baumannii NOT DETECTED NOT DETECTED Final   Bacteroides fragilis NOT DETECTED NOT DETECTED Final   Enterobacterales DETECTED (A) NOT DETECTED Final    Comment: CRITICAL RESULT CALLED TO, READ BACK BY AND VERIFIED WITH: V BRYK,PHARMD@0723  03/15/21 [place]    Enterobacter cloacae complex NOT DETECTED NOT DETECTED Final   Escherichia coli DETECTED (A) NOT DETECTED Final    Comment: CRITICAL RESULT CALLED TO, READ BACK BY AND VERIFIED WITH: V BRYK,PHARMD@0723  03/15/21 [place]    Klebsiella aerogenes NOT  DETECTED NOT DETECTED Final   Klebsiella oxytoca NOT DETECTED NOT DETECTED Final   Klebsiella pneumoniae NOT DETECTED NOT DETECTED Final   Proteus species DETECTED (A) NOT DETECTED Final    Comment: CRITICAL RESULT CALLED TO, READ BACK BY AND VERIFIED WITH: V BRYK,PHARMD@0723  03/15/21 [place]    Salmonella species NOT DETECTED NOT DETECTED Final   Serratia marcescens NOT DETECTED NOT DETECTED Final   Haemophilus influenzae NOT DETECTED NOT DETECTED Final   Neisseria meningitidis NOT DETECTED NOT DETECTED Final   Pseudomonas aeruginosa NOT DETECTED NOT DETECTED Final   Stenotrophomonas maltophilia NOT DETECTED NOT DETECTED Final   Candida albicans NOT DETECTED NOT DETECTED Final   Candida auris NOT DETECTED NOT DETECTED Final   Candida glabrata NOT DETECTED NOT DETECTED Final   Candida krusei NOT DETECTED NOT DETECTED Final   Candida parapsilosis NOT DETECTED NOT DETECTED Final   Candida tropicalis NOT DETECTED NOT DETECTED Final   Cryptococcus neoformans/gattii NOT DETECTED NOT DETECTED Final   CTX-M ESBL NOT DETECTED NOT DETECTED Final   Carbapenem resistance IMP NOT DETECTED NOT DETECTED Final   Carbapenem resistance KPC NOT DETECTED NOT DETECTED Final   Carbapenem resistance NDM NOT DETECTED NOT DETECTED Final   Carbapenem resist OXA 48 LIKE NOT DETECTED NOT DETECTED Final   Carbapenem resistance VIM NOT DETECTED NOT DETECTED Final    Comment: Performed at [facility], [address] [place], [place] [address]  Resp Panel by RT-PCR (Flu A&B, Covid) Nasopharyngeal Swab     Status: None  Collection Time: 03/14/21  1:54 PM   Specimen: Nasopharyngeal Swab; Nasopharyngeal(NP) swabs in vial transport medium  Result Value Ref Range Status   SARS Coronavirus 2 by RT PCR NEGATIVE NEGATIVE Final    Comment: (NOTE) SARS-CoV-2 target nucleic acids are NOT DETECTED.  The SARS-CoV-2 RNA is generally detectable in upper respiratory specimens during the acute phase of infection. The  lowest concentration of SARS-CoV-2 viral copies this assay can detect is 138 copies/mL. A negative result does not preclude SARS-Cov-2 infection and should not be used as the sole basis for treatment or other patient management decisions. A negative result may occur with  improper specimen collection/handling, submission of specimen other than nasopharyngeal swab, presence of viral mutation(s) within the areas targeted by this assay, and inadequate number of viral copies(<138 copies/mL). A negative result must be combined with clinical observations, patient history, and epidemiological information. The expected result is Negative.  Fact Sheet for Patients:  EntrepreneurPulse.com.au  Fact Sheet for Healthcare Providers:  IncredibleEmployment.be  This test is no t yet approved or cleared by the [place] FDA and  has been authorized for detection and/or diagnosis of SARS-CoV-2 by FDA under an Emergency Use Authorization (EUA). This EUA will remain  in effect (meaning this test can be used) for the duration of the COVID-19 declaration under Section 564(b)(1) of the Act, 21 U.S.C.section 360bbb-3(b)(1), unless the authorization is terminated  or revoked sooner.       Influenza A by PCR NEGATIVE NEGATIVE Final   Influenza B by PCR NEGATIVE NEGATIVE Final    Comment: (NOTE) The Xpert Xpress SARS-CoV-2/FLU/RSV plus assay is intended as an aid in the diagnosis of influenza from Nasopharyngeal swab specimens and should not be used as a sole basis for treatment. Nasal washings and aspirates are unacceptable for Xpert Xpress SARS-CoV-2/FLU/RSV testing.  Fact Sheet for Patients: EntrepreneurPulse.com.au  Fact Sheet for Healthcare Providers: IncredibleEmployment.be  This test is not yet approved or cleared by the [place] FDA and has been authorized for detection and/or diagnosis of SARS-CoV-2 by FDA under  an Emergency Use Authorization (EUA). This EUA will remain in effect (meaning this test can be used) for the duration of the COVID-19 declaration under Section 564(b)(1) of the Act, 21 U.S.C. section 360bbb-3(b)(1), unless the authorization is terminated or revoked.  Performed at [facility], [place] [address], [place], [place] [address]       Radiology Studies: No results found.  Marzetta Board, MD, PhD [facility]  Between 7 am - 7 pm I am available, please contact me via Amion (for emergencies) or Securechat (non urgent messages)  Between 7 pm - 7 am I am not available, please contact night coverage MD/APP via Amion

## 2021-03-18 NOTE — ED Provider Notes (Signed)
[facility] PROGRESSIVE CARE Provider Note   CSN: [id number] Arrival date & time: 03/14/21  1312     History Chief Complaint  Patient presents with   Weakness   Hematuria    Loretta Woods is a 65 y.o. female.  Patient with history of metastatic cancer receiving chemotherapy, presents with confusion ongoing for the past 24 hours.  No additional reports of fall or trauma.  No reports of headache or chest pain or abdominal pain.      Past Medical History:  Diagnosis Date   Hypertension    Thyroid disease     Patient Active Problem List   Diagnosis Date Noted   Acute metabolic encephalopathy 03/47/4259   Adenocarcinoma of left lung, stage 4 ([place]) 03/08/2021   Malignant neoplasm of lower lobe of left lung ([place]) 02/12/2021   Brain metastases ([place]) 02/02/2021   Endobronchial cancer, left ([facility])    Loculated pleural effusion    Brain mass 01/17/2021   Lung mass 01/17/2021   Hypertensive urgency 01/17/2021   Hyponatremia 01/17/2021   Hypokalemia 01/17/2021    Past Surgical History:  Procedure Laterality Date   CRYOTHERAPY  01/19/2021   Procedure: CRYOTHERAPY;  Surgeon: Garner Nash, DO;  Location: [facility];  Service: Pulmonary;;   HEMOSTASIS CONTROL  01/19/2021   Procedure: HEMOSTASIS CONTROL;  Surgeon: Garner Nash, DO;  Location: [facility];  Service: Pulmonary;;   IR IMAGING GUIDED PORT INSERTION  02/27/2021   VIDEO BRONCHOSCOPY  01/19/2021   Procedure: VIDEO BRONCHOSCOPY WITHOUT FLUORO;  Surgeon: Garner Nash, DO;  Location: [facility];  Service: Pulmonary;;     OB History   No obstetric history on file.     Family History  Problem Relation Age of Onset   Bladder Cancer Father     Social History   Tobacco Use   Smoking status: Former    Packs/day: 1.50    Years: 37.00    Pack years: 55.50    Types: Cigarettes    Start date: 07/15/1973    Quit date: 2012    Years since quitting: 10.6   Smokeless tobacco: Never  Vaping Use   Vaping Use:  Every day  Substance Use Topics   Alcohol use: Never   Drug use: Never    Home Medications Prior to Admission medications   Medication Sig Start Date End Date Taking? Authorizing Provider  acetaminophen (TYLENOL) 500 MG tablet Take 500 mg by mouth every 6 (six) hours as needed for moderate pain.   Yes [provider]  atorvastatin (LIPITOR) 10 MG tablet Take 10 mg by mouth daily. 10/28/20  Yes [provider]  citalopram (CELEXA) 20 MG tablet Take 1 tablet (20 mg total) by mouth daily. 03/05/21  Yes Orson Slick, MD  dexamethasone (DECADRON) 2 MG tablet Take 2 tablets TID. On 8/15 taper to 2 tabs BID. On 9/15 taper to 2 tabs daily. On 9/30 taper to 1 tab daily.  On 10/7 taper to one tab QOD. Last dose: 10/15. Take with food. Patient taking differently: Take 4 mg by mouth 2 (two) times daily. Take 2 tablets TID. On 8/15 taper to 2 tabs BID. On 9/15 taper to 2 tabs daily. On 9/30 taper to 1 tab daily.  On 10/7 taper to one tab QOD. Last dose: 10/15. Take with food. 02/19/21  Yes Eppie Gibson, MD  EUTHYROX 75 MCG tablet Take 75 mcg by mouth daily. 10/28/20  Yes [provider]  folic acid (FOLVITE) 1  MG tablet Take 1 tablet (1 mg total) by mouth daily. 02/12/21  Yes Orson Slick, MD  hydrochlorothiazide (HYDRODIURIL) 50 MG tablet Take 25 mg by mouth daily. 11/30/20  Yes [provider]  levETIRAcetam (KEPPRA) 500 MG tablet Take 1 tablet (500 mg total) by mouth 2 (two) times daily. Patient taking differently: Take 500 mg by mouth 2 (two) times daily as needed (seizures). 01/19/21  Yes Patrecia Pour, MD  lidocaine-prilocaine (EMLA) cream Apply 1 application topically as needed. Patient taking differently: Apply 1 application topically as needed (access port). 02/12/21  Yes Orson Slick, MD  Multiple Vitamins-Minerals (EMERGEN-C IMMUNE) PACK Take 1 packet by mouth daily as needed (immune support).   Yes [provider]  ondansetron (ZOFRAN) 8 MG tablet  Take 1 tablet (8 mg total) by mouth every 8 (eight) hours as needed. Patient taking differently: Take 8 mg by mouth every 8 (eight) hours as needed for nausea or vomiting. 02/12/21  Yes Orson Slick, MD  potassium chloride SA (KLOR-CON) 20 MEQ tablet Take 1 tablet (20 mEq total) by mouth daily. 03/05/21  Yes Orson Slick, MD  prochlorperazine (COMPAZINE) 10 MG tablet Take 1 tablet (10 mg total) by mouth every 6 (six) hours as needed for nausea or vomiting. 02/12/21  Yes Orson Slick, MD  fluconazole (DIFLUCAN) 100 MG tablet Hold Atorvastatin while on this medication. Take 2 tablets today, then 1 tablet daily x 20 more days. Patient not taking: No sig reported 02/07/21   Eppie Gibson, MD  temazepam (RESTORIL) 22.5 MG capsule Take 1 capsule (22.5 mg total) by mouth at bedtime as needed for sleep. Patient not taking: No sig reported 02/02/21   Eppie Gibson, MD    Allergies    Patient has no known allergies.  Review of Systems   Review of Systems  Unable to perform ROS: Mental status change   Physical Exam Updated Vital Signs BP (!) 152/87 (BP Location: Left Arm)   Pulse 98   Temp 98.2 F (36.8 C) (Oral)   Resp 19   Ht 5\' 2"  (1.575 m)   Wt 39.8 kg   SpO2 92%   BMI 16.06 kg/m   Physical Exam Constitutional:      General: She is not in acute distress.    Comments: Patient is awake and alert, appears confused and answers to questions do not make sense.  HENT:     Head: Normocephalic.     Nose: Nose normal.  Eyes:     Extraocular Movements: Extraocular movements intact.  Cardiovascular:     Rate and Rhythm: Normal rate.  Pulmonary:     Effort: Pulmonary effort is normal.  Musculoskeletal:        General: Normal range of motion.     Cervical back: Normal range of motion.  Neurological:     Comments: Appears to be moving all extremities.  But appears confused.    ED Results / Procedures / Treatments   Labs (all labs ordered are listed, but only abnormal results are  displayed) Labs Reviewed  CULTURE, BLOOD (ROUTINE X 2) - Abnormal; Notable for the following components:      Result Value   Culture   (*)    Value: PROTEUS MIRABILIS ESCHERICHIA COLI SUSCEPTIBILITIES PERFORMED ON PREVIOUS CULTURE WITHIN THE LAST 5 DAYS. Performed at [facility], [place] [address], [place], [place] [address]    All other components within normal limits  CULTURE, BLOOD (  ROUTINE X 2) - Abnormal; Notable for the following components:   Culture   (*)    Value: ESCHERICHIA COLI PROTEUS MIRABILIS    All other components within normal limits  BLOOD CULTURE ID PANEL (REFLEXED) - BCID2 - Abnormal; Notable for the following components:   Enterobacterales DETECTED (*)    Escherichia coli DETECTED (*)    Proteus species DETECTED (*)    All other components within normal limits  CBC WITH DIFFERENTIAL/PLATELET - Abnormal; Notable for the following components:   WBC 1.5 (*)    RBC 3.37 (*)    Hemoglobin 9.8 (*)    HCT 28.3 (*)    Platelets 7 (*)    Neutro Abs 1.2 (*)    Lymphs Abs 0.1 (*)    All other components within normal limits  COMPREHENSIVE METABOLIC PANEL - Abnormal; Notable for the following components:   Potassium 3.3 (*)    Creatinine, Ser <0.30 (*)    Calcium 8.4 (*)    Total Protein 5.9 (*)    Albumin 2.6 (*)    All other components within normal limits  BRAIN NATRIURETIC PEPTIDE - Abnormal; Notable for the following components:   B Natriuretic Peptide 271.7 (*)    All other components within normal limits  AMMONIA - Abnormal; Notable for the following components:   Ammonia 37 (*)    All other components within normal limits  COMPREHENSIVE METABOLIC PANEL - Abnormal; Notable for the following components:   Sodium 132 (*)    Potassium 2.8 (*)    Chloride 95 (*)    Calcium 7.7 (*)    Total Protein 5.3 (*)    Albumin 2.2 (*)    Total Bilirubin 1.3 (*)    All other components within normal limits  CBC - Abnormal; Notable for the following  components:   WBC 2.8 (*)    RBC 3.34 (*)    Hemoglobin 9.7 (*)    HCT 27.5 (*)    Platelets 6 (*)    All other components within normal limits  IRON AND TIBC - Abnormal; Notable for the following components:   TIBC 186 (*)    Saturation Ratios 32 (*)    All other components within normal limits  LACTATE DEHYDROGENASE - Abnormal; Notable for the following components:   LDH 268 (*)    All other components within normal limits  HAPTOGLOBIN - Abnormal; Notable for the following components:   Haptoglobin 559 (*)    All other components within normal limits  TSH - Abnormal; Notable for the following components:   TSH 7.008 (*)    All other components within normal limits  VITAMIN B12 - Abnormal; Notable for the following components:   Vitamin B-12 1,392 (*)    All other components within normal limits  COMPREHENSIVE METABOLIC PANEL - Abnormal; Notable for the following components:   Potassium 3.4 (*)    Calcium 7.7 (*)    Total Protein 4.5 (*)    Albumin 1.9 (*)    AST 12 (*)    All other components within normal limits  CBC WITH DIFFERENTIAL/PLATELET - Abnormal; Notable for the following components:   RBC 2.49 (*)    Hemoglobin 7.4 (*)    HCT 21.1 (*)    Platelets 33 (*)    Lymphs Abs 0.6 (*)    Abs Immature Granulocytes 0.59 (*)    All other components within normal limits  CBC - Abnormal; Notable for the following components:   RBC 2.51 (*)  Hemoglobin 7.4 (*)    HCT 20.7 (*)    Platelets 31 (*)    nRBC 0.3 (*)    All other components within normal limits  BASIC METABOLIC PANEL - Abnormal; Notable for the following components:   Sodium 134 (*)    Potassium 3.0 (*)    Glucose, Bld 111 (*)    Creatinine, Ser 0.43 (*)    Calcium 7.9 (*)    All other components within normal limits  COMPREHENSIVE METABOLIC PANEL - Abnormal; Notable for the following components:   Sodium 133 (*)    Glucose, Bld 124 (*)    Creatinine, Ser 0.32 (*)    Calcium 8.0 (*)    Total Protein  4.7 (*)    Albumin 2.0 (*)    All other components within normal limits  CBC WITH DIFFERENTIAL/PLATELET - Abnormal; Notable for the following components:   WBC 13.1 (*)    RBC 2.55 (*)    Hemoglobin 7.6 (*)    HCT 21.1 (*)    Platelets 49 (*)    nRBC 0.8 (*)    Abs Immature Granulocytes 5.39 (*)    All other components within normal limits  PHOSPHORUS - Abnormal; Notable for the following components:   Phosphorus 1.6 (*)    All other components within normal limits  TROPONIN I (HIGH SENSITIVITY) - Abnormal; Notable for the following components:   Troponin I (High Sensitivity) 21 (*)    All other components within normal limits  TROPONIN I (HIGH SENSITIVITY) - Abnormal; Notable for the following components:   Troponin I (High Sensitivity) 32 (*)    All other components within normal limits  RESP PANEL BY RT-PCR (FLU A&B, COVID) ARPGX2  LACTIC ACID, PLASMA  LACTIC ACID, PLASMA  MAGNESIUM  AMMONIA  FOLATE  VITAMIN B1  MAGNESIUM  URINALYSIS, ROUTINE W REFLEX MICROSCOPIC  URINALYSIS, ROUTINE W REFLEX MICROSCOPIC  MAGNESIUM  PATHOLOGIST SMEAR REVIEW  ABO/RH  ABO/RH  PREPARE PLATELET PHERESIS  PREPARE PLATELET PHERESIS    EKG None  Radiology No results found.  Procedures .Critical Care  Date/Time: 03/18/2021 10:23 AM Performed by: Luna Fuse, MD Authorized by: Luna Fuse, MD   Critical care provider statement:    Critical care time (minutes):  30   Critical care time was exclusive of:  Separately billable procedures and treating other patients   Critical care was necessary to treat or prevent imminent or life-threatening deterioration of the following conditions:  Circulatory failure and CNS failure or compromise Comments:     Worsening brain lesion, pancytopenic requiring emergent transfusion of platelets.   Medications Ordered in ED Medications  atorvastatin (LIPITOR) tablet 10 mg (10 mg Oral Given 03/18/21 1018)  hydrochlorothiazide (HYDRODIURIL) tablet  25 mg (25 mg Oral Given 03/18/21 1018)  prochlorperazine (COMPAZINE) tablet 10 mg (has no administration in time range)  dexamethasone (DECADRON) tablet 4 mg (4 mg Oral Given 03/18/21 1018)  levothyroxine (SYNTHROID) tablet 75 mcg (75 mcg Oral Given 12/17/76 4696)  folic acid (FOLVITE) tablet 1 mg (1 mg Oral Given 03/18/21 1018)  acetaminophen (TYLENOL) tablet 650 mg (650 mg Oral Given 03/17/21 0539)    Or  acetaminophen (TYLENOL) suppository 650 mg ( Rectal See Alternative 03/17/21 0539)  ondansetron (ZOFRAN) tablet 4 mg ( Oral See Alternative 03/16/21 1243)    Or  ondansetron (ZOFRAN) injection 4 mg (4 mg Intravenous Given 03/16/21 1243)  metoprolol tartrate (LOPRESSOR) injection 5 mg (5 mg Intravenous Given 03/14/21 2042)  thiamine (B-1) injection 100 mg (  Intravenous See Alternative 03/18/21 1018)    Or  thiamine tablet 100 mg (100 mg Oral Given 03/18/21 1018)  Chlorhexidine Gluconate Cloth 2 % PADS 6 each (6 each Topical Given 03/17/21 1100)  cefTRIAXone (ROCEPHIN) 2 g in sodium chloride 0.9 % 100 mL IVPB (2 g Intravenous New Bag/Given 03/18/21 0831)  sodium chloride flush (NS) 0.9 % injection 10-40 mL (10 mLs Intracatheter Given 03/18/21 1019)  sodium chloride flush (NS) 0.9 % injection 10-40 mL (has no administration in time range)  0.9 %  sodium chloride infusion (0 mLs Intravenous Stopped 03/16/21 1623)  Gerhardt's butt cream ( Topical Given 03/18/21 1019)  feeding supplement (ENSURE ENLIVE / ENSURE PLUS) liquid 237 mL (237 mLs Oral Given 03/17/21 1430)  multivitamin with minerals tablet 1 tablet (1 tablet Oral Given 03/18/21 1019)  potassium chloride (KLOR-CON) packet 40 mEq (40 mEq Oral Given 03/18/21 1018)  levETIRAcetam (KEPPRA) 100 MG/ML solution 500 mg (500 mg Oral Given 03/18/21 1019)  MEDLINE mouth rinse (15 mLs Mouth Rinse Given 03/18/21 1019)  sodium phosphate 10 mmol in dextrose 5 % 250 mL infusion ( Intravenous Infusion Verify 03/18/21 0600)  sodium chloride 0.9 % bolus 1,000 mL (0 mLs Intravenous Stopped  03/14/21 1537)  0.9 %  sodium chloride infusion (0 mL/hr Intravenous Stopped 03/14/21 2238)  Tbo-Filgrastim (GRANIX) injection 300 mcg (300 mcg Subcutaneous Given 03/14/21 2046)  gadobutrol (GADAVIST) 1 MMOL/ML injection 4 mL (4 mLs Intravenous Contrast Given 03/14/21 1738)  potassium chloride SA (KLOR-CON) CR tablet 40 mEq (40 mEq Oral Given 03/15/21 0821)  0.9 %  sodium chloride infusion (Manually program via Guardrails IV Fluids) ( Intravenous New Bag/Given 03/15/21 0845)  potassium chloride (KLOR-CON) packet 40 mEq (40 mEq Oral Given 03/17/21 0532)  magnesium sulfate IVPB 1 g 100 mL (0 g Intravenous Stopped 03/18/21 0547)    ED Course  I have reviewed the triage vital signs and the nursing notes.  Pertinent labs & imaging results that were available during my care of the patient were reviewed by me and considered in my medical decision making (see chart for details).    MDM Rules/Calculators/A&P                           CT imaging shows brain lesions with no midline shift.  Platelets and white count are severely low, transfused unit of platelets here in the ER.  Patient otherwise afebrile.  Will be admitted to the hospitalist team.  Final Clinical Impression(s) / ED Diagnoses Final diagnoses:  Other pancytopenia [facility])    Rx / DC Orders ED Discharge Orders     None        Luna Fuse, MD 03/18/21 1024

## 2021-03-18 NOTE — Plan of Care (Signed)
  Problem: Education: Goal: Knowledge of disease or condition will improve Outcome: Progressing Goal: Knowledge of secondary prevention will improve Outcome: Progressing Goal: Knowledge of patient specific risk factors addressed and post discharge goals established will improve Outcome: Progressing   Problem: Coping: Goal: Will verbalize positive feelings about self Outcome: Progressing Goal: Will identify appropriate support needs Outcome: Progressing   Problem: Health Behavior/Discharge Planning: Goal: Ability to manage health-related needs will improve Outcome: Progressing   Problem: Self-Care: Goal: Ability to participate in self-care as condition permits will improve Outcome: Progressing Goal: Verbalization of feelings and concerns over difficulty with self-care will improve Outcome: Progressing Goal: Ability to communicate needs accurately will improve Outcome: Progressing   Problem: Nutrition: Goal: Risk of aspiration will decrease Outcome: Progressing Goal: Dietary intake will improve Outcome: Progressing   Problem: Intracerebral Hemorrhage Tissue Perfusion: Goal: Complications of Intracerebral Hemorrhage will be minimized Outcome: Progressing   Problem: Ischemic Stroke/TIA Tissue Perfusion: Goal: Complications of ischemic stroke/TIA will be minimized Outcome: Progressing   Problem: Spontaneous Subarachnoid Hemorrhage Tissue Perfusion: Goal: Complications of Spontaneous Subarachnoid Hemorrhage will be minimized Outcome: Progressing   Problem: Education: Goal: Knowledge of General Education information will improve Description: Including pain rating scale, medication(s)/side effects and non-pharmacologic comfort measures Outcome: Progressing   Problem: Health Behavior/Discharge Planning: Goal: Ability to manage health-related needs will improve Outcome: Progressing   Problem: Clinical Measurements: Goal: Ability to maintain clinical measurements within  normal limits will improve Outcome: Progressing Goal: Will remain free from infection Outcome: Progressing Goal: Diagnostic test results will improve Outcome: Progressing Goal: Respiratory complications will improve Outcome: Progressing Goal: Cardiovascular complication will be avoided Outcome: Progressing   Problem: Activity: Goal: Risk for activity intolerance will decrease Outcome: Progressing   Problem: Nutrition: Goal: Adequate nutrition will be maintained Outcome: Progressing   Problem: Coping: Goal: Level of anxiety will decrease Outcome: Progressing   Problem: Elimination: Goal: Will not experience complications related to bowel motility Outcome: Progressing Goal: Will not experience complications related to urinary retention Outcome: Progressing   Problem: Pain Managment: Goal: General experience of comfort will improve Outcome: Progressing   Problem: Safety: Goal: Ability to remain free from injury will improve Outcome: Progressing   Problem: Skin Integrity: Goal: Risk for impaired skin integrity will decrease Outcome: Progressing

## 2021-03-19 DIAGNOSIS — R7881 Bacteremia: Secondary | ICD-10-CM | POA: Diagnosis not present

## 2021-03-19 DIAGNOSIS — G9341 Metabolic encephalopathy: Secondary | ICD-10-CM | POA: Diagnosis not present

## 2021-03-19 LAB — CBC
HCT: 22.3 % — ABNORMAL LOW (ref 36.0–46.0)
Hemoglobin: 8 g/dL — ABNORMAL LOW (ref 12.0–15.0)
MCH: 29.6 pg (ref 26.0–34.0)
MCHC: 35.9 g/dL (ref 30.0–36.0)
MCV: 82.6 fL (ref 80.0–100.0)
Platelets: 80 10*3/uL — ABNORMAL LOW (ref 150–400)
RBC: 2.7 MIL/uL — ABNORMAL LOW (ref 3.87–5.11)
RDW: 12.3 % (ref 11.5–15.5)
WBC: 17.2 10*3/uL — ABNORMAL HIGH (ref 4.0–10.5)
nRBC: 1.1 % — ABNORMAL HIGH (ref 0.0–0.2)

## 2021-03-19 LAB — BASIC METABOLIC PANEL
Anion gap: 12 (ref 5–15)
BUN: 9 mg/dL (ref 8–23)
CO2: 26 mmol/L (ref 22–32)
Calcium: 8.1 mg/dL — ABNORMAL LOW (ref 8.9–10.3)
Chloride: 91 mmol/L — ABNORMAL LOW (ref 98–111)
Creatinine, Ser: <0.3 mg/dL — ABNORMAL LOW (ref 0.44–1.00)
Glucose, Bld: 90 mg/dL (ref 70–99)
Potassium: 3.5 mmol/L (ref 3.5–5.1)
Sodium: 129 mmol/L — ABNORMAL LOW (ref 135–145)

## 2021-03-19 NOTE — TOC Progression Note (Signed)
Transition of Care [facility]) - Progression Note    Patient Details  Name: Loretta Woods MRN: [id number] Date of Birth: March 20, 1956  Transition of Care [facility]) CM/SW [place], [place] Phone Number: 03/19/2021, 10:35 AM  Clinical Narrative:   CSW following for SNF placement. Patient has no bed offers, was not faxed to SNF options that are in network with patient's insurance. CSW faxed referral to in-network SNF and awaiting responses.     Expected Discharge Plan: Skilled Nursing Facility Barriers to Discharge: Ship broker, Continued Medical Work up, SNF Pending bed offer  Expected Discharge Plan and Services Expected Discharge Plan: [place] Choice: [place] arrangements for the past 2 months: Single Family Home                                       Social Determinants of Health (SDOH) Interventions    Readmission Risk Interventions No flowsheet data found.

## 2021-03-19 NOTE — Progress Notes (Signed)
PROGRESS NOTE  Loretta Woods GMW:[id number] DOB: 1956/05/23 DOA: 03/14/2021 PCP: Leonard Downing, MD   LOS: 5 days   Brief Narrative / Interim history: 65 year old female with history of stage IV lung cancer with brain metastasis, HTN, HLD, hypothyroidism, seizures came into the hospital with altered mental status.  Patient apparently completed brain radiation couple of weeks ago and has been placed on chemotherapy 10 days ago.  Over the last couple of weeks she is becoming progressively weaker.  She was also occasionally confused and has been having a poor appetite in the last week.  Subjective / 24h Interval events: Eating breakfast, without complaints  Assessment & Plan: Principal Problem Acute metabolic encephalopathy-likely multifactorial, possibly related to the brain mets as well as bacteremia.  An MRI of the brain on admission showed multiple metastatic deposits stable since 02/06/2021, the largest lesion in the right temporal lobe has a significant dural component likely due to a dural metastasis while inpatient with surrounding edema and mass-effect. -For now continue Decadron to minimize swelling, continue Keppra, supportive care -Neurology consulted as well, signed off 9/1  Active Problems E. Coli and Proteus bacteremia-blood cultures were positive for gram-negative rods, BC ID showing E. coli as well as Proteus species -She is currently on ceftriaxone, continue, sensitivities show resistance to ampicillin and Augmentin for the E. coli but otherwise sensitive. -ID consulted, plans to treat for total of 10 days with end date Saturday 9/10  Stage IV adenocarcinoma of the lung-status post whole brain radiation but beginning of August and started chemotherapy with Dr. Lorenso Courier 8/22.   Deconditioning, weakness-PT recommends SNF, patient agreeable, placement pending  Pancytopenia, chemotherapy-induced-she received Granix on admission, hold further doses, white count now  increasing -Received a unit of platelets 9/1 for platelets of 6, responded well, currently platelets increasing on their own -Hemoglobin seems to recover as well  Hypothyroidism-TSH 7.0, no need to adjust her current Synthroid dose, not high enough to explain her lethargy and may be in the setting of acute illness.  Monitor as an outpatient and repeat TSH in 3 to 4 weeks  Seizure disorder-continue Keppra  Hypokalemia-due to poor p.o. intake, continue to replete  Hypertension-continue home regimen  Hyperlipidemia -continue statin  Goals of Care - appropriate to consult Palliative  Pressure injury, POA Pressure Injury 03/15/21 Buttocks Right;Left Unstageable - Full thickness tissue loss in which the base of the injury is covered by slough (yellow, tan, gray, green or brown) and/or eschar (tan, brown or black) in the wound bed. moisture associated skin  (Active)  03/15/21   Location: Buttocks  Location Orientation: Right;Left  Staging: Unstageable - Full thickness tissue loss in which the base of the injury is covered by slough (yellow, tan, gray, green or brown) and/or eschar (tan, brown or black) in the wound bed.  Wound Description (Comments): moisture associated skin damage to bilat buttocks with patchy areas of Unstageable pressure injuries  Present on Admission: Yes   Scheduled Meds:  atorvastatin  10 mg Oral Daily   Chlorhexidine Gluconate Cloth  6 each Topical Daily   dexamethasone  4 mg Oral BID   feeding supplement  237 mL Oral BID BM   folic acid  1 mg Oral Daily   Gerhardt's butt cream   Topical QID   hydrochlorothiazide  25 mg Oral Daily   levETIRAcetam  500 mg Oral BID   levothyroxine  75 mcg Oral Q0600   mouth rinse  15 mL Mouth Rinse BID   multivitamin with minerals  1 tablet Oral Daily   potassium chloride  40 mEq Oral Daily   sodium chloride flush  10-40 mL Intracatheter Q12H   thiamine injection  100 mg Intravenous Daily   Or   thiamine  100 mg Oral Daily    Continuous Infusions:  cefTRIAXone (ROCEPHIN)  IV 2 g (03/19/21 0942)   PRN Meds:.acetaminophen **OR** acetaminophen, metoprolol tartrate, ondansetron **OR** ondansetron (ZOFRAN) IV, prochlorperazine, sodium chloride flush  Diet Orders (From admission, onward)     Start     Ordered   03/14/21 2239  Diet Heart Room service appropriate? Yes; Fluid consistency: Thin  Diet effective now       Question Answer Comment  Room service appropriate? Yes   Fluid consistency: Thin      03/14/21 2239            DVT prophylaxis: SCDs Start: 03/14/21 2239    Code Status: DNR  Family Communication: Sister Lora Havens [phone]  Status is: Inpatient  Remains inpatient appropriate because:Inpatient level of care appropriate due to severity of illness  Dispo: The patient is from: Home              Anticipated d/c is to: SNF              Patient currently is not medically stable to d/c.   Difficult to place patient No  Level of care: Progressive  Consultants:  Oncology Neurology  Procedures:  None   Microbiology  Blood cultures with E. coli and Proteus, final sensitivities pending  Antimicrobials: Ceftriaxone 9/1 >>   Objective: Vitals:   03/18/21 1943 03/18/21 2353 03/19/21 0414 03/19/21 0900  BP: (!) 155/85 (!) 161/87 (!) 150/97 134/76  Pulse: 82 84 89 97  Resp: 18 17 16 17   Temp: 98.6 F (37 C) 98.4 F (36.9 C) 98.3 F (36.8 C) 97.6 F (36.4 C)  TempSrc: Oral Oral Oral Oral  SpO2: 95% 95% 95% 95%  Weight:      Height:        Intake/Output Summary (Last 24 hours) at 03/19/2021 1053 Last data filed at 03/18/2021 2001 Gross per 24 hour  Intake 20 ml  Output --  Net 20 ml    Filed Weights   03/17/21 0015  Weight: 39.8 kg    Examination:  Constitutional: NAD Eyes: Anicteric ENMT: mmm Neck: normal, supple Respiratory: Clear bilaterally, no wheezing, no crackles Cardiovascular: Regular rate and rhythm, no murmurs, no peripheral edema Abdomen: Soft,  nontender, nondistended, bowel sounds positive Musculoskeletal: no clubbing / cyanosis.  Skin: No rashes seen Neurologic: No focal deficits   Data Reviewed: I have independently reviewed following labs and imaging studies   CBC: Recent Labs  Lab 03/14/21 1353 03/15/21 0555 03/16/21 0332 03/17/21 0324 03/18/21 0210 03/19/21 0500  WBC 1.5* 2.8* 4.1 6.9 13.1* 17.2*  NEUTROABS 1.2*  --  2.5  --  5.9  --   HGB 9.8* 9.7* 7.4* 7.4* 7.6* 8.0*  HCT 28.3* 27.5* 21.1* 20.7* 21.1* 22.3*  MCV 84.0 82.3 84.7 82.5 82.7 82.6  PLT 7* 6* 33* 31* 49* 80*    Basic Metabolic Panel: Recent Labs  Lab 03/15/21 0023 03/15/21 0555 03/16/21 0332 03/17/21 0324 03/18/21 0210 03/19/21 0500  NA  --  132* 135 134* 133* 129*  K  --  2.8* 3.4* 3.0* 4.0 3.5  CL  --  95* 103 99 99 91*  CO2  --  26 25 27 28 26   GLUCOSE  --  81 90 111* 124*  90  BUN  --  16 19 18 14 9   CREATININE  --  0.49 0.48 0.43* 0.32* <0.30*  CALCIUM  --  7.7* 7.7* 7.9* 8.0* 8.1*  MG 1.7  --   --   --  1.8  --   PHOS  --   --   --   --  1.6*  --     Liver Function Tests: Recent Labs  Lab 03/14/21 1353 03/15/21 0555 03/16/21 0332 03/18/21 0210  AST 15 16 12* 24  ALT 22 23 19 20   ALKPHOS 71 70 53 68  BILITOT 0.8 1.3* 1.0 0.7  PROT 5.9* 5.3* 4.5* 4.7*  ALBUMIN 2.6* 2.2* 1.9* 2.0*    Coagulation Profile: No results for input(s): INR, PROTIME in the last 168 hours. HbA1C: No results for input(s): HGBA1C in the last 72 hours. CBG: No results for input(s): GLUCAP in the last 168 hours.  Recent Results (from the past 240 hour(s))  Culture, blood (routine x 2)     Status: Abnormal   Collection Time: 03/14/21  1:43 PM   Specimen: Left Antecubital; Blood  Result Value Ref Range Status   Specimen Description   Final    LEFT ANTECUBITAL Performed at [facility] [address] [place], [place] [address]    Special Requests   Final    BOTTLES DRAWN AEROBIC AND ANAEROBIC Blood Culture adequate  volume Performed at [facility] [address] [place], [place] [address]    Culture  Setup Time   Final    GRAM NEGATIVE RODS IN BOTH AEROBIC AND ANAEROBIC BOTTLES CRITICAL VALUE NOTED.  VALUE IS CONSISTENT WITH PREVIOUSLY REPORTED AND CALLED VALUE. Performed at [facility], [place] [address], [place], [place] [address]    Culture ESCHERICHIA COLI PROTEUS MIRABILIS  (A)  Final   Report Status 03/17/2021 FINAL  Final   Organism ID, Bacteria ESCHERICHIA COLI  Final   Organism ID, Bacteria PROTEUS MIRABILIS  Final      Susceptibility   Escherichia coli - MIC*    AMPICILLIN >=32 RESISTANT Resistant     CEFAZOLIN 8 SENSITIVE Sensitive     CEFEPIME <=0.12 SENSITIVE Sensitive     CEFTAZIDIME <=1 SENSITIVE Sensitive     CEFTRIAXONE <=0.25 SENSITIVE Sensitive     CIPROFLOXACIN <=0.25 SENSITIVE Sensitive     GENTAMICIN <=1 SENSITIVE Sensitive     IMIPENEM <=0.25 SENSITIVE Sensitive     TRIMETH/SULFA <=20 SENSITIVE Sensitive     AMPICILLIN/SULBACTAM >=32 RESISTANT Resistant     PIP/TAZO <=4 SENSITIVE Sensitive     * ESCHERICHIA COLI   Proteus mirabilis - MIC*    AMPICILLIN <=2 SENSITIVE Sensitive     CEFAZOLIN <=4 SENSITIVE Sensitive     CEFEPIME <=0.12 SENSITIVE Sensitive     CEFTAZIDIME <=1 SENSITIVE Sensitive     CEFTRIAXONE <=0.25 SENSITIVE Sensitive     CIPROFLOXACIN <=0.25 SENSITIVE Sensitive     GENTAMICIN <=1 SENSITIVE Sensitive     IMIPENEM 2 SENSITIVE Sensitive     TRIMETH/SULFA <=20 SENSITIVE Sensitive     AMPICILLIN/SULBACTAM <=2 SENSITIVE Sensitive     PIP/TAZO <=4 SENSITIVE Sensitive     * PROTEUS MIRABILIS  Culture, blood (routine x 2)     Status: Abnormal   Collection Time: 03/14/21  1:53 PM   Specimen: Right Antecubital; Blood  Result Value Ref Range Status   Specimen Description   Final    RIGHT ANTECUBITAL Performed at [facility], [place]  [address] [place], [place] [address]    Special Requests   Final    BOTTLES  DRAWN AEROBIC AND ANAEROBIC Blood Culture adequate volume Performed at [facility] [address], [place], [place] [address]    Culture  Setup Time   Final    GRAM NEGATIVE RODS IN BOTH AEROBIC AND ANAEROBIC BOTTLES CRITICAL RESULT CALLED TO, READ BACK BY AND VERIFIED WITH: V BRYK,PHARMD@0723  03/15/21 [place]    Culture (A)  Final    PROTEUS MIRABILIS ESCHERICHIA COLI SUSCEPTIBILITIES PERFORMED ON PREVIOUS CULTURE WITHIN THE LAST 5 DAYS. Performed at [facility], [place] [address] [place], [place] [address]    Report Status 03/17/2021 FINAL  Final  Blood Culture ID Panel (Reflexed)     Status: Abnormal   Collection Time: 03/14/21  1:53 PM  Result Value Ref Range Status   Enterococcus faecalis NOT DETECTED NOT DETECTED Final   Enterococcus Faecium NOT DETECTED NOT DETECTED Final   Listeria monocytogenes NOT DETECTED NOT DETECTED Final   Staphylococcus species NOT DETECTED NOT DETECTED Final   Staphylococcus aureus (BCID) NOT DETECTED NOT DETECTED Final   Staphylococcus epidermidis NOT DETECTED NOT DETECTED Final   Staphylococcus lugdunensis NOT DETECTED NOT DETECTED Final   Streptococcus species NOT DETECTED NOT DETECTED Final   Streptococcus agalactiae NOT DETECTED NOT DETECTED Final   Streptococcus pneumoniae NOT DETECTED NOT DETECTED Final   Streptococcus pyogenes NOT DETECTED NOT DETECTED Final   A.calcoaceticus-baumannii NOT DETECTED NOT DETECTED Final   Bacteroides fragilis NOT DETECTED NOT DETECTED Final   Enterobacterales DETECTED (A) NOT DETECTED Final    Comment: CRITICAL RESULT CALLED TO, READ BACK BY AND VERIFIED WITH: V BRYK,PHARMD@0723  03/15/21 [place]    Enterobacter cloacae complex NOT DETECTED NOT DETECTED Final   Escherichia coli DETECTED (A) NOT DETECTED Final    Comment: CRITICAL RESULT CALLED TO, READ BACK BY AND VERIFIED WITH: V BRYK,PHARMD@0723  03/15/21 [place]    Klebsiella aerogenes NOT DETECTED NOT DETECTED Final   Klebsiella oxytoca NOT  DETECTED NOT DETECTED Final   Klebsiella pneumoniae NOT DETECTED NOT DETECTED Final   Proteus species DETECTED (A) NOT DETECTED Final    Comment: CRITICAL RESULT CALLED TO, READ BACK BY AND VERIFIED WITH: V BRYK,PHARMD@0723  03/15/21 [place]    Salmonella species NOT DETECTED NOT DETECTED Final   Serratia marcescens NOT DETECTED NOT DETECTED Final   Haemophilus influenzae NOT DETECTED NOT DETECTED Final   Neisseria meningitidis NOT DETECTED NOT DETECTED Final   Pseudomonas aeruginosa NOT DETECTED NOT DETECTED Final   Stenotrophomonas maltophilia NOT DETECTED NOT DETECTED Final   Candida albicans NOT DETECTED NOT DETECTED Final   Candida auris NOT DETECTED NOT DETECTED Final   Candida glabrata NOT DETECTED NOT DETECTED Final   Candida krusei NOT DETECTED NOT DETECTED Final   Candida parapsilosis NOT DETECTED NOT DETECTED Final   Candida tropicalis NOT DETECTED NOT DETECTED Final   Cryptococcus neoformans/gattii NOT DETECTED NOT DETECTED Final   CTX-M ESBL NOT DETECTED NOT DETECTED Final   Carbapenem resistance IMP NOT DETECTED NOT DETECTED Final   Carbapenem resistance KPC NOT DETECTED NOT DETECTED Final   Carbapenem resistance NDM NOT DETECTED NOT DETECTED Final   Carbapenem resist OXA 48 LIKE NOT DETECTED NOT DETECTED Final   Carbapenem resistance VIM NOT DETECTED NOT DETECTED Final    Comment: Performed at [facility], [address] [place], [place] [address]  Resp Panel by RT-PCR (Flu A&B, Covid) Nasopharyngeal Swab     Status: None   Collection Time:  03/14/21  1:54 PM   Specimen: Nasopharyngeal Swab; Nasopharyngeal(NP) swabs in vial transport medium  Result Value Ref Range Status   SARS Coronavirus 2 by RT PCR NEGATIVE NEGATIVE Final    Comment: (NOTE) SARS-CoV-2 target nucleic acids are NOT DETECTED.  The SARS-CoV-2 RNA is generally detectable in upper respiratory specimens during the acute phase of infection. The lowest concentration of SARS-CoV-2 viral copies this assay  can detect is 138 copies/mL. A negative result does not preclude SARS-Cov-2 infection and should not be used as the sole basis for treatment or other patient management decisions. A negative result may occur with  improper specimen collection/handling, submission of specimen other than nasopharyngeal swab, presence of viral mutation(s) within the areas targeted by this assay, and inadequate number of viral copies(<138 copies/mL). A negative result must be combined with clinical observations, patient history, and epidemiological information. The expected result is Negative.  Fact Sheet for Patients:  EntrepreneurPulse.com.au  Fact Sheet for Healthcare Providers:  IncredibleEmployment.be  This test is no t yet approved or cleared by the [place] FDA and  has been authorized for detection and/or diagnosis of SARS-CoV-2 by FDA under an Emergency Use Authorization (EUA). This EUA will remain  in effect (meaning this test can be used) for the duration of the COVID-19 declaration under Section 564(b)(1) of the Act, 21 U.S.C.section 360bbb-3(b)(1), unless the authorization is terminated  or revoked sooner.       Influenza A by PCR NEGATIVE NEGATIVE Final   Influenza B by PCR NEGATIVE NEGATIVE Final    Comment: (NOTE) The Xpert Xpress SARS-CoV-2/FLU/RSV plus assay is intended as an aid in the diagnosis of influenza from Nasopharyngeal swab specimens and should not be used as a sole basis for treatment. Nasal washings and aspirates are unacceptable for Xpert Xpress SARS-CoV-2/FLU/RSV testing.  Fact Sheet for Patients: EntrepreneurPulse.com.au  Fact Sheet for Healthcare Providers: IncredibleEmployment.be  This test is not yet approved or cleared by the [place] FDA and has been authorized for detection and/or diagnosis of SARS-CoV-2 by FDA under an Emergency Use Authorization (EUA). This EUA will  remain in effect (meaning this test can be used) for the duration of the COVID-19 declaration under Section 564(b)(1) of the Act, 21 U.S.C. section 360bbb-3(b)(1), unless the authorization is terminated or revoked.  Performed at [facility], [place] [address] [place], [place] [address]       Radiology Studies: No results found.  Marzetta Board, MD, PhD [facility]  Between 7 am - 7 pm I am available, please contact me via Amion (for emergencies) or Securechat (non urgent messages)  Between 7 pm - 7 am I am not available, please contact night coverage MD/APP via Amion

## 2021-03-19 NOTE — Plan of Care (Signed)
Max assist with adls.

## 2021-03-20 DIAGNOSIS — R7881 Bacteremia: Secondary | ICD-10-CM | POA: Diagnosis not present

## 2021-03-20 DIAGNOSIS — G9341 Metabolic encephalopathy: Secondary | ICD-10-CM | POA: Diagnosis not present

## 2021-03-20 LAB — BASIC METABOLIC PANEL WITH GFR
Anion gap: 10 (ref 5–15)
BUN: 12 mg/dL (ref 8–23)
CO2: 25 mmol/L (ref 22–32)
Calcium: 8.3 mg/dL — ABNORMAL LOW (ref 8.9–10.3)
Chloride: 95 mmol/L — ABNORMAL LOW (ref 98–111)
Creatinine, Ser: 0.4 mg/dL — ABNORMAL LOW (ref 0.44–1.00)
GFR, Estimated: >60 mL/min
Glucose, Bld: 130 mg/dL — ABNORMAL HIGH (ref 70–99)
Potassium: 3.6 mmol/L (ref 3.5–5.1)
Sodium: 130 mmol/L — ABNORMAL LOW (ref 135–145)

## 2021-03-20 LAB — CBC
HCT: 26.3 % — ABNORMAL LOW (ref 36.0–46.0)
Hemoglobin: 9.6 g/dL — ABNORMAL LOW (ref 12.0–15.0)
MCH: 30 pg (ref 26.0–34.0)
MCHC: 36.5 g/dL — ABNORMAL HIGH (ref 30.0–36.0)
MCV: 82.2 fL (ref 80.0–100.0)
Platelets: 152 10*3/uL (ref 150–400)
RBC: 3.2 MIL/uL — ABNORMAL LOW (ref 3.87–5.11)
RDW: 12.4 % (ref 11.5–15.5)
WBC: 26.4 10*3/uL — ABNORMAL HIGH (ref 4.0–10.5)
nRBC: 1.6 % — ABNORMAL HIGH (ref 0.0–0.2)

## 2021-03-20 MED ORDER — MAGIC MOUTHWASH
10.0000 mL | Freq: Four times a day (QID) | ORAL | Status: DC
  Administered 2021-03-20 – 2021-03-28 (×30): 10 mL via ORAL
  Filled 2021-03-20 (×36): qty 10

## 2021-03-20 NOTE — Plan of Care (Signed)

## 2021-03-20 NOTE — Progress Notes (Signed)
PROGRESS NOTE  Loretta Woods SLH:[id number] DOB: 13-Jan-1956 DOA: 03/14/2021 PCP: Leonard Downing, MD   LOS: 6 days   Brief Narrative / Interim history: 65 year old female with history of stage IV lung cancer with brain metastasis, HTN, HLD, hypothyroidism, seizures came into the hospital with altered mental status.  Patient apparently completed brain radiation couple of weeks ago and has been placed on chemotherapy 10 days ago.  Over the last couple of weeks she is becoming progressively weaker.  She was also occasionally confused and has been having a poor appetite in the last week.  Subjective / 24h Interval events: Feels a little bit nauseous this morning, which is not unusual for her since getting her chemotherapy.  No chest pain, no shortness of breath, no abdominal pain.  Assessment & Plan: Principal Problem Acute metabolic encephalopathy-likely multifactorial, possibly related to the brain mets as well as bacteremia.  An MRI of the brain on admission showed multiple metastatic deposits stable since 02/06/2021, the largest lesion in the right temporal lobe has a significant dural component likely due to a dural metastasis while inpatient with surrounding edema and mass-effect. -For now continue Decadron to minimize swelling, continue Keppra, supportive care -Neurology consulted as well, signed off 9/1  Active Problems E. Coli and Proteus bacteremia-blood cultures were positive for gram-negative rods, BC ID showing E. coli as well as Proteus species -She is currently on ceftriaxone, continue, sensitivities show resistance to ampicillin and Augmentin for the E. coli but otherwise sensitive. -ID consulted, plans to treat with ceftriaxone for total of 10 days with end date Saturday 9/10  Stage IV adenocarcinoma of the lung-status post whole brain radiation but beginning of August and started chemotherapy with Dr. Lorenso Courier 8/22.   Deconditioning, weakness-PT recommends SNF, patient  agreeable, placement pending, no bed offers yet and insurance authorization has not been started  Pancytopenia, chemotherapy-induced-she received Granix on admission, hold further doses, white count now increasing -Received a unit of platelets 9/1 for platelets of 6, responded well, currently platelets increasing on their own and normalized today -Hemoglobin seems to recover as well  Hypothyroidism-TSH 7.0, no need to adjust her current Synthroid dose, not high enough to explain her lethargy and may be in the setting of acute illness.  Monitor as an outpatient and repeat TSH in 3 to 4 weeks  Seizure disorder-continue Keppra  Hypokalemia-due to poor p.o. intake, continue to replete  Hypertension-continue home regimen  Hyponatremia-overall stable  Hyperlipidemia -continue statin  Goals of Care - appropriate to consult Palliative, she is DNR  Pressure injury, POA Pressure Injury 03/15/21 Buttocks Right;Left Unstageable - Full thickness tissue loss in which the base of the injury is covered by slough (yellow, tan, gray, green or brown) and/or eschar (tan, brown or black) in the wound bed. moisture associated skin  (Active)  03/15/21   Location: Buttocks  Location Orientation: Right;Left  Staging: Unstageable - Full thickness tissue loss in which the base of the injury is covered by slough (yellow, tan, gray, green or brown) and/or eschar (tan, brown or black) in the wound bed.  Wound Description (Comments): moisture associated skin damage to bilat buttocks with patchy areas of Unstageable pressure injuries  Present on Admission: Yes   Scheduled Meds:  atorvastatin  10 mg Oral Daily   Chlorhexidine Gluconate Cloth  6 each Topical Daily   dexamethasone  4 mg Oral BID   feeding supplement  237 mL Oral BID BM   folic acid  1 mg Oral Daily   Gerhardt's  butt cream   Topical QID   hydrochlorothiazide  25 mg Oral Daily   levETIRAcetam  500 mg Oral BID   levothyroxine  75 mcg Oral Q0600    mouth rinse  15 mL Mouth Rinse BID   multivitamin with minerals  1 tablet Oral Daily   potassium chloride  40 mEq Oral Daily   sodium chloride flush  10-40 mL Intracatheter Q12H   thiamine injection  100 mg Intravenous Daily   Or   thiamine  100 mg Oral Daily   Continuous Infusions:  cefTRIAXone (ROCEPHIN)  IV 2 g (03/19/21 0942)   PRN Meds:.acetaminophen **OR** acetaminophen, metoprolol tartrate, ondansetron **OR** ondansetron (ZOFRAN) IV, prochlorperazine, sodium chloride flush  Diet Orders (From admission, onward)     Start     Ordered   03/14/21 2239  Diet Heart Room service appropriate? Yes; Fluid consistency: Thin  Diet effective now       Question Answer Comment  Room service appropriate? Yes   Fluid consistency: Thin      03/14/21 2239            DVT prophylaxis: SCDs Start: 03/14/21 2239    Code Status: DNR  Family Communication: Sister Lora Havens [phone]  Status is: Inpatient  Remains inpatient appropriate because:Inpatient level of care appropriate due to severity of illness  Dispo: The patient is from: Home              Anticipated d/c is to: SNF              Patient currently is not medically stable to d/c.   Difficult to place patient No  Level of care: Progressive  Consultants:  Oncology Neurology  Procedures:  None   Microbiology  Blood cultures with E. coli and Proteus, final sensitivities pending  Antimicrobials: Ceftriaxone 9/1 >>   Objective: Vitals:   03/19/21 2012 03/19/21 2357 03/20/21 0339 03/20/21 0829  BP: (!) 151/87 (!) 159/93 (!) 152/93 (!) 152/94  Pulse: 89 93 97 (!) 104  Resp: 18 17 18 20   Temp: 98 F (36.7 C) 98 F (36.7 C) 98.2 F (36.8 C) 97.6 F (36.4 C)  TempSrc: Oral Oral Oral Oral  SpO2: 96% 96% 97% 96%  Weight:      Height:        Intake/Output Summary (Last 24 hours) at 03/20/2021 0956 Last data filed at 03/19/2021 1100 Gross per 24 hour  Intake --  Output 450 ml  Net -450 ml    Filed Weights    03/17/21 0015  Weight: 39.8 kg    Examination:  Constitutional: She is in no distress Eyes: No scleral icterus ENMT: mmm Neck: normal, supple Respiratory: Lungs are clear bilaterally, no wheezing, no crackles Cardiovascular: Regular rate and rhythm, no murmurs, no peripheral edema Abdomen: Soft, nontender, nondistended, positive bowel sounds Musculoskeletal: no clubbing / cyanosis.  Skin: No rashes seen Neurologic: Nonfocal, equal strength   Data Reviewed: I have independently reviewed following labs and imaging studies   CBC: Recent Labs  Lab 03/14/21 1353 03/15/21 0555 03/16/21 0332 03/17/21 0324 03/18/21 0210 03/19/21 0500 03/20/21 0558  WBC 1.5*   < > 4.1 6.9 13.1* 17.2* 26.4*  NEUTROABS 1.2*  --  2.5  --  5.9  --   --   HGB 9.8*   < > 7.4* 7.4* 7.6* 8.0* 9.6*  HCT 28.3*   < > 21.1* 20.7* 21.1* 22.3* 26.3*  MCV 84.0   < > 84.7 82.5 82.7 82.6 82.2  PLT  7*   < > 33* 31* 49* 80* 152   < > = values in this interval not displayed.    Basic Metabolic Panel: Recent Labs  Lab 03/15/21 0023 03/15/21 0555 03/16/21 0332 03/17/21 0324 03/18/21 0210 03/19/21 0500 03/20/21 0558  NA  --    < > 135 134* 133* 129* 130*  K  --    < > 3.4* 3.0* 4.0 3.5 3.6  CL  --    < > 103 99 99 91* 95*  CO2  --    < > 25 27 28 26 25   GLUCOSE  --    < > 90 111* 124* 90 130*  BUN  --    < > 19 18 14 9 12   CREATININE  --    < > 0.48 0.43* 0.32* <0.30* 0.40*  CALCIUM  --    < > 7.7* 7.9* 8.0* 8.1* 8.3*  MG 1.7  --   --   --  1.8  --   --   PHOS  --   --   --   --  1.6*  --   --    < > = values in this interval not displayed.    Liver Function Tests: Recent Labs  Lab 03/14/21 1353 03/15/21 0555 03/16/21 0332 03/18/21 0210  AST 15 16 12* 24  ALT 22 23 19 20   ALKPHOS 71 70 53 68  BILITOT 0.8 1.3* 1.0 0.7  PROT 5.9* 5.3* 4.5* 4.7*  ALBUMIN 2.6* 2.2* 1.9* 2.0*    Coagulation Profile: No results for input(s): INR, PROTIME in the last 168 hours. HbA1C: No results for  input(s): HGBA1C in the last 72 hours. CBG: No results for input(s): GLUCAP in the last 168 hours.  Recent Results (from the past 240 hour(s))  Culture, blood (routine x 2)     Status: Abnormal   Collection Time: 03/14/21  1:43 PM   Specimen: Left Antecubital; Blood  Result Value Ref Range Status   Specimen Description   Final    LEFT ANTECUBITAL Performed at [facility] [address] [place], [place] [address]    Special Requests   Final    BOTTLES DRAWN AEROBIC AND ANAEROBIC Blood Culture adequate volume Performed at [facility] [address] [place], [place] [address]    Culture  Setup Time   Final    GRAM NEGATIVE RODS IN BOTH AEROBIC AND ANAEROBIC BOTTLES CRITICAL VALUE NOTED.  VALUE IS CONSISTENT WITH PREVIOUSLY REPORTED AND CALLED VALUE. Performed at [facility], [place] [address], [place], [place] [address]    Culture ESCHERICHIA COLI PROTEUS MIRABILIS  (A)  Final   Report Status 03/17/2021 FINAL  Final   Organism ID, Bacteria ESCHERICHIA COLI  Final   Organism ID, Bacteria PROTEUS MIRABILIS  Final      Susceptibility   Escherichia coli - MIC*    AMPICILLIN >=32 RESISTANT Resistant     CEFAZOLIN 8 SENSITIVE Sensitive     CEFEPIME <=0.12 SENSITIVE Sensitive     CEFTAZIDIME <=1 SENSITIVE Sensitive     CEFTRIAXONE <=0.25 SENSITIVE Sensitive     CIPROFLOXACIN <=0.25 SENSITIVE Sensitive     GENTAMICIN <=1 SENSITIVE Sensitive     IMIPENEM <=0.25 SENSITIVE Sensitive     TRIMETH/SULFA <=20 SENSITIVE Sensitive     AMPICILLIN/SULBACTAM >=32 RESISTANT Resistant     PIP/TAZO <=4 SENSITIVE Sensitive     * ESCHERICHIA COLI   Proteus mirabilis - MIC*  AMPICILLIN <=2 SENSITIVE Sensitive     CEFAZOLIN <=4 SENSITIVE Sensitive     CEFEPIME <=0.12 SENSITIVE Sensitive     CEFTAZIDIME <=1 SENSITIVE Sensitive     CEFTRIAXONE <=0.25 SENSITIVE Sensitive     CIPROFLOXACIN <=0.25 SENSITIVE Sensitive     GENTAMICIN <=1 SENSITIVE  Sensitive     IMIPENEM 2 SENSITIVE Sensitive     TRIMETH/SULFA <=20 SENSITIVE Sensitive     AMPICILLIN/SULBACTAM <=2 SENSITIVE Sensitive     PIP/TAZO <=4 SENSITIVE Sensitive     * PROTEUS MIRABILIS  Culture, blood (routine x 2)     Status: Abnormal   Collection Time: 03/14/21  1:53 PM   Specimen: Right Antecubital; Blood  Result Value Ref Range Status   Specimen Description   Final    RIGHT ANTECUBITAL Performed at [facility] [address], [place], [place] [address]    Special Requests   Final    BOTTLES DRAWN AEROBIC AND ANAEROBIC Blood Culture adequate volume Performed at [facility] [address] [place], [place] [address]    Culture  Setup Time   Final    GRAM NEGATIVE RODS IN BOTH AEROBIC AND ANAEROBIC BOTTLES CRITICAL RESULT CALLED TO, READ BACK BY AND VERIFIED WITH: V BRYK,PHARMD@0723  03/15/21 [place]    Culture (A)  Final    PROTEUS MIRABILIS ESCHERICHIA COLI SUSCEPTIBILITIES PERFORMED ON PREVIOUS CULTURE WITHIN THE LAST 5 DAYS. Performed at [facility], [place] [address] [place], [place] [address]    Report Status 03/17/2021 FINAL  Final  Blood Culture ID Panel (Reflexed)     Status: Abnormal   Collection Time: 03/14/21  1:53 PM  Result Value Ref Range Status   Enterococcus faecalis NOT DETECTED NOT DETECTED Final   Enterococcus Faecium NOT DETECTED NOT DETECTED Final   Listeria monocytogenes NOT DETECTED NOT DETECTED Final   Staphylococcus species NOT DETECTED NOT DETECTED Final   Staphylococcus aureus (BCID) NOT DETECTED NOT DETECTED Final   Staphylococcus epidermidis NOT DETECTED NOT DETECTED Final   Staphylococcus lugdunensis NOT DETECTED NOT DETECTED Final   Streptococcus species NOT DETECTED NOT DETECTED Final   Streptococcus agalactiae NOT DETECTED NOT DETECTED Final   Streptococcus pneumoniae NOT DETECTED NOT DETECTED Final   Streptococcus pyogenes NOT DETECTED NOT DETECTED Final   A.calcoaceticus-baumannii  NOT DETECTED NOT DETECTED Final   Bacteroides fragilis NOT DETECTED NOT DETECTED Final   Enterobacterales DETECTED (A) NOT DETECTED Final    Comment: CRITICAL RESULT CALLED TO, READ BACK BY AND VERIFIED WITH: V BRYK,PHARMD@0723  03/15/21 [place]    Enterobacter cloacae complex NOT DETECTED NOT DETECTED Final   Escherichia coli DETECTED (A) NOT DETECTED Final    Comment: CRITICAL RESULT CALLED TO, READ BACK BY AND VERIFIED WITH: V BRYK,PHARMD@0723  03/15/21 [place]    Klebsiella aerogenes NOT DETECTED NOT DETECTED Final   Klebsiella oxytoca NOT DETECTED NOT DETECTED Final   Klebsiella pneumoniae NOT DETECTED NOT DETECTED Final   Proteus species DETECTED (A) NOT DETECTED Final    Comment: CRITICAL RESULT CALLED TO, READ BACK BY AND VERIFIED WITH: V BRYK,PHARMD@0723  03/15/21 [place]    Salmonella species NOT DETECTED NOT DETECTED Final   Serratia marcescens NOT DETECTED NOT DETECTED Final   Haemophilus influenzae NOT DETECTED NOT DETECTED Final   Neisseria meningitidis NOT DETECTED NOT DETECTED Final   Pseudomonas aeruginosa NOT DETECTED NOT DETECTED Final   Stenotrophomonas maltophilia NOT DETECTED NOT DETECTED Final   Candida albicans NOT DETECTED NOT DETECTED Final   Candida auris NOT DETECTED NOT DETECTED Final  Candida glabrata NOT DETECTED NOT DETECTED Final   Candida krusei NOT DETECTED NOT DETECTED Final   Candida parapsilosis NOT DETECTED NOT DETECTED Final   Candida tropicalis NOT DETECTED NOT DETECTED Final   Cryptococcus neoformans/gattii NOT DETECTED NOT DETECTED Final   CTX-M ESBL NOT DETECTED NOT DETECTED Final   Carbapenem resistance IMP NOT DETECTED NOT DETECTED Final   Carbapenem resistance KPC NOT DETECTED NOT DETECTED Final   Carbapenem resistance NDM NOT DETECTED NOT DETECTED Final   Carbapenem resist OXA 48 LIKE NOT DETECTED NOT DETECTED Final   Carbapenem resistance VIM NOT DETECTED NOT DETECTED Final    Comment: Performed at [facility], [place] [address],  [place], [place] [address]  Resp Panel by RT-PCR (Flu A&B, Covid) Nasopharyngeal Swab     Status: None   Collection Time: 03/14/21  1:54 PM   Specimen: Nasopharyngeal Swab; Nasopharyngeal(NP) swabs in vial transport medium  Result Value Ref Range Status   SARS Coronavirus 2 by RT PCR NEGATIVE NEGATIVE Final    Comment: (NOTE) SARS-CoV-2 target nucleic acids are NOT DETECTED.  The SARS-CoV-2 RNA is generally detectable in upper respiratory specimens during the acute phase of infection. The lowest concentration of SARS-CoV-2 viral copies this assay can detect is 138 copies/mL. A negative result does not preclude SARS-Cov-2 infection and should not be used as the sole basis for treatment or other patient management decisions. A negative result may occur with  improper specimen collection/handling, submission of specimen other than nasopharyngeal swab, presence of viral mutation(s) within the areas targeted by this assay, and inadequate number of viral copies(<138 copies/mL). A negative result must be combined with clinical observations, patient history, and epidemiological information. The expected result is Negative.  Fact Sheet for Patients:  EntrepreneurPulse.com.au  Fact Sheet for Healthcare Providers:  IncredibleEmployment.be  This test is no t yet approved or cleared by the [place] FDA and  has been authorized for detection and/or diagnosis of SARS-CoV-2 by FDA under an Emergency Use Authorization (EUA). This EUA will remain  in effect (meaning this test can be used) for the duration of the COVID-19 declaration under Section 564(b)(1) of the Act, 21 U.S.C.section 360bbb-3(b)(1), unless the authorization is terminated  or revoked sooner.       Influenza A by PCR NEGATIVE NEGATIVE Final   Influenza B by PCR NEGATIVE NEGATIVE Final    Comment: (NOTE) The Xpert Xpress SARS-CoV-2/FLU/RSV plus assay is intended as an aid in the diagnosis of  influenza from Nasopharyngeal swab specimens and should not be used as a sole basis for treatment. Nasal washings and aspirates are unacceptable for Xpert Xpress SARS-CoV-2/FLU/RSV testing.  Fact Sheet for Patients: EntrepreneurPulse.com.au  Fact Sheet for Healthcare Providers: IncredibleEmployment.be  This test is not yet approved or cleared by the [place] FDA and has been authorized for detection and/or diagnosis of SARS-CoV-2 by FDA under an Emergency Use Authorization (EUA). This EUA will remain in effect (meaning this test can be used) for the duration of the COVID-19 declaration under Section 564(b)(1) of the Act, 21 U.S.C. section 360bbb-3(b)(1), unless the authorization is terminated or revoked.  Performed at [facility], [place] [address] [place], [place] [address]       Radiology Studies: No results found.  Marzetta Board, MD, PhD [facility]  Between 7 am - 7 pm I am available, please contact me via Amion (for emergencies) or Securechat (non urgent messages)  Between 7 pm - 7 am I am not available, please contact  night coverage MD/APP via Amion

## 2021-03-20 NOTE — Progress Notes (Signed)
Bladder scan performed, 0 mL shown, pt voided just prior to scan, see flowsheets for details.

## 2021-03-20 NOTE — Plan of Care (Signed)
  Problem: Education: Goal: Knowledge of disease or condition will improve Outcome: Progressing Goal: Knowledge of secondary prevention will improve Outcome: Progressing Goal: Knowledge of patient specific risk factors addressed and post discharge goals established will improve Outcome: Progressing   Problem: Coping: Goal: Will verbalize positive feelings about self Outcome: Progressing Goal: Will identify appropriate support needs Outcome: Progressing   Problem: Health Behavior/Discharge Planning: Goal: Ability to manage health-related needs will improve Outcome: Progressing   Problem: Self-Care: Goal: Ability to participate in self-care as condition permits will improve Outcome: Progressing Goal: Verbalization of feelings and concerns over difficulty with self-care will improve Outcome: Progressing Goal: Ability to communicate needs accurately will improve Outcome: Progressing   Problem: Nutrition: Goal: Risk of aspiration will decrease Outcome: Progressing Goal: Dietary intake will improve Outcome: Progressing   Problem: Intracerebral Hemorrhage Tissue Perfusion: Goal: Complications of Intracerebral Hemorrhage will be minimized Outcome: Progressing   Problem: Ischemic Stroke/TIA Tissue Perfusion: Goal: Complications of ischemic stroke/TIA will be minimized Outcome: Progressing   Problem: Spontaneous Subarachnoid Hemorrhage Tissue Perfusion: Goal: Complications of Spontaneous Subarachnoid Hemorrhage will be minimized Outcome: Progressing   Problem: Education: Goal: Knowledge of General Education information will improve Description: Including pain rating scale, medication(s)/side effects and non-pharmacologic comfort measures Outcome: Progressing   Problem: Health Behavior/Discharge Planning: Goal: Ability to manage health-related needs will improve Outcome: Progressing   Problem: Clinical Measurements: Goal: Ability to maintain clinical measurements within  normal limits will improve Outcome: Progressing Goal: Will remain free from infection Outcome: Progressing Goal: Diagnostic test results will improve Outcome: Progressing Goal: Respiratory complications will improve Outcome: Progressing Goal: Cardiovascular complication will be avoided Outcome: Progressing   Problem: Activity: Goal: Risk for activity intolerance will decrease Outcome: Progressing   Problem: Nutrition: Goal: Adequate nutrition will be maintained Outcome: Progressing   Problem: Coping: Goal: Level of anxiety will decrease Outcome: Progressing   Problem: Elimination: Goal: Will not experience complications related to bowel motility Outcome: Progressing Goal: Will not experience complications related to urinary retention Outcome: Progressing

## 2021-03-20 NOTE — TOC Progression Note (Addendum)
Transition of Care [facility]) - Progression Note    Patient Details  Name: Loretta Woods MRN: [id number] Date of Birth: January 13, 1956  Transition of Care [facility]) CM/SW [place], [place] Phone Number: 03/20/2021, 10:58 AM  Clinical Narrative:    CSW spoke about patient's referral to [facility] and [facility]. [facility] is unable to offer a bed as they are full, but [facility] can accept patient as long as her chemo continues after rehab completes. CSW met with patient to provide information on [facility], and discuss how that is the only bed offer that the patient has with in-network facilities. Patient asked about [facility] or any SNF in [place], but there are no SNF in [place] that accept the patient's insurance. Patient was visibly frustrated by news and asked for time to think about it. CSW to follow back up with patient later to see what she wants to do.  UPDATE 3:25 PM: CSW met with patient, patient's cousin, and patient's mother at bedside to discuss SNF placement. CSW answered questions and discussed options available, and patient now agreeable to [facility] for SNF. CSW notified [facility] to [employer] authorization.     Expected Discharge Plan: Skilled Nursing Facility Barriers to Discharge: Ship broker, Continued Medical Work up, SNF Pending bed offer  Expected Discharge Plan and Services Expected Discharge Plan: [place] Choice: [place] arrangements for the past 2 months: Single Family Home                                       Social Determinants of Health (SDOH) Interventions    Readmission Risk Interventions No flowsheet data found.

## 2021-03-21 ENCOUNTER — Encounter: Payer: Self-pay | Admitting: Hematology and Oncology

## 2021-03-21 DIAGNOSIS — R7881 Bacteremia: Secondary | ICD-10-CM | POA: Diagnosis not present

## 2021-03-21 DIAGNOSIS — I10 Essential (primary) hypertension: Secondary | ICD-10-CM | POA: Diagnosis not present

## 2021-03-21 LAB — CBC
HCT: 23.3 % — ABNORMAL LOW (ref 36.0–46.0)
Hemoglobin: 8.3 g/dL — ABNORMAL LOW (ref 12.0–15.0)
MCH: 29.9 pg (ref 26.0–34.0)
MCHC: 35.6 g/dL (ref 30.0–36.0)
MCV: 83.8 fL (ref 80.0–100.0)
Platelets: 172 10*3/uL (ref 150–400)
RBC: 2.78 MIL/uL — ABNORMAL LOW (ref 3.87–5.11)
RDW: 12.6 % (ref 11.5–15.5)
WBC: 29 10*3/uL — ABNORMAL HIGH (ref 4.0–10.5)
nRBC: 2.7 % — ABNORMAL HIGH (ref 0.0–0.2)

## 2021-03-21 LAB — BASIC METABOLIC PANEL
Anion gap: 9 (ref 5–15)
BUN: 19 mg/dL (ref 8–23)
CO2: 26 mmol/L (ref 22–32)
Calcium: 8.1 mg/dL — ABNORMAL LOW (ref 8.9–10.3)
Chloride: 97 mmol/L — ABNORMAL LOW (ref 98–111)
Creatinine, Ser: 0.47 mg/dL (ref 0.44–1.00)
GFR, Estimated: >60 mL/min (ref >60)
Glucose, Bld: 117 mg/dL — ABNORMAL HIGH (ref 70–99)
Potassium: 3.5 mmol/L (ref 3.5–5.1)
Sodium: 132 mmol/L — ABNORMAL LOW (ref 135–145)

## 2021-03-21 LAB — PATHOLOGIST SMEAR REVIEW

## 2021-03-21 MED ORDER — METOPROLOL TARTRATE 25 MG PO TABS
25.0000 mg | ORAL_TABLET | Freq: Two times a day (BID) | ORAL | Status: DC
  Administered 2021-03-21 – 2021-03-28 (×15): 25 mg via ORAL
  Filled 2021-03-21 (×15): qty 1

## 2021-03-21 NOTE — Progress Notes (Signed)
Physical Therapy Treatment Patient Details Name: Loretta Woods MRN: [id number] DOB: [birth year] Today's Date: 03/21/2021    History of Present Illness 65 yo female admitted on 8/31 for progressive weakness from chemotherapy and new confusion with poor appetite.  Pt has family involved, who are aware of these issues.  Dx with acute metabolic encephalopathy from brain mets, bacteremia c mult bacteria.  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 PMHx:  seizures, deconditioning, HTN, HLD, lung adenocarcinoma.    PT Comments    Pt received in supine, agreeable to therapy session and with good participation and fair tolerance for transfers and short gait trial at bedside. Pt limited due to tachycardia (HR to 140's bpm per tele monitor) with exertion and quick to fatigue and with noted skin breakdown on bottom (pt with maceration in bed as well, needs frequent skin checks due to urinary incontinence, RN notified). Pt performed functional mobility tasks with min to modA and RW support. Pt continues to benefit from PT services to progress toward functional mobility goals. Continue to recommend SNF.  Follow Up Recommendations  SNF     Equipment Recommendations  Rolling walker with 5" wheels    Recommendations for Other Services       Precautions / Restrictions Precautions Precautions: Fall Precaution Comments: significant skin breakdown on bottom Restrictions Weight Bearing Restrictions: No    Mobility  Bed Mobility Overal bed mobility: Needs Assistance Bed Mobility: Supine to Sit;Sit to Supine     Supine to sit: Min assist     General bed mobility comments: min assist to scoot legs off bed and assist to sit; needs maxA to move hips closer to EOB via pad in sidelying prior to raising trunk (did it this way due to skin maceration/breakdown on bottom wanting to avoid shearing)    Transfers Overall transfer level: Needs assistance Equipment used: Rolling  walker (2 wheeled);1 person hand held assist Transfers: Sit to/from Stand Sit to Stand: Min assist         General transfer comment: light assist to power up but is fatigued quickly; x2 reps from EOB; pad under her is wet so pt needs assist for peri-care  Ambulation/Gait Ambulation/Gait assistance: Min assist;Mod assist Gait Distance (Feet): 5 Feet Assistive device: Rolling walker (2 wheeled) Gait Pattern/deviations: Step-to pattern;Decreased stride length;Narrow base of support;Trunk flexed Gait velocity: <0.2 m/s   General Gait Details: sidesteps on side of bed, using RW for support and max cues for control of posture; pt needs manual assist for RW support and up to modA when turning/pivoting due to fatigue/poor posture; poor foot clearance initially but able to clear B feet with max cues each step   Stairs             Wheelchair Mobility    Modified Rankin (Stroke Patients Only)       Balance Overall balance assessment: Needs assistance Sitting-balance support: Bilateral upper extremity supported Sitting balance-Leahy Scale: Fair Sitting balance - Comments: pt needs BUE support, with 0 UE support needs min guard for safety   Standing balance support: Bilateral upper extremity supported;During functional activity Standing balance-Leahy Scale: Poor Standing balance comment: min/modA for standing balance at RW, increased assist needed during peri-care                            Cognition Arousal/Alertness: Awake/alert Behavior During Therapy: Flat affect Overall Cognitive Status: Impaired/Different from baseline Area of Impairment:  Following commands;Attention                   Current Attention Level: Selective   Following Commands: Follows one step commands with increased time       General Comments: slow responses to all mobility cues and limited energy to stand; pt has difficulty following 2-step instructions (difficulty especially with  contralateral hip scooting for improved chair posture)      Exercises Other Exercises Other Exercises: cues for BLE AROM: ankle pumps, quad sets, glute sets, pt fatigued and limited participation once in chair, will need to reinforce    General Comments General comments (skin integrity, edema, etc.): HR elevated to 140 bpm while stepping from bed>chair per tele monitor, HR 120's bpm once resting in chair; SpO2 WNL on RA      Pertinent Vitals/Pain Pain Assessment: Faces Faces Pain Scale: Hurts even more Pain Location: back and bottom (skin breakdown, RN notified she would benefit from foam pad/skin barrier ointment), geomat cushion ordered for room via unit secretary but not arrived to unit by end of session Pain Descriptors / Indicators: Guarding;Grimacing;Discomfort Pain Intervention(s): Limited activity within patient's tolerance;Monitored during session;Repositioned (extensive instruction on pressure relief technique and frequency)    Home Living                      Prior Function            PT Goals (current goals can now be found in the care plan section) Acute Rehab PT Goals Patient Stated Goal: to get stronger and go home, less pain on bottom PT Goal Formulation: With patient/family Time For Goal Achievement: 03/30/21 Potential to Achieve Goals: Good Progress towards PT goals: Progressing toward goals    Frequency    Min 2X/week      PT Plan Current plan remains appropriate    Co-evaluation              AM-PAC PT "6 Clicks" Mobility   Outcome Measure  Help needed turning from your back to your side while in a flat bed without using bedrails?: A Little Help needed moving from lying on your back to sitting on the side of a flat bed without using bedrails?: A Little Help needed moving to and from a bed to a chair (including a wheelchair)?: A Little Help needed standing up from a chair using your arms (e.g., wheelchair or bedside chair)?: A  Little Help needed to walk in hospital room?: A Lot Help needed climbing 3-5 steps with a railing? : Total 6 Click Score: 15    End of Session Equipment Utilized During Treatment: Gait belt Activity Tolerance: Patient limited by fatigue;Treatment limited secondary to medical complications (Comment) (tachycardia) Patient left: with call bell/phone within reach;in chair;with chair alarm set Nurse Communication: Mobility status;Other (comment) (pt with sacral skin breakdown, needs dressing vs skin barrier (briefs donned during session with pad to absorb in case of accident); pt needs a bath, her bed linens had urine on them) PT Visit Diagnosis: Unsteadiness on feet (R26.81);Muscle weakness (generalized) (M62.81);Pain;Adult, failure to thrive (R62.7) Pain - Right/Left:  (back) Pain - part of body:  (back/bottom)     Time: 8416-6063 PT Time Calculation (min) (ACUTE ONLY): 36 min  Charges:  $Gait Training: 8-22 mins $Therapeutic Activity: 8-22 mins                     Daniela Hernan P., PTA Acute Rehabilitation Services Pager: [phone] Office: [phone]  Rakiyah Esch M Xyon Lukasik 03/21/2021, 1:08 PM

## 2021-03-21 NOTE — Progress Notes (Signed)
[facility] for Infectious Disease  Date of Admission:  03/14/2021   Total days of antibiotics 7        Ceftriaxone  ASSESSMENT: Patient is clinically stable.  She has been afebrile since 9/1.  Blood pressure and heart rate also stable.  Her white count is trending up 6.9 > 13.1 > 17.2 > 26.4 > 29.  I do not believe she has a new infection or untreated infection.  Her physical exam is reassuring.  Elevated white count is likely due to Decadron and granulocyte colony-stimulating factor.  Elevation of hemoglobin and platelet count support this.  Will continue ceftriaxone for Proteus and E. coli bacteremia to finish 10 days course of treatment.  End date is 9/10  PLAN: Continue ceftriaxone  Active Problems:   Acute metabolic encephalopathy   Scheduled Meds:  atorvastatin  10 mg Oral Daily   Chlorhexidine Gluconate Cloth  6 each Topical Daily   dexamethasone  4 mg Oral BID   feeding supplement  237 mL Oral BID BM   folic acid  1 mg Oral Daily   Gerhardt's butt cream   Topical QID   hydrochlorothiazide  25 mg Oral Daily   levETIRAcetam  500 mg Oral BID   levothyroxine  75 mcg Oral Q0600   magic mouthwash  10 mL Oral QID   mouth rinse  15 mL Mouth Rinse BID   multivitamin with minerals  1 tablet Oral Daily   potassium chloride  40 mEq Oral Daily   sodium chloride flush  10-40 mL Intracatheter Q12H   thiamine injection  100 mg Intravenous Daily   Or   thiamine  100 mg Oral Daily   Continuous Infusions:  cefTRIAXone (ROCEPHIN)  IV 2 g (03/21/21 0820)   PRN Meds:.acetaminophen **OR** acetaminophen, metoprolol tartrate, ondansetron **OR** ondansetron (ZOFRAN) IV, prochlorperazine, sodium chloride flush   SUBJECTIVE: Patient is seen at bedside.  She appears comfortable and in no acute distress.  She reports feeling fine with no acute complaints.  Denies abdominal pain, dysuria.  Report mild pain on the backside due to sacral wound.  Review of Systems: ROS Per  HPI  No Known Allergies  OBJECTIVE: Vitals:   03/20/21 2033 03/20/21 2353 03/21/21 0325 03/21/21 0752  BP: (!) 150/92 (!) 165/92 (!) 150/89 (!) 155/90  Pulse: 97 94 98 91  Resp: 18 18 18 20   Temp: 98.7 F (37.1 C) 99 F (37.2 C) 98.3 F (36.8 C) 98 F (36.7 C)  TempSrc: Oral Oral Oral Oral  SpO2: 95% 96% 95% 94%  Weight:      Height:       Body mass index is 16.06 kg/m.  Physical Exam Constitutional:      Comments: Appears fatigued but in no acute distress  HENT:     Mouth/Throat:     Comments: Dry lips Cardiovascular:     Rate and Rhythm: Normal rate and regular rhythm.     Heart sounds: Normal heart sounds.  Pulmonary:     Effort: Pulmonary effort is normal. No respiratory distress.     Breath sounds: Normal breath sounds.  Abdominal:     General: There is no distension.     Tenderness: There is no abdominal tenderness.  Musculoskeletal:        General: Normal range of motion.  Skin:    General: Skin is warm.     Comments: Sacral wound noted.  Did not observe any open skin.  Barrier  cream in place.  Neurological:     Mental Status: She is oriented to person, place, and time.  Psychiatric:        Mood and Affect: Mood normal.        Behavior: Behavior normal.    Lab Results Lab Results  Component Value Date   WBC 29.0 (H) 03/21/2021   HGB 8.3 (L) 03/21/2021   HCT 23.3 (L) 03/21/2021   MCV 83.8 03/21/2021   PLT 172 03/21/2021    Lab Results  Component Value Date   CREATININE 0.47 03/21/2021   BUN 19 03/21/2021   NA 132 (L) 03/21/2021   K 3.5 03/21/2021   CL 97 (L) 03/21/2021   CO2 26 03/21/2021    Lab Results  Component Value Date   ALT 20 03/18/2021   AST 24 03/18/2021   ALKPHOS 68 03/18/2021   BILITOT 0.7 03/18/2021     Microbiology: Recent Results (from the past 240 hour(s))  Culture, blood (routine x 2)     Status: Abnormal   Collection Time: 03/14/21  1:43 PM   Specimen: Left Antecubital; Blood  Result Value Ref Range Status    Specimen Description   Final    LEFT ANTECUBITAL Performed at [facility], [place] [address], [place], [place] [address]    Special Requests   Final    BOTTLES DRAWN AEROBIC AND ANAEROBIC Blood Culture adequate volume Performed at [place] [address] [place], [place] [address]    Culture  Setup Time   Final    GRAM NEGATIVE RODS IN BOTH AEROBIC AND ANAEROBIC BOTTLES CRITICAL VALUE NOTED.  VALUE IS CONSISTENT WITH PREVIOUSLY REPORTED AND CALLED VALUE. Performed at [facility], [place] [address], [place], [place] [address]    Culture ESCHERICHIA COLI PROTEUS MIRABILIS  (A)  Final   Report Status 03/17/2021 FINAL  Final   Organism ID, Bacteria ESCHERICHIA COLI  Final   Organism ID, Bacteria PROTEUS MIRABILIS  Final      Susceptibility   Escherichia coli - MIC*    AMPICILLIN >=32 RESISTANT Resistant     CEFAZOLIN 8 SENSITIVE Sensitive     CEFEPIME <=0.12 SENSITIVE Sensitive     CEFTAZIDIME <=1 SENSITIVE Sensitive     CEFTRIAXONE <=0.25 SENSITIVE Sensitive     CIPROFLOXACIN <=0.25 SENSITIVE Sensitive     GENTAMICIN <=1 SENSITIVE Sensitive     IMIPENEM <=0.25 SENSITIVE Sensitive     TRIMETH/SULFA <=20 SENSITIVE Sensitive     AMPICILLIN/SULBACTAM >=32 RESISTANT Resistant     PIP/TAZO <=4 SENSITIVE Sensitive     * ESCHERICHIA COLI   Proteus mirabilis - MIC*    AMPICILLIN <=2 SENSITIVE Sensitive     CEFAZOLIN <=4 SENSITIVE Sensitive     CEFEPIME <=0.12 SENSITIVE Sensitive     CEFTAZIDIME <=1 SENSITIVE Sensitive     CEFTRIAXONE <=0.25 SENSITIVE Sensitive     CIPROFLOXACIN <=0.25 SENSITIVE Sensitive     GENTAMICIN <=1 SENSITIVE Sensitive     IMIPENEM 2 SENSITIVE Sensitive     TRIMETH/SULFA <=20 SENSITIVE Sensitive     AMPICILLIN/SULBACTAM <=2 SENSITIVE Sensitive     PIP/TAZO <=4 SENSITIVE Sensitive     * PROTEUS MIRABILIS  Culture, blood (routine x 2)     Status: Abnormal   Collection Time: 03/14/21  1:53 PM   Specimen: Right  Antecubital; Blood  Result Value Ref Range Status   Specimen Description   Final    RIGHT ANTECUBITAL Performed at [facility], [place]  [address] [place], [place] [address]    Special Requests   Final    BOTTLES DRAWN AEROBIC AND ANAEROBIC Blood Culture adequate volume Performed at [place] [address] [place], [place] [address]    Culture  Setup Time   Final    GRAM NEGATIVE RODS IN BOTH AEROBIC AND ANAEROBIC BOTTLES CRITICAL RESULT CALLED TO, READ BACK BY AND VERIFIED WITH: V BRYK,PHARMD@0723  03/15/21 [place]    Culture (A)  Final    PROTEUS MIRABILIS ESCHERICHIA COLI SUSCEPTIBILITIES PERFORMED ON PREVIOUS CULTURE WITHIN THE LAST 5 DAYS. Performed at [facility], [place] [address], [place], [place] [address]    Report Status 03/17/2021 FINAL  Final  Blood Culture ID Panel (Reflexed)     Status: Abnormal   Collection Time: 03/14/21  1:53 PM  Result Value Ref Range Status   Enterococcus faecalis NOT DETECTED NOT DETECTED Final   Enterococcus Faecium NOT DETECTED NOT DETECTED Final   Listeria monocytogenes NOT DETECTED NOT DETECTED Final   Staphylococcus species NOT DETECTED NOT DETECTED Final   Staphylococcus aureus (BCID) NOT DETECTED NOT DETECTED Final   Staphylococcus epidermidis NOT DETECTED NOT DETECTED Final   Staphylococcus lugdunensis NOT DETECTED NOT DETECTED Final   Streptococcus species NOT DETECTED NOT DETECTED Final   Streptococcus agalactiae NOT DETECTED NOT DETECTED Final   Streptococcus pneumoniae NOT DETECTED NOT DETECTED Final   Streptococcus pyogenes NOT DETECTED NOT DETECTED Final   A.calcoaceticus-baumannii NOT DETECTED NOT DETECTED Final   Bacteroides fragilis NOT DETECTED NOT DETECTED Final   Enterobacterales DETECTED (A) NOT DETECTED Final    Comment: CRITICAL RESULT CALLED TO, READ BACK BY AND VERIFIED WITH: V BRYK,PHARMD@0723  03/15/21 [place]    Enterobacter cloacae complex NOT DETECTED NOT DETECTED Final    Escherichia coli DETECTED (A) NOT DETECTED Final    Comment: CRITICAL RESULT CALLED TO, READ BACK BY AND VERIFIED WITH: V BRYK,PHARMD@0723  03/15/21 [place]    Klebsiella aerogenes NOT DETECTED NOT DETECTED Final   Klebsiella oxytoca NOT DETECTED NOT DETECTED Final   Klebsiella pneumoniae NOT DETECTED NOT DETECTED Final   Proteus species DETECTED (A) NOT DETECTED Final    Comment: CRITICAL RESULT CALLED TO, READ BACK BY AND VERIFIED WITH: V BRYK,PHARMD@0723  03/15/21 [place]    Salmonella species NOT DETECTED NOT DETECTED Final   Serratia marcescens NOT DETECTED NOT DETECTED Final   Haemophilus influenzae NOT DETECTED NOT DETECTED Final   Neisseria meningitidis NOT DETECTED NOT DETECTED Final   Pseudomonas aeruginosa NOT DETECTED NOT DETECTED Final   Stenotrophomonas maltophilia NOT DETECTED NOT DETECTED Final   Candida albicans NOT DETECTED NOT DETECTED Final   Candida auris NOT DETECTED NOT DETECTED Final   Candida glabrata NOT DETECTED NOT DETECTED Final   Candida krusei NOT DETECTED NOT DETECTED Final   Candida parapsilosis NOT DETECTED NOT DETECTED Final   Candida tropicalis NOT DETECTED NOT DETECTED Final   Cryptococcus neoformans/gattii NOT DETECTED NOT DETECTED Final   CTX-M ESBL NOT DETECTED NOT DETECTED Final   Carbapenem resistance IMP NOT DETECTED NOT DETECTED Final   Carbapenem resistance KPC NOT DETECTED NOT DETECTED Final   Carbapenem resistance NDM NOT DETECTED NOT DETECTED Final   Carbapenem resist OXA 48 LIKE NOT DETECTED NOT DETECTED Final   Carbapenem resistance VIM NOT DETECTED NOT DETECTED Final    Comment: Performed at [facility], [address] [place], [place] [address]  Resp Panel by RT-PCR (Flu A&B, Covid) Nasopharyngeal Swab     Status: None   Collection Time: 03/14/21  1:54 PM   Specimen: Nasopharyngeal Swab; Nasopharyngeal(NP) swabs in vial transport medium  Result Value Ref Range Status   SARS Coronavirus 2 by RT PCR NEGATIVE NEGATIVE Final    Comment:  (NOTE) SARS-CoV-2 target nucleic acids are NOT DETECTED.  The SARS-CoV-2 RNA is generally detectable in upper respiratory specimens during the acute phase of infection. The lowest concentration of SARS-CoV-2 viral copies this assay can detect is 138 copies/mL. A negative result does not preclude SARS-Cov-2 infection and should not be used as the sole basis for treatment or other patient management decisions. A negative result may occur with  improper specimen collection/handling, submission of specimen other than nasopharyngeal swab, presence of viral mutation(s) within the areas targeted by this assay, and inadequate number of viral copies(<138 copies/mL). A negative result must be combined with clinical observations, patient history, and epidemiological information. The expected result is Negative.  Fact Sheet for Patients:  EntrepreneurPulse.com.au  Fact Sheet for Healthcare Providers:  IncredibleEmployment.be  This test is no t yet approved or cleared by the [place] FDA and  has been authorized for detection and/or diagnosis of SARS-CoV-2 by FDA under an Emergency Use Authorization (EUA). This EUA will remain  in effect (meaning this test can be used) for the duration of the COVID-19 declaration under Section 564(b)(1) of the Act, 21 U.S.C.section 360bbb-3(b)(1), unless the authorization is terminated  or revoked sooner.       Influenza A by PCR NEGATIVE NEGATIVE Final   Influenza B by PCR NEGATIVE NEGATIVE Final    Comment: (NOTE) The Xpert Xpress SARS-CoV-2/FLU/RSV plus assay is intended as an aid in the diagnosis of influenza from Nasopharyngeal swab specimens and should not be used as a sole basis for treatment. Nasal washings and aspirates are unacceptable for Xpert Xpress SARS-CoV-2/FLU/RSV testing.  Fact Sheet for Patients: EntrepreneurPulse.com.au  Fact Sheet for Healthcare  Providers: IncredibleEmployment.be  This test is not yet approved or cleared by the [place] FDA and has been authorized for detection and/or diagnosis of SARS-CoV-2 by FDA under an Emergency Use Authorization (EUA). This EUA will remain in effect (meaning this test can be used) for the duration of the COVID-19 declaration under Section 564(b)(1) of the Act, 21 U.S.C. section 360bbb-3(b)(1), unless the authorization is terminated or revoked.  Performed at [facility], [place] [address], [place], [place] [address]     Gaylan Gerold, [place] [facility] Infectious [facility] [place] [facility] [phone] pager    03/21/2021, 10:46 AM

## 2021-03-21 NOTE — Progress Notes (Signed)
                                                                                                                                                             Patient Name: Loretta Woods MRN: [id number] DOB: 07-25-1955 Referring Physician: Narda Rutherford Date of Service: 02/23/2021 [facility], [place]                                                        End Of Treatment Note  Diagnoses: C79.31-Secondary malignant neoplasm of brain  Cancer Staging: Cancer Staging Endobronchial cancer, left ([place]) Staging form: Lung, AJCC 8th Edition - Clinical: Stage IV (cT4, cN2, cM1) - Signed by Orson Slick, MD on 01/29/2021  Intent: Palliative  Radiation Treatment Dates: 02/12/2021 through 02/23/2021 Site Technique Total Dose (Gy) Dose per Fx (Gy) Completed Fx Beam Energies  Brain: Brain_HA IMRT 30/30 3 10/10 6X  Lung, Left: Lung_Lt_Lower 3D 30/30 3 10/10 6X, 10X   Narrative: The patient tolerated radiation therapy relatively well.   Plan: The patient will follow-up with radiation oncology in 1 mo.  -----------------------------------  Eppie Gibson, MD

## 2021-03-21 NOTE — Plan of Care (Signed)
  Problem: Education: Goal: Knowledge of General Education information will improve Description: Including pain rating scale, medication(s)/side effects and non-pharmacologic comfort measures Outcome: Progressing   Problem: Health Behavior/Discharge Planning: Goal: Ability to manage health-related needs will improve Outcome: Progressing   Problem: Clinical Measurements: Goal: Ability to maintain clinical measurements within normal limits will improve Outcome: Progressing Goal: Will remain free from infection Outcome: Progressing Goal: Diagnostic test results will improve Outcome: Progressing Goal: Respiratory complications will improve Outcome: Progressing Goal: Cardiovascular complication will be avoided Outcome: Progressing   Problem: Activity: Goal: Risk for activity intolerance will decrease Outcome: Progressing   Problem: Coping: Goal: Level of anxiety will decrease Outcome: Progressing   Problem: Pain Managment: Goal: General experience of comfort will improve Outcome: Progressing

## 2021-03-21 NOTE — Progress Notes (Addendum)
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  Patient apparently completed brain radiation couple of weeks ago and has been placed on chemotherapy 10 days ago.  Over the last couple of weeks she is becoming progressively weaker.  She was also occasionally confused and has been having a poor appetite in the last week. He was also found to have E. coli and Proteus bacteremia.  ID was consulted and is recommending total of 10 days of antibiotics until 9/10   Assessment & Plan:   Active Problems:   Acute metabolic encephalopathy   1-Acute metabolic encephalopathy:  Likely multifactorial possible related to brain mets as well as bacteremia MRI of the brain on admission showed multiple metastatic deposits stable since 02/06/2021, largest lesion in the right temporal lobe has a significant dural component likely due to a dural metastasis or dural invasion. Continue with Decadron and [facility] was consulted and signed off on 9/1  E. coli and Proteus bacteremia: Blood cultures positive for 8 as well as Proteus species Continue with IV ceftriaxone. ID consulted and plan is to treat with antibiotics for a total of 10 days last dose 9/10  Stage IV adenocarcinoma of the lung: Status post whole brain radiation Started on chemotherapy by Dr. Lorenso Courier 8/22  Leukocytosis;  Could be related to dexamethasone, she did receive a dose of colony-stimulating factor.  ID informed of increased white count.  Hypothyroidism: TSH 7.0 Continue with current home dose.  Need repeat TSH in 3 to 4 weeks.  Seizure Disorder: Continue with Keppra Hypokalemia: Continue to supplement. Hypertension: discontinue HCTZ due to hyponatremia. Start metoprolol.   Hypernatremia: Stable Hyperlipidemia: Continue with statins.   Pressure injury, POA: See documentation below Pressure Injury 03/15/21 Buttocks Right;Left Unstageable - Full thickness tissue loss in which the base of the injury is covered by slough (yellow, tan, gray, green or brown) and/or eschar (tan, brown or black) in the wound bed. moisture associated skin  (Active)  03/15/21   Location: Buttocks  Location Orientation: Right;Left  Staging: Unstageable - Full thickness tissue loss in which the base of the injury is covered by slough (yellow, tan, gray, green or brown) and/or eschar (tan, brown or black) in the wound bed.  Wound Description (Comments): moisture associated skin damage to bilat buttocks with patchy areas of Unstageable pressure injuries  Present on Admission: Yes     Nutrition Problem: Inadequate oral intake Etiology: cancer and cancer related treatments    Signs/Symptoms: per patient/family report, meal completion < 25%    Interventions: Ensure Enlive (each supplement provides 350kcal and 20 grams of protein), Liberalize Diet, MVI  Estimated body mass index is 16.06 kg/m as calculated from the following:   Height as of this encounter: 5\' 2"  (1.575 m).   Weight as of this encounter: 39.8 kg.   DVT prophylaxis: SCDs Code Status: DNR Family Communication: No family at bedside care discussed with patient Disposition Plan:  Status is: Inpatient  Remains inpatient appropriate because:IV treatments appropriate due to intensity of illness or inability to take PO  Dispo: The patient is from: Home              Anticipated d/c is to: SNF              Patient currently is not  medically stable to d/c.   Difficult to place patient No        Consultants:  ID   Procedures:  None  Antimicrobials:    Subjective: She is feeling better and stronger.  She denies diarrhea, no dysuria. No cough.   Objective: Vitals:   03/20/21 2353 03/21/21 0325  03/21/21 0752 03/21/21 1144  BP: (!) 165/92 (!) 150/89 (!) 155/90 (!) 147/94  Pulse: 94 98 91   Resp: 18 18 20 20   Temp: 99 F (37.2 C) 98.3 F (36.8 C) 98 F (36.7 C) 97.7 F (36.5 C)  TempSrc: Oral Oral Oral Oral  SpO2: 96% 95% 94% 95%  Weight:      Height:        Intake/Output Summary (Last 24 hours) at 03/21/2021 1458 Last data filed at 03/21/2021 1300 Gross per 24 hour  Intake 250 ml  Output --  Net 250 ml   Filed Weights   03/17/21 0015  Weight: 39.8 kg    Examination:  General exam: Appears calm and comfortable  Respiratory system: Clear to auscultation. Respiratory effort normal. Cardiovascular system: S1 & S2 heard, RRR. No JVD, murmurs, rubs, gallops or clicks. No pedal edema. Gastrointestinal system: Abdomen is nondistended, soft and nontender. No organomegaly or masses felt. Normal bowel sounds heard. Central nervous system: Alert and oriented. Following command.  Extremities: Symmetric 5 x 5 power.   Data Reviewed: I have personally reviewed following labs and imaging studies  CBC: Recent Labs  Lab 03/16/21 0332 03/17/21 0324 03/18/21 0210 03/19/21 0500 03/20/21 0558 03/21/21 0259  WBC 4.1 6.9 13.1* 17.2* 26.4* 29.0*  NEUTROABS 2.5  --  5.9  --   --   --   HGB 7.4* 7.4* 7.6* 8.0* 9.6* 8.3*  HCT 21.1* 20.7* 21.1* 22.3* 26.3* 23.3*  MCV 84.7 82.5 82.7 82.6 82.2 83.8  PLT 33* 31* 49* 80* 152 220   Basic Metabolic Panel: Recent Labs  Lab 03/15/21 0023 03/15/21 0555 03/17/21 0324 03/18/21 0210 03/19/21 0500 03/20/21 0558 03/21/21 0259  NA  --    < > 134* 133* 129* 130* 132*  K  --    < > 3.0* 4.0 3.5 3.6 3.5  CL  --    < > 99 99 91* 95* 97*  CO2  --    < > 27 28 26 25 26   GLUCOSE  --    < > 111* 124* 90 130* 117*  BUN  --    < > 18 14 9 12 19   CREATININE  --    < > 0.43* 0.32* <0.30* 0.40* 0.47  CALCIUM  --    < > 7.9* 8.0* 8.1* 8.3* 8.1*  MG 1.7  --   --  1.8  --   --   --   PHOS  --   --   --  1.6*  --   --   --    < > = values in this  interval not displayed.   GFR: Estimated Creatinine Clearance: 44.6 mL/min (by C-G formula based on SCr of 0.47 mg/dL). Liver Function Tests: Recent Labs  Lab 03/15/21 0555 03/16/21 0332 03/18/21 0210  AST 16 12* 24  ALT 23 19 20   ALKPHOS 70 53 68  BILITOT 1.3* 1.0 0.7  PROT 5.3* 4.5* 4.7*  ALBUMIN 2.2* 1.9* 2.0*   No results for input(s): LIPASE, AMYLASE in the last 168 hours. Recent Labs  Lab 03/15/21 0648  AMMONIA 27   Coagulation Profile: No results  for input(s): INR, PROTIME in the last 168 hours. Cardiac Enzymes: No results for input(s): CKTOTAL, CKMB, CKMBINDEX, TROPONINI in the last 168 hours. BNP (last 3 results) No results for input(s): PROBNP in the last 8760 hours. HbA1C: No results for input(s): HGBA1C in the last 72 hours. CBG: No results for input(s): GLUCAP in the last 168 hours. Lipid Profile: No results for input(s): CHOL, HDL, LDLCALC, TRIG, CHOLHDL, LDLDIRECT in the last 72 hours. Thyroid Function Tests: No results for input(s): TSH, T4TOTAL, FREET4, T3FREE, THYROIDAB in the last 72 hours. Anemia Panel: No results for input(s): VITAMINB12, FOLATE, FERRITIN, TIBC, IRON, RETICCTPCT in the last 72 hours. Sepsis Labs: Recent Labs  Lab 03/15/21 0023  LATICACIDVEN 1.2    Recent Results (from the past 240 hour(s))  Culture, blood (routine x 2)     Status: Abnormal   Collection Time: 03/14/21  1:43 PM   Specimen: Left Antecubital; Blood  Result Value Ref Range Status   Specimen Description   Final    LEFT ANTECUBITAL Performed at [facility] [address], [place], [place] [address]    Special Requests   Final    BOTTLES DRAWN AEROBIC AND ANAEROBIC Blood Culture adequate volume Performed at [facility] [address], [place], [place] [address]    Culture  Setup Time   Final    GRAM NEGATIVE RODS IN BOTH AEROBIC AND ANAEROBIC BOTTLES CRITICAL VALUE NOTED.  VALUE IS CONSISTENT WITH PREVIOUSLY REPORTED  AND CALLED VALUE. Performed at [facility], [place] [address], [place], [place] [address]    Culture ESCHERICHIA COLI PROTEUS MIRABILIS  (A)  Final   Report Status 03/17/2021 FINAL  Final   Organism ID, Bacteria ESCHERICHIA COLI  Final   Organism ID, Bacteria PROTEUS MIRABILIS  Final      Susceptibility   Escherichia coli - MIC*    AMPICILLIN >=32 RESISTANT Resistant     CEFAZOLIN 8 SENSITIVE Sensitive     CEFEPIME <=0.12 SENSITIVE Sensitive     CEFTAZIDIME <=1 SENSITIVE Sensitive     CEFTRIAXONE <=0.25 SENSITIVE Sensitive     CIPROFLOXACIN <=0.25 SENSITIVE Sensitive     GENTAMICIN <=1 SENSITIVE Sensitive     IMIPENEM <=0.25 SENSITIVE Sensitive     TRIMETH/SULFA <=20 SENSITIVE Sensitive     AMPICILLIN/SULBACTAM >=32 RESISTANT Resistant     PIP/TAZO <=4 SENSITIVE Sensitive     * ESCHERICHIA COLI   Proteus mirabilis - MIC*    AMPICILLIN <=2 SENSITIVE Sensitive     CEFAZOLIN <=4 SENSITIVE Sensitive     CEFEPIME <=0.12 SENSITIVE Sensitive     CEFTAZIDIME <=1 SENSITIVE Sensitive     CEFTRIAXONE <=0.25 SENSITIVE Sensitive     CIPROFLOXACIN <=0.25 SENSITIVE Sensitive     GENTAMICIN <=1 SENSITIVE Sensitive     IMIPENEM 2 SENSITIVE Sensitive     TRIMETH/SULFA <=20 SENSITIVE Sensitive     AMPICILLIN/SULBACTAM <=2 SENSITIVE Sensitive     PIP/TAZO <=4 SENSITIVE Sensitive     * PROTEUS MIRABILIS  Culture, blood (routine x 2)     Status: Abnormal   Collection Time: 03/14/21  1:53 PM   Specimen: Right Antecubital; Blood  Result Value Ref Range Status   Specimen Description   Final    RIGHT ANTECUBITAL Performed at [facility], [place] [address] [place], [place] [address]    Special Requests   Final    BOTTLES DRAWN AEROBIC AND ANAEROBIC Blood Culture adequate volume Performed at [facility]  [address], [place], [place] [address]    Culture  Setup Time   Final    GRAM NEGATIVE RODS IN BOTH AEROBIC AND ANAEROBIC BOTTLES CRITICAL RESULT  CALLED TO, READ BACK BY AND VERIFIED WITH: V BRYK,PHARMD@0723  03/15/21 [place]    Culture (A)  Final    PROTEUS MIRABILIS ESCHERICHIA COLI SUSCEPTIBILITIES PERFORMED ON PREVIOUS CULTURE WITHIN THE LAST 5 DAYS. Performed at [facility], [place] [address] [place], [place] [address]    Report Status 03/17/2021 FINAL  Final  Blood Culture ID Panel (Reflexed)     Status: Abnormal   Collection Time: 03/14/21  1:53 PM  Result Value Ref Range Status   Enterococcus faecalis NOT DETECTED NOT DETECTED Final   Enterococcus Faecium NOT DETECTED NOT DETECTED Final   Listeria monocytogenes NOT DETECTED NOT DETECTED Final   Staphylococcus species NOT DETECTED NOT DETECTED Final   Staphylococcus aureus (BCID) NOT DETECTED NOT DETECTED Final   Staphylococcus epidermidis NOT DETECTED NOT DETECTED Final   Staphylococcus lugdunensis NOT DETECTED NOT DETECTED Final   Streptococcus species NOT DETECTED NOT DETECTED Final   Streptococcus agalactiae NOT DETECTED NOT DETECTED Final   Streptococcus pneumoniae NOT DETECTED NOT DETECTED Final   Streptococcus pyogenes NOT DETECTED NOT DETECTED Final   A.calcoaceticus-baumannii NOT DETECTED NOT DETECTED Final   Bacteroides fragilis NOT DETECTED NOT DETECTED Final   Enterobacterales DETECTED (A) NOT DETECTED Final    Comment: CRITICAL RESULT CALLED TO, READ BACK BY AND VERIFIED WITH: V BRYK,PHARMD@0723  03/15/21 [place]    Enterobacter cloacae complex NOT DETECTED NOT DETECTED Final   Escherichia coli DETECTED (A) NOT DETECTED Final    Comment: CRITICAL RESULT CALLED TO, READ BACK BY AND VERIFIED WITH: V BRYK,PHARMD@0723  03/15/21 [place]    Klebsiella aerogenes NOT DETECTED NOT DETECTED Final   Klebsiella oxytoca NOT DETECTED NOT DETECTED Final   Klebsiella pneumoniae NOT DETECTED NOT DETECTED Final   Proteus species DETECTED (A) NOT DETECTED Final    Comment: CRITICAL RESULT CALLED TO, READ BACK BY AND VERIFIED WITH: V BRYK,PHARMD@0723  03/15/21 [place]    Salmonella  species NOT DETECTED NOT DETECTED Final   Serratia marcescens NOT DETECTED NOT DETECTED Final   Haemophilus influenzae NOT DETECTED NOT DETECTED Final   Neisseria meningitidis NOT DETECTED NOT DETECTED Final   Pseudomonas aeruginosa NOT DETECTED NOT DETECTED Final   Stenotrophomonas maltophilia NOT DETECTED NOT DETECTED Final   Candida albicans NOT DETECTED NOT DETECTED Final   Candida auris NOT DETECTED NOT DETECTED Final   Candida glabrata NOT DETECTED NOT DETECTED Final   Candida krusei NOT DETECTED NOT DETECTED Final   Candida parapsilosis NOT DETECTED NOT DETECTED Final   Candida tropicalis NOT DETECTED NOT DETECTED Final   Cryptococcus neoformans/gattii NOT DETECTED NOT DETECTED Final   CTX-M ESBL NOT DETECTED NOT DETECTED Final   Carbapenem resistance IMP NOT DETECTED NOT DETECTED Final   Carbapenem resistance KPC NOT DETECTED NOT DETECTED Final   Carbapenem resistance NDM NOT DETECTED NOT DETECTED Final   Carbapenem resist OXA 48 LIKE NOT DETECTED NOT DETECTED Final   Carbapenem resistance VIM NOT DETECTED NOT DETECTED Final    Comment: Performed at [facility], [address], [place], [place] [address]  Resp Panel by RT-PCR (Flu A&B, Covid) Nasopharyngeal Swab     Status: None   Collection Time: 03/14/21  1:54 PM   Specimen: Nasopharyngeal Swab; Nasopharyngeal(NP) swabs in vial transport medium  Result Value Ref Range Status   SARS Coronavirus 2 by RT PCR NEGATIVE NEGATIVE Final  Comment: (NOTE) SARS-CoV-2 target nucleic acids are NOT DETECTED.  The SARS-CoV-2 RNA is generally detectable in upper respiratory specimens during the acute phase of infection. The lowest concentration of SARS-CoV-2 viral copies this assay can detect is 138 copies/mL. A negative result does not preclude SARS-Cov-2 infection and should not be used as the sole basis for treatment or other patient management decisions. A negative result may occur with  improper specimen collection/handling,  submission of specimen other than nasopharyngeal swab, presence of viral mutation(s) within the areas targeted by this assay, and inadequate number of viral copies(<138 copies/mL). A negative result must be combined with clinical observations, patient history, and epidemiological information. The expected result is Negative.  Fact Sheet for Patients:  EntrepreneurPulse.com.au  Fact Sheet for Healthcare Providers:  IncredibleEmployment.be  This test is no t yet approved or cleared by the [place] FDA and  has been authorized for detection and/or diagnosis of SARS-CoV-2 by FDA under an Emergency Use Authorization (EUA). This EUA will remain  in effect (meaning this test can be used) for the duration of the COVID-19 declaration under Section 564(b)(1) of the Act, 21 U.S.C.section 360bbb-3(b)(1), unless the authorization is terminated  or revoked sooner.       Influenza A by PCR NEGATIVE NEGATIVE Final   Influenza B by PCR NEGATIVE NEGATIVE Final    Comment: (NOTE) The Xpert Xpress SARS-CoV-2/FLU/RSV plus assay is intended as an aid in the diagnosis of influenza from Nasopharyngeal swab specimens and should not be used as a sole basis for treatment. Nasal washings and aspirates are unacceptable for Xpert Xpress SARS-CoV-2/FLU/RSV testing.  Fact Sheet for Patients: EntrepreneurPulse.com.au  Fact Sheet for Healthcare Providers: IncredibleEmployment.be  This test is not yet approved or cleared by the [place] FDA and has been authorized for detection and/or diagnosis of SARS-CoV-2 by FDA under an Emergency Use Authorization (EUA). This EUA will remain in effect (meaning this test can be used) for the duration of the COVID-19 declaration under Section 564(b)(1) of the Act, 21 U.S.C. section 360bbb-3(b)(1), unless the authorization is terminated or revoked.  Performed at [facility], [place] [address], [place], [place] [address]          Radiology Studies: No results found.      Scheduled Meds:  atorvastatin  10 mg Oral Daily   Chlorhexidine Gluconate Cloth  6 each Topical Daily   dexamethasone  4 mg Oral BID   feeding supplement  237 mL Oral BID BM   folic acid  1 mg Oral Daily   Gerhardt's butt cream   Topical QID   hydrochlorothiazide  25 mg Oral Daily   levETIRAcetam  500 mg Oral BID   levothyroxine  75 mcg Oral Q0600   magic mouthwash  10 mL Oral QID   mouth rinse  15 mL Mouth Rinse BID   multivitamin with minerals  1 tablet Oral Daily   potassium chloride  40 mEq Oral Daily   sodium chloride flush  10-40 mL Intracatheter Q12H   thiamine injection  100 mg Intravenous Daily   Or   thiamine  100 mg Oral Daily   Continuous Infusions:  cefTRIAXone (ROCEPHIN)  IV 2 g (03/21/21 0820)     LOS: 7 days    Time spent: 35 minutes.     Elmarie Shiley, MD [facility]   If 7PM-7AM, please contact night-coverage [email or web address]  03/21/2021, 2:58 PM

## 2021-03-22 DIAGNOSIS — R7881 Bacteremia: Secondary | ICD-10-CM | POA: Diagnosis not present

## 2021-03-22 DIAGNOSIS — D61818 Other pancytopenia: Secondary | ICD-10-CM | POA: Diagnosis not present

## 2021-03-22 LAB — BASIC METABOLIC PANEL
Anion gap: 6 (ref 5–15)
BUN: 17 mg/dL (ref 8–23)
CO2: 28 mmol/L (ref 22–32)
Calcium: 8 mg/dL — ABNORMAL LOW (ref 8.9–10.3)
Chloride: 99 mmol/L (ref 98–111)
Creatinine, Ser: 0.4 mg/dL — ABNORMAL LOW (ref 0.44–1.00)
GFR, Estimated: >60 mL/min (ref >60)
Glucose, Bld: 91 mg/dL (ref 70–99)
Potassium: 3 mmol/L — ABNORMAL LOW (ref 3.5–5.1)
Sodium: 133 mmol/L — ABNORMAL LOW (ref 135–145)

## 2021-03-22 LAB — CBC
HCT: 22.7 % — ABNORMAL LOW (ref 36.0–46.0)
Hemoglobin: 7.7 g/dL — ABNORMAL LOW (ref 12.0–15.0)
MCH: 29.2 pg (ref 26.0–34.0)
MCHC: 33.9 g/dL (ref 30.0–36.0)
MCV: 86 fL (ref 80.0–100.0)
Platelets: 204 10*3/uL (ref 150–400)
RBC: 2.64 MIL/uL — ABNORMAL LOW (ref 3.87–5.11)
RDW: 12.6 % (ref 11.5–15.5)
WBC: 28.4 10*3/uL — ABNORMAL HIGH (ref 4.0–10.5)
nRBC: 3.2 % — ABNORMAL HIGH (ref 0.0–0.2)

## 2021-03-22 MED ORDER — POTASSIUM CHLORIDE CRYS ER 20 MEQ PO TBCR
40.0000 meq | EXTENDED_RELEASE_TABLET | Freq: Once | ORAL | Status: AC
  Administered 2021-03-22: 40 meq via ORAL
  Filled 2021-03-22: qty 2

## 2021-03-22 NOTE — Progress Notes (Signed)
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  Patient apparently completed brain radiation couple of weeks ago and has been placed on chemotherapy 10 days ago.  Over the last couple of weeks she is becoming progressively weaker.  She was also occasionally confused and has been having a poor appetite in the last week. He was also found to have E. coli and Proteus bacteremia.  ID was consulted and is recommending total of 10 days of antibiotics until 9/10   Assessment & Plan:   Active Problems:   Acute metabolic encephalopathy   Bacteremia   1-Acute metabolic encephalopathy:  Likely multifactorial possible related to brain mets as well as bacteremia MRI of the brain on admission showed multiple metastatic deposits stable since 02/06/2021, largest lesion in the right temporal lobe has a significant dural component likely due to a dural metastasis or dural invasion. Continue with Decadron and [facility] was consulted and signed off on 9/1 Im proved.   E. coli and Proteus bacteremia: Blood cultures positive for 8 as well as Proteus species Continue with IV ceftriaxone. ID consulted and plan is to treat with antibiotics for a total of 10 days last dose 9/10 Discussed with Dr Linus Salmons, [facility] to transition to oral Keflex 500 mg QID at discharge to complete course.   Stage IV adenocarcinoma of the lung: Status post whole brain radiation Started on chemotherapy by Dr. Lorenso Courier 8/22  Leukocytosis;  Could be related to dexamethasone, she did receive a dose of colony-stimulating factor.  ID informed of increased white count. No change in management.  Related to steroids. No other sign of infection.   Hypothyroidism: TSH 7.0 Continue with  current home dose.  Need repeat TSH in 3 to 4 weeks.  Seizure Disorder: Continue with Keppra Hypokalemia: Continue to supplement. Hypertension: Discontinue HCTZ due to hyponatremia. Started  metoprolol.  Hyponatremia: Stable Hyperlipidemia: Continue with statins.   Pressure injury, POA: See documentation below Pressure Injury 03/15/21 Buttocks Right;Left Unstageable - Full thickness tissue loss in which the base of the injury is covered by slough (yellow, tan, gray, green or brown) and/or eschar (tan, brown or black) in the wound bed. moisture associated skin  (Active)  03/15/21   Location: Buttocks  Location Orientation: Right;Left  Staging: Unstageable - Full thickness tissue loss in which the base of the injury is covered by slough (yellow, tan, gray, green or brown) and/or eschar (tan, brown or black) in the wound bed.  Wound Description (Comments): moisture associated skin damage to bilat buttocks with patchy areas of Unstageable pressure injuries  Present on Admission: Yes     Nutrition Problem: Inadequate oral intake Etiology: cancer and cancer related treatments    Signs/Symptoms: per patient/family report, meal completion < 25%    Interventions: Ensure Enlive (each supplement provides 350kcal and 20 grams of protein), Liberalize Diet, MVI  Estimated body mass index is 16.06 kg/m as calculated from the following:   Height as of this encounter: 5\' 2"  (1.575 m).   Weight as of this encounter: 39.8 kg.   DVT prophylaxis: SCDs Code Status: DNR Family Communication: No family at bedside care discussed with patient Disposition Plan:  Status is: Inpatient  Remains inpatient appropriate because:IV treatments appropriate due to intensity of illness or inability to take PO  Dispo:  The patient is from: Home              Anticipated d/c is to: SNF              Patient currently is not medically stable to d/c.   Difficult to place patient No        Consultants:   ID   Procedures:  None  Antimicrobials:    Subjective: She is feeling better, alert and oriented.  Denies cough, diarrhea.   Objective: Vitals:   03/21/21 1710 03/21/21 2023 03/21/21 2353 03/22/21 0832  BP: (!) 152/91 (!) 157/92 (!) 151/102 (!) 164/79  Pulse: 99 90 70 73  Resp: 18 18 18 20   Temp: 99.1 F (37.3 C) 98.2 F (36.8 C) 98.8 F (37.1 C) 97.8 F (36.6 C)  TempSrc: Oral Oral Oral Oral  SpO2: 95% 95% 94% 97%  Weight:      Height:        Intake/Output Summary (Last 24 hours) at 03/22/2021 1256 Last data filed at 03/21/2021 1833 Gross per 24 hour  Intake 240 ml  Output 200 ml  Net 40 ml    Filed Weights   03/17/21 0015  Weight: 39.8 kg    Examination:  General exam: NAD Respiratory system: CTA Cardiovascular system:S 1, S 2 RRR Gastrointestinal system: BS present, soft, nt Central nervous system: Alert, oriented follows command Extremities: Symmetric power   Data Reviewed: I have personally reviewed following labs and imaging studies  CBC: Recent Labs  Lab 03/16/21 0332 03/17/21 0324 03/18/21 0210 03/19/21 0500 03/20/21 0558 03/21/21 0259 03/22/21 0656  WBC 4.1   < > 13.1* 17.2* 26.4* 29.0* 28.4*  NEUTROABS 2.5  --  5.9  --   --   --   --   HGB 7.4*   < > 7.6* 8.0* 9.6* 8.3* 7.7*  HCT 21.1*   < > 21.1* 22.3* 26.3* 23.3* 22.7*  MCV 84.7   < > 82.7 82.6 82.2 83.8 86.0  PLT 33*   < > 49* 80* 152 172 204   < > = values in this interval not displayed.    Basic Metabolic Panel: Recent Labs  Lab 03/18/21 0210 03/19/21 0500 03/20/21 0558 03/21/21 0259 03/22/21 0656  NA 133* 129* 130* 132* 133*  K 4.0 3.5 3.6 3.5 3.0*  CL 99 91* 95* 97* 99  CO2 28 26 25 26 28   GLUCOSE 124* 90 130* 117* 91  BUN 14 9 12 19 17   CREATININE 0.32* <0.30* 0.40* 0.47 0.40*  CALCIUM 8.0* 8.1* 8.3* 8.1* 8.0*  MG 1.8  --   --   --   --   PHOS 1.6*  --   --   --   --     GFR: Estimated Creatinine Clearance: 44.6 mL/min (A) (by C-G formula based on SCr of  0.4 mg/dL (L)). Liver Function Tests: Recent Labs  Lab 03/16/21 0332 03/18/21 0210  AST 12* 24  ALT 19 20  ALKPHOS 53 68  BILITOT 1.0 0.7  PROT 4.5* 4.7*  ALBUMIN 1.9* 2.0*    No results for input(s): LIPASE, AMYLASE in the last 168 hours. No results for input(s): AMMONIA in the last 168 hours.  Coagulation Profile: No results for input(s): INR, PROTIME in the last 168 hours. Cardiac Enzymes: No results for input(s): CKTOTAL, CKMB, CKMBINDEX, TROPONINI in the last 168 hours. BNP (last 3 results) No results for input(s): PROBNP in the last 8760 hours. HbA1C: No results for input(s): HGBA1C in  the last 72 hours. CBG: No results for input(s): GLUCAP in the last 168 hours. Lipid Profile: No results for input(s): CHOL, HDL, LDLCALC, TRIG, CHOLHDL, LDLDIRECT in the last 72 hours. Thyroid Function Tests: No results for input(s): TSH, T4TOTAL, FREET4, T3FREE, THYROIDAB in the last 72 hours. Anemia Panel: No results for input(s): VITAMINB12, FOLATE, FERRITIN, TIBC, IRON, RETICCTPCT in the last 72 hours. Sepsis Labs: No results for input(s): PROCALCITON, LATICACIDVEN in the last 168 hours.   Recent Results (from the past 240 hour(s))  Culture, blood (routine x 2)     Status: Abnormal   Collection Time: 03/14/21  1:43 PM   Specimen: Left Antecubital; Blood  Result Value Ref Range Status   Specimen Description   Final    LEFT ANTECUBITAL Performed at [facility] [address], [place], [place] [address]    Special Requests   Final    BOTTLES DRAWN AEROBIC AND ANAEROBIC Blood Culture adequate volume Performed at [facility] [address], [place], [place] [address]    Culture  Setup Time   Final    GRAM NEGATIVE RODS IN BOTH AEROBIC AND ANAEROBIC BOTTLES CRITICAL VALUE NOTED.  VALUE IS CONSISTENT WITH PREVIOUSLY REPORTED AND CALLED VALUE. Performed at [facility], [place] [address] [place], [place] [address]    Culture  ESCHERICHIA COLI PROTEUS MIRABILIS  (A)  Final   Report Status 03/17/2021 FINAL  Final   Organism ID, Bacteria ESCHERICHIA COLI  Final   Organism ID, Bacteria PROTEUS MIRABILIS  Final      Susceptibility   Escherichia coli - MIC*    AMPICILLIN >=32 RESISTANT Resistant     CEFAZOLIN 8 SENSITIVE Sensitive     CEFEPIME <=0.12 SENSITIVE Sensitive     CEFTAZIDIME <=1 SENSITIVE Sensitive     CEFTRIAXONE <=0.25 SENSITIVE Sensitive     CIPROFLOXACIN <=0.25 SENSITIVE Sensitive     GENTAMICIN <=1 SENSITIVE Sensitive     IMIPENEM <=0.25 SENSITIVE Sensitive     TRIMETH/SULFA <=20 SENSITIVE Sensitive     AMPICILLIN/SULBACTAM >=32 RESISTANT Resistant     PIP/TAZO <=4 SENSITIVE Sensitive     * ESCHERICHIA COLI   Proteus mirabilis - MIC*    AMPICILLIN <=2 SENSITIVE Sensitive     CEFAZOLIN <=4 SENSITIVE Sensitive     CEFEPIME <=0.12 SENSITIVE Sensitive     CEFTAZIDIME <=1 SENSITIVE Sensitive     CEFTRIAXONE <=0.25 SENSITIVE Sensitive     CIPROFLOXACIN <=0.25 SENSITIVE Sensitive     GENTAMICIN <=1 SENSITIVE Sensitive     IMIPENEM 2 SENSITIVE Sensitive     TRIMETH/SULFA <=20 SENSITIVE Sensitive     AMPICILLIN/SULBACTAM <=2 SENSITIVE Sensitive     PIP/TAZO <=4 SENSITIVE Sensitive     * PROTEUS MIRABILIS  Culture, blood (routine x 2)     Status: Abnormal   Collection Time: 03/14/21  1:53 PM   Specimen: Right Antecubital; Blood  Result Value Ref Range Status   Specimen Description   Final    RIGHT ANTECUBITAL Performed at [facility], [place] [address], [place], [place] [address]    Special Requests   Final    BOTTLES DRAWN AEROBIC AND ANAEROBIC Blood Culture adequate volume Performed at [facility] [address], [place], [place] [address]    Culture  Setup Time   Final    GRAM NEGATIVE RODS IN BOTH AEROBIC AND ANAEROBIC BOTTLES CRITICAL RESULT CALLED TO, READ BACK BY AND VERIFIED WITH: V BRYK,PHARMD@0723  03/15/21 [place]  Culture (A)  Final     PROTEUS MIRABILIS ESCHERICHIA COLI SUSCEPTIBILITIES PERFORMED ON PREVIOUS CULTURE WITHIN THE LAST 5 DAYS. Performed at [facility], [place] [address], [place], [place] [address]    Report Status 03/17/2021 FINAL  Final  Blood Culture ID Panel (Reflexed)     Status: Abnormal   Collection Time: 03/14/21  1:53 PM  Result Value Ref Range Status   Enterococcus faecalis NOT DETECTED NOT DETECTED Final   Enterococcus Faecium NOT DETECTED NOT DETECTED Final   Listeria monocytogenes NOT DETECTED NOT DETECTED Final   Staphylococcus species NOT DETECTED NOT DETECTED Final   Staphylococcus aureus (BCID) NOT DETECTED NOT DETECTED Final   Staphylococcus epidermidis NOT DETECTED NOT DETECTED Final   Staphylococcus lugdunensis NOT DETECTED NOT DETECTED Final   Streptococcus species NOT DETECTED NOT DETECTED Final   Streptococcus agalactiae NOT DETECTED NOT DETECTED Final   Streptococcus pneumoniae NOT DETECTED NOT DETECTED Final   Streptococcus pyogenes NOT DETECTED NOT DETECTED Final   A.calcoaceticus-baumannii NOT DETECTED NOT DETECTED Final   Bacteroides fragilis NOT DETECTED NOT DETECTED Final   Enterobacterales DETECTED (A) NOT DETECTED Final    Comment: CRITICAL RESULT CALLED TO, READ BACK BY AND VERIFIED WITH: V BRYK,PHARMD@0723  03/15/21 [place]    Enterobacter cloacae complex NOT DETECTED NOT DETECTED Final   Escherichia coli DETECTED (A) NOT DETECTED Final    Comment: CRITICAL RESULT CALLED TO, READ BACK BY AND VERIFIED WITH: V BRYK,PHARMD@0723  03/15/21 [place]    Klebsiella aerogenes NOT DETECTED NOT DETECTED Final   Klebsiella oxytoca NOT DETECTED NOT DETECTED Final   Klebsiella pneumoniae NOT DETECTED NOT DETECTED Final   Proteus species DETECTED (A) NOT DETECTED Final    Comment: CRITICAL RESULT CALLED TO, READ BACK BY AND VERIFIED WITH: V BRYK,PHARMD@0723  03/15/21 [facility]    Salmonella species NOT DETECTED NOT DETECTED Final   Serratia marcescens NOT DETECTED NOT DETECTED Final    Haemophilus influenzae NOT DETECTED NOT DETECTED Final   Neisseria meningitidis NOT DETECTED NOT DETECTED Final   Pseudomonas aeruginosa NOT DETECTED NOT DETECTED Final   Stenotrophomonas maltophilia NOT DETECTED NOT DETECTED Final   Candida albicans NOT DETECTED NOT DETECTED Final   Candida auris NOT DETECTED NOT DETECTED Final   Candida glabrata NOT DETECTED NOT DETECTED Final   Candida krusei NOT DETECTED NOT DETECTED Final   Candida parapsilosis NOT DETECTED NOT DETECTED Final   Candida tropicalis NOT DETECTED NOT DETECTED Final   Cryptococcus neoformans/gattii NOT DETECTED NOT DETECTED Final   CTX-M ESBL NOT DETECTED NOT DETECTED Final   Carbapenem resistance IMP NOT DETECTED NOT DETECTED Final   Carbapenem resistance KPC NOT DETECTED NOT DETECTED Final   Carbapenem resistance NDM NOT DETECTED NOT DETECTED Final   Carbapenem resist OXA 48 LIKE NOT DETECTED NOT DETECTED Final   Carbapenem resistance VIM NOT DETECTED NOT DETECTED Final    Comment: Performed at [facility], [address] [place], [place] [address]  Resp Panel by RT-PCR (Flu A&B, Covid) Nasopharyngeal Swab     Status: None   Collection Time: 03/14/21  1:54 PM   Specimen: Nasopharyngeal Swab; Nasopharyngeal(NP) swabs in vial transport medium  Result Value Ref Range Status   SARS Coronavirus 2 by RT PCR NEGATIVE NEGATIVE Final    Comment: (NOTE) SARS-CoV-2 target nucleic acids are NOT DETECTED.  The SARS-CoV-2 RNA is generally detectable in upper respiratory specimens during the acute phase of infection. The lowest concentration of SARS-CoV-2 viral copies this assay can detect is 138 copies/mL. A negative  result does not preclude SARS-Cov-2 infection and should not be used as the sole basis for treatment or other patient management decisions. A negative result may occur with  improper specimen collection/handling, submission of specimen other than nasopharyngeal swab, presence of viral mutation(s) within  the areas targeted by this assay, and inadequate number of viral copies(<138 copies/mL). A negative result must be combined with clinical observations, patient history, and epidemiological information. The expected result is Negative.  Fact Sheet for Patients:  EntrepreneurPulse.com.au  Fact Sheet for Healthcare Providers:  IncredibleEmployment.be  This test is no t yet approved or cleared by the [place] FDA and  has been authorized for detection and/or diagnosis of SARS-CoV-2 by FDA under an Emergency Use Authorization (EUA). This EUA will remain  in effect (meaning this test can be used) for the duration of the COVID-19 declaration under Section 564(b)(1) of the Act, 21 U.S.C.section 360bbb-3(b)(1), unless the authorization is terminated  or revoked sooner.       Influenza A by PCR NEGATIVE NEGATIVE Final   Influenza B by PCR NEGATIVE NEGATIVE Final    Comment: (NOTE) The Xpert Xpress SARS-CoV-2/FLU/RSV plus assay is intended as an aid in the diagnosis of influenza from Nasopharyngeal swab specimens and should not be used as a sole basis for treatment. Nasal washings and aspirates are unacceptable for Xpert Xpress SARS-CoV-2/FLU/RSV testing.  Fact Sheet for Patients: EntrepreneurPulse.com.au  Fact Sheet for Healthcare Providers: IncredibleEmployment.be  This test is not yet approved or cleared by the [place] FDA and has been authorized for detection and/or diagnosis of SARS-CoV-2 by FDA under an Emergency Use Authorization (EUA). This EUA will remain in effect (meaning this test can be used) for the duration of the COVID-19 declaration under Section 564(b)(1) of the Act, 21 U.S.C. section 360bbb-3(b)(1), unless the authorization is terminated or revoked.  Performed at [facility], [place] [address], [place], [place] [address]           Radiology Studies: No  results found.      Scheduled Meds:  atorvastatin  10 mg Oral Daily   Chlorhexidine Gluconate Cloth  6 each Topical Daily   dexamethasone  4 mg Oral BID   feeding supplement  237 mL Oral BID BM   folic acid  1 mg Oral Daily   Gerhardt's butt cream   Topical QID   levETIRAcetam  500 mg Oral BID   levothyroxine  75 mcg Oral Q0600   magic mouthwash  10 mL Oral QID   mouth rinse  15 mL Mouth Rinse BID   metoprolol tartrate  25 mg Oral BID   multivitamin with minerals  1 tablet Oral Daily   potassium chloride  40 mEq Oral Daily   potassium chloride  40 mEq Oral Once   sodium chloride flush  10-40 mL Intracatheter Q12H   thiamine injection  100 mg Intravenous Daily   Or   thiamine  100 mg Oral Daily   Continuous Infusions:  cefTRIAXone (ROCEPHIN)  IV 2 g (03/22/21 0827)     LOS: 8 days    Time spent: 35 minutes.     Elmarie Shiley, MD [facility]   If 7PM-7AM, please contact night-coverage [email or web address]  03/22/2021, 12:56 PM

## 2021-03-22 NOTE — Plan of Care (Signed)
  Problem: Education: Goal: Knowledge of disease or condition will improve Outcome: Progressing Goal: Knowledge of secondary prevention will improve Outcome: Progressing Goal: Knowledge of patient specific risk factors addressed and post discharge goals established will improve Outcome: Progressing   Problem: Coping: Goal: Will verbalize positive feelings about self Outcome: Progressing Goal: Will identify appropriate support needs Outcome: Progressing   Problem: Health Behavior/Discharge Planning: Goal: Ability to manage health-related needs will improve Outcome: Progressing   Problem: Self-Care: Goal: Ability to participate in self-care as condition permits will improve Outcome: Progressing Goal: Verbalization of feelings and concerns over difficulty with self-care will improve Outcome: Progressing Goal: Ability to communicate needs accurately will improve Outcome: Progressing   Problem: Nutrition: Goal: Risk of aspiration will decrease Outcome: Progressing Goal: Dietary intake will improve Outcome: Progressing   Problem: Intracerebral Hemorrhage Tissue Perfusion: Goal: Complications of Intracerebral Hemorrhage will be minimized Outcome: Progressing   Problem: Ischemic Stroke/TIA Tissue Perfusion: Goal: Complications of ischemic stroke/TIA will be minimized Outcome: Progressing   Problem: Spontaneous Subarachnoid Hemorrhage Tissue Perfusion: Goal: Complications of Spontaneous Subarachnoid Hemorrhage will be minimized Outcome: Progressing   Problem: Health Behavior/Discharge Planning: Goal: Ability to manage health-related needs will improve Outcome: Progressing   Problem: Education: Goal: Knowledge of General Education information will improve Description: Including pain rating scale, medication(s)/side effects and non-pharmacologic comfort measures Outcome: Progressing   Problem: Clinical Measurements: Goal: Ability to maintain clinical measurements within  normal limits will improve Outcome: Progressing Goal: Will remain free from infection Outcome: Progressing Goal: Diagnostic test results will improve Outcome: Progressing Goal: Respiratory complications will improve Outcome: Progressing Goal: Cardiovascular complication will be avoided Outcome: Progressing   Problem: Health Behavior/Discharge Planning: Goal: Ability to manage health-related needs will improve Outcome: Progressing   Problem: Activity: Goal: Risk for activity intolerance will decrease Outcome: Progressing   Problem: Nutrition: Goal: Adequate nutrition will be maintained Outcome: Progressing   Problem: Coping: Goal: Level of anxiety will decrease Outcome: Progressing   Problem: Pain Managment: Goal: General experience of comfort will improve Outcome: Progressing   Problem: Safety: Goal: Ability to remain free from injury will improve Outcome: Progressing   Problem: Skin Integrity: Goal: Risk for impaired skin integrity will decrease Outcome: Progressing

## 2021-03-22 NOTE — Progress Notes (Signed)
Nutrition Follow-up  DOCUMENTATION CODES:  Not applicable  INTERVENTION:  -Recommend diet liberalization given poor po intake  -Ensure Enlive po TID, each supplement provides 350 kcal and 20 grams of protein -Continue MVI with minerals daily  NUTRITION DIAGNOSIS:  Inadequate oral intake related to cancer and cancer related treatments as evidenced by per patient/family report, meal completion < 25%. - ongoing  GOAL:  Patient will meet greater than or equal to 90% of their needs  - not met  MONITOR:  PO intake, Supplement acceptance, Labs, Weight trends, I & O's, Skin  REASON FOR ASSESSMENT:  Malnutrition Screening Tool    ASSESSMENT:  Pt with PMH significant for stage IV lung Ca with brain metastasis, HTN, HLD, hypothyroidism, seizures admitted with bacteremia and acute metabolic encephalopathy-likely multifactorial, possibly related to the brain mets.  Per MD, acute metabolic encephalopathy has improved.   Pt reports feeling better, though PO intake continues to be inadequate. Last 7 meal completions documented as 0-50% (~22% average meal intake). Per RN, pt typically doing well with oral nutrition supplement (Ensure Enlive/Plus BID)  Medications: decadron, folvite, mvi with minerals, klor-con, thiamine, IV abx Labs: Recent Labs  Lab 03/18/21 0210 03/19/21 0500 03/20/21 0558 03/21/21 0259 03/22/21 0656  NA 133*   < > 130* 132* 133*  K 4.0   < > 3.6 3.5 3.0*  CL 99   < > 95* 97* 99  CO2 28   < > '25 26 28  ' BUN 14   < > '12 19 17  ' CREATININE 0.32*   < > 0.40* 0.47 0.40*  CALCIUM 8.0*   < > 8.3* 8.1* 8.0*  MG 1.8  --   --   --   --   PHOS 1.6*  --   --   --   --   GLUCOSE 124*   < > 130* 117* 91   < > = values in this interval not displayed.   UOP: 28m + 2 unmeasured occurrences documented x24 hours I/O: +[id number] admit  NUTRITION - FOCUSED PHYSICAL EXAM: Unable to complete at this time. Will attempt at follow-up.  Diet Order:   Diet Order              Diet Heart Room service appropriate? Yes; Fluid consistency: Thin  Diet effective now                   EDUCATION NEEDS:   No education needs have been identified at this time  Skin:  Skin Assessment: Skin Integrity Issues: Skin Integrity Issues:: Unstageable, Other (Comment) Unstageable: R/L buttocks Other: IAD R/L buttocks and into labia  Last BM:  9/7  Height:   Ht Readings from Last 1 Encounters:  03/17/21 '5\' 2"'  (1.575 m)    Weight:  Wt Readings from Last 6 Encounters:  03/17/21 39.8 kg  03/08/21 39.8 kg  03/05/21 39.7 kg  02/12/21 42.9 kg  02/07/21 42.8 kg  02/02/21 42.2 kg   BMI:  Body mass index is 16.06 kg/m.  Estimated Nutritional Needs:   Kcal:  1400-1600  Protein:  70-80 grams  Fluid:  >1.4L/d    AmLarkin InaMS, RD, LDN (she/her/hers) RD pager number and weekend/on-call pager number located in [employer]Will

## 2021-03-22 NOTE — Plan of Care (Signed)
  Problem: Education: Goal: Knowledge of disease or condition will improve Outcome: Progressing Goal: Knowledge of secondary prevention will improve Outcome: Progressing Goal: Knowledge of patient specific risk factors addressed and post discharge goals established will improve Outcome: Progressing   Problem: Coping: Goal: Will verbalize positive feelings about self Outcome: Progressing Goal: Will identify appropriate support needs Outcome: Progressing   Problem: Health Behavior/Discharge Planning: Goal: Ability to manage health-related needs will improve Outcome: Progressing   Problem: Self-Care: Goal: Ability to participate in self-care as condition permits will improve Outcome: Progressing Goal: Verbalization of feelings and concerns over difficulty with self-care will improve Outcome: Progressing Goal: Ability to communicate needs accurately will improve Outcome: Progressing   Problem: Nutrition: Goal: Risk of aspiration will decrease Outcome: Progressing Goal: Dietary intake will improve Outcome: Progressing   Problem: Intracerebral Hemorrhage Tissue Perfusion: Goal: Complications of Intracerebral Hemorrhage will be minimized Outcome: Progressing   Problem: Ischemic Stroke/TIA Tissue Perfusion: Goal: Complications of ischemic stroke/TIA will be minimized Outcome: Progressing   Problem: Spontaneous Subarachnoid Hemorrhage Tissue Perfusion: Goal: Complications of Spontaneous Subarachnoid Hemorrhage will be minimized Outcome: Progressing   Problem: Education: Goal: Knowledge of General Education information will improve Description: Including pain rating scale, medication(s)/side effects and non-pharmacologic comfort measures Outcome: Progressing   Problem: Health Behavior/Discharge Planning: Goal: Ability to manage health-related needs will improve Outcome: Progressing   Problem: Clinical Measurements: Goal: Ability to maintain clinical measurements within  normal limits will improve Outcome: Progressing Goal: Will remain free from infection Outcome: Progressing Goal: Diagnostic test results will improve Outcome: Progressing Goal: Respiratory complications will improve Outcome: Progressing Goal: Cardiovascular complication will be avoided Outcome: Progressing   Problem: Activity: Goal: Risk for activity intolerance will decrease Outcome: Progressing   Problem: Nutrition: Goal: Adequate nutrition will be maintained Outcome: Progressing   Problem: Coping: Goal: Level of anxiety will decrease Outcome: Progressing   Problem: Elimination: Goal: Will not experience complications related to bowel motility Outcome: Progressing Goal: Will not experience complications related to urinary retention Outcome: Progressing   Problem: Pain Managment: Goal: General experience of comfort will improve Outcome: Progressing   Problem: Safety: Goal: Ability to remain free from injury will improve Outcome: Progressing   Problem: Skin Integrity: Goal: Risk for impaired skin integrity will decrease Outcome: Progressing

## 2021-03-23 DIAGNOSIS — R7881 Bacteremia: Secondary | ICD-10-CM | POA: Diagnosis not present

## 2021-03-23 LAB — BASIC METABOLIC PANEL
Anion gap: 8 (ref 5–15)
BUN: 16 mg/dL (ref 8–23)
CO2: 27 mmol/L (ref 22–32)
Calcium: 8 mg/dL — ABNORMAL LOW (ref 8.9–10.3)
Chloride: 99 mmol/L (ref 98–111)
Creatinine, Ser: 0.43 mg/dL — ABNORMAL LOW (ref 0.44–1.00)
GFR, Estimated: >60 mL/min (ref >60)
Glucose, Bld: 80 mg/dL (ref 70–99)
Potassium: 3.6 mmol/L (ref 3.5–5.1)
Sodium: 134 mmol/L — ABNORMAL LOW (ref 135–145)

## 2021-03-23 LAB — CBC
HCT: 24.6 % — ABNORMAL LOW (ref 36.0–46.0)
Hemoglobin: 8.3 g/dL — ABNORMAL LOW (ref 12.0–15.0)
MCH: 30 pg (ref 26.0–34.0)
MCHC: 33.7 g/dL (ref 30.0–36.0)
MCV: 88.8 fL (ref 80.0–100.0)
Platelets: 218 10*3/uL (ref 150–400)
RBC: 2.77 MIL/uL — ABNORMAL LOW (ref 3.87–5.11)
RDW: 13.1 % (ref 11.5–15.5)
WBC: 28 10*3/uL — ABNORMAL HIGH (ref 4.0–10.5)
nRBC: 4.1 % — ABNORMAL HIGH (ref 0.0–0.2)

## 2021-03-23 NOTE — Consult Note (Signed)
WOC Nurse Consult Note: Patient receiving care in [facility] [phone]. Reason for Consult: re-assessment of buttock and perineal area wounds Wound type: Unclear etiology to me at this time. Patient has a complex medical history and has had these areas for some time now.  She also has frequent diarrheal stools making the care of these areas worse. Pressure Injury POA: Yes/No/NA Measurement: Wound bed: There are numerous ulcerated areas, the vast majority of which have pink wound beds.  There are a few smaller ones with yellow wound beds. Drainage (amount, consistency, odor)  Periwound: intact Dressing procedure/placement/frequency: Continue the Gerhardt's butt cream and antifungal powder outlined by D. Barbie Haggis in her prior visit and note.  I explained to the patient and to her nurse Caryl Pina, that the best we can hope for is that the areas don't take a substantial dive towards worsening.  It cannot be expected that they heal.  Caryl Pina explained that her family has asked for a Palliative Care consult, which I support fully.  Their expertise may lend assistance with reducing the pain associated with these areas.  [place] nurse will not follow at this time.  Please re-consult the [place] team if needed.  Val Riles, RN, MSN, CWOCN, CNS-BC, pager [phone]

## 2021-03-23 NOTE — Plan of Care (Signed)
  Problem: Education: Goal: Knowledge of disease or condition will improve Outcome: Progressing Goal: Knowledge of secondary prevention will improve Outcome: Progressing Goal: Knowledge of patient specific risk factors addressed and post discharge goals established will improve Outcome: Progressing   Problem: Coping: Goal: Will verbalize positive feelings about self Outcome: Progressing Goal: Will identify appropriate support needs Outcome: Progressing   Problem: Health Behavior/Discharge Planning: Goal: Ability to manage health-related needs will improve Outcome: Progressing   Problem: Self-Care: Goal: Ability to participate in self-care as condition permits will improve Outcome: Progressing Goal: Verbalization of feelings and concerns over difficulty with self-care will improve Outcome: Progressing Goal: Ability to communicate needs accurately will improve Outcome: Progressing   Problem: Nutrition: Goal: Risk of aspiration will decrease Outcome: Progressing Goal: Dietary intake will improve Outcome: Progressing   Problem: Intracerebral Hemorrhage Tissue Perfusion: Goal: Complications of Intracerebral Hemorrhage will be minimized Outcome: Progressing   Problem: Ischemic Stroke/TIA Tissue Perfusion: Goal: Complications of ischemic stroke/TIA will be minimized Outcome: Progressing   Problem: Spontaneous Subarachnoid Hemorrhage Tissue Perfusion: Goal: Complications of Spontaneous Subarachnoid Hemorrhage will be minimized Outcome: Progressing   Problem: Education: Goal: Knowledge of General Education information will improve Description: Including pain rating scale, medication(s)/side effects and non-pharmacologic comfort measures Outcome: Progressing   Problem: Health Behavior/Discharge Planning: Goal: Ability to manage health-related needs will improve Outcome: Progressing   Problem: Clinical Measurements: Goal: Ability to maintain clinical measurements within  normal limits will improve Outcome: Progressing Goal: Will remain free from infection Outcome: Progressing Goal: Diagnostic test results will improve Outcome: Progressing Goal: Respiratory complications will improve Outcome: Progressing Goal: Cardiovascular complication will be avoided Outcome: Progressing   Problem: Activity: Goal: Risk for activity intolerance will decrease Outcome: Progressing   Problem: Nutrition: Goal: Adequate nutrition will be maintained Outcome: Progressing   Problem: Coping: Goal: Level of anxiety will decrease Outcome: Progressing   Problem: Elimination: Goal: Will not experience complications related to bowel motility Outcome: Progressing Goal: Will not experience complications related to urinary retention Outcome: Progressing   Problem: Pain Managment: Goal: General experience of comfort will improve Outcome: Progressing   Problem: Safety: Goal: Ability to remain free from injury will improve Outcome: Progressing   Problem: Skin Integrity: Goal: Risk for impaired skin integrity will decrease Outcome: Progressing

## 2021-03-23 NOTE — Progress Notes (Signed)
Physical Therapy Treatment Patient Details Name: Loretta Woods MRN: [id number] DOB: [birth year] Today's Date: 03/23/2021    History of Present Illness 65 yo female admitted on 8/31 for progressive weakness from chemotherapy and new confusion with poor appetite.  Pt has family involved, who are aware of these issues.  Dx with acute metabolic encephalopathy from brain mets, bacteremia c mult bacteria.  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 PMHx: seizures, deconditioning, HTN, HLD, lung adenocarcinoma.    PT Comments    Pt received in supine, agreeable to therapy session with encouragement. Pt mainly limited due to fatigue and discomfort from b/l buttocks pressure injury, emphasis on pressure relief strategies (with handout given), supine/seated BLE strengthening exercises ( HEP: [email or web address] Access Code: [email or web address]), importance of continued mobility and out of bed to chair transfers at mealtimes. Pt needing increased physical assist (modA) for bed mobility and transfers today compared with previous PT session. Pt continues to benefit from PT services to progress toward functional mobility goals.   Follow Up Recommendations  SNF     Equipment Recommendations  Rolling walker with 5" wheels    Recommendations for Other Services       Precautions / Restrictions Precautions Precautions: Fall Precaution Comments: unstageable b/l pressure injury -buttocks Restrictions Weight Bearing Restrictions: No    Mobility  Bed Mobility Overal bed mobility: Needs Assistance Bed Mobility: Rolling;Sidelying to Sit Rolling: Min assist Sidelying to sit: Mod assist       General bed mobility comments: min assist to scoot legs off bed and modA for trunk rise, use of bed rail to assist and HOB partially elevated    Transfers Overall transfer level: Needs assistance Equipment used: Rolling walker (2 wheeled) Transfers: Sit to/from [employer] Sit to Stand: Min assist Stand pivot transfers: Mod assist       General transfer comment: minA to rise but modA for stand pivot transfer due to fatigue/weakness and needing manual assist to move RW         Balance Overall balance assessment: Needs assistance Sitting-balance support: Bilateral upper extremity supported Sitting balance-Leahy Scale: Fair Sitting balance - Comments: pt needs BUE support, with 0 UE support needs min guard for safety   Standing balance support: Bilateral upper extremity supported;During functional activity Standing balance-Leahy Scale: Poor Standing balance comment: min/modA for standing balance at RW, increased assist needed for dynamic standing/pivot transfer                            Cognition Arousal/Alertness: Awake/alert Behavior During Therapy: Flat affect Overall Cognitive Status: Impaired/Different from baseline Area of Impairment: Following commands;Attention;Memory;Safety/judgement;Problem solving                   Current Attention Level: Selective Memory: Decreased recall of precautions;Decreased short-term memory Following Commands: Follows one step commands with increased time     Problem Solving: Slow processing;Difficulty sequencing;Requires verbal cues General Comments: slow responses to all mobility cues and limited energy to stand; pt has difficulty following 2-step instructions (difficulty especially with contralateral hip scooting for improved chair posture and will likely need physical assist for pressure relief, pt sister [name] present and receptive to info)      Exercises Other Exercises Other Exercises: seated BLE AROM: ankle pumps, LAQ, hip flexion x10 reps ea    General Comments General comments (skin integrity, edema, etc.): HR 60 bpm resting, to low 80's bpm with exertion  Pertinent Vitals/Pain Pain Assessment: 0-10 Pain Score: 4  Pain Location: geomat cushion ordered for  room previous session, RN notified it was not in chair at time of session but recommend it be located and placed there prior to her getting up again, in lieu of pressure relief cushion pt needs to lean L/R every 15 mins for at least 60-120 seconds to relieve pressure; family present, also given pressure injury handout Pain Descriptors / Indicators: Guarding;Grimacing;Discomfort Pain Intervention(s): Limited activity within patient's tolerance;Monitored during session;Repositioned    Home Living                      Prior Function            PT Goals (current goals can now be found in the care plan section) Acute Rehab PT Goals Patient Stated Goal: to get stronger and go home, less pain on bottom PT Goal Formulation: With patient/family Time For Goal Achievement: 03/30/21 Potential to Achieve Goals: Good Progress towards PT goals: Progressing toward goals    Frequency    Min 2X/week      PT Plan Current plan remains appropriate    Co-evaluation              AM-PAC PT "6 Clicks" Mobility   Outcome Measure  Help needed turning from your back to your side while in a flat bed without using bedrails?: A Little Help needed moving from lying on your back to sitting on the side of a flat bed without using bedrails?: A Lot Help needed moving to and from a bed to a chair (including a wheelchair)?: A Lot Help needed standing up from a chair using your arms (e.g., wheelchair or bedside chair)?: A Lot Help needed to walk in hospital room?: A Lot Help needed climbing 3-5 steps with a railing? : Total 6 Click Score: 12    End of Session Equipment Utilized During Treatment: Gait belt Activity Tolerance: Patient limited by fatigue;Patient limited by pain Patient left: with call bell/phone within reach;in chair;with chair alarm set;with family/visitor present (sister present) Nurse Communication: Mobility status;Other (comment) (pt with sacral skin breakdown, check frequently  to reposition/for moisture in bed) PT Visit Diagnosis: Unsteadiness on feet (R26.81);Muscle weakness (generalized) (M62.81);Pain;Adult, failure to thrive (R62.7) Pain - Right/Left:  (bilateral) Pain - part of body:  (back/bottom)     Time: 3007-6226 PT Time Calculation (min) (ACUTE ONLY): 22 min  Charges:  $Therapeutic Activity: 8-22 mins                     Ronie Barnhart P., PTA Acute Rehabilitation Services Pager: [phone] Office: [place] 03/23/2021, 4:49 PM

## 2021-03-23 NOTE — Progress Notes (Signed)
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  Patient apparently completed brain radiation couple of weeks ago and has been placed on chemotherapy 10 days ago.  Over the last couple of weeks she is becoming progressively weaker.  She was also occasionally confused and has been having a poor appetite in the last week. He was also found to have E. coli and Proteus bacteremia.  ID was consulted and is recommending total of 10 days of antibiotics until 9/10   Assessment & Plan:   Active Problems:   Acute metabolic encephalopathy   Bacteremia   1-Acute metabolic encephalopathy:  Likely multifactorial possible related to brain mets as well as bacteremia MRI of the brain on admission showed multiple metastatic deposits stable since 02/06/2021, largest lesion in the right temporal lobe has a significant dural component likely due to a dural metastasis or dural invasion. Continue with Decadron and [facility] was consulted and signed off on 9/1 Improving.   E. coli and Proteus bacteremia: Blood cultures positive for 8 as well as Proteus species Continue with IV ceftriaxone. ID consulted and plan is to treat with antibiotics for a total of 10 days last dose 9/10 Discussed with Dr Linus Salmons, [facility] to transition to oral Keflex 500 mg QID at discharge to complete course.   Stage IV adenocarcinoma of the lung: Status post whole brain radiation Started on chemotherapy by Dr. Lorenso Courier 8/22  Leukocytosis;  Could be related to dexamethasone, she did receive a dose of colony-stimulating factor.  ID informed of increased white count. No change in management.  Related to steroids. No other sign of infection.  Stable.   Hypothyroidism: TSH  7.0 Continue with current home dose.  Need repeat TSH in 3 to 4 weeks.  Seizure Disorder: Continue with Keppra Hypokalemia: Continue to supplement. Hypertension: Discontinue HCTZ due to hyponatremia. Started  metoprolol.  Hyponatremia: Stable Hyperlipidemia: Continue with statins.   Pressure injury, POA: See documentation below Pressure Injury 03/15/21 Buttocks Right;Left Unstageable - Full thickness tissue loss in which the base of the injury is covered by slough (yellow, tan, gray, green or brown) and/or eschar (tan, brown or black) in the wound bed. moisture associated skin  (Active)  03/15/21   Location: Buttocks  Location Orientation: Right;Left  Staging: Unstageable - Full thickness tissue loss in which the base of the injury is covered by slough (yellow, tan, gray, green or brown) and/or eschar (tan, brown or black) in the wound bed.  Wound Description (Comments): moisture associated skin damage to bilat buttocks with patchy areas of Unstageable pressure injuries  Present on Admission: Yes     Nutrition Problem: Inadequate oral intake Etiology: cancer and cancer related treatments    Signs/Symptoms: per patient/family report, meal completion < 25%    Interventions: Ensure Enlive (each supplement provides 350kcal and 20 grams of protein), Liberalize Diet, MVI  Estimated body mass index is 16.06 kg/m as calculated from the following:   Height as of this encounter: 5\' 2"  (1.575 m).   Weight as of this encounter: 39.8 kg.   DVT prophylaxis: SCDs Code Status: DNR Family Communication: family 9/08 Disposition Plan:  Status is: Inpatient  Remains inpatient appropriate because:IV treatments appropriate due to intensity of illness or inability to take PO  Dispo: The patient is from: Home  Anticipated d/c is to: SNF              Patient currently is  medically stable to d/c.Marland Kitchen awaiting insurance authorization.    Difficult to place patient  No        Consultants:  ID   Procedures:  None  Antimicrobials:    Subjective: No new complaints. Denies pain   Objective: Vitals:   03/22/21 2339 03/23/21 0348 03/23/21 0839 03/23/21 1229  BP: (!) 145/74 (!) 163/82 (!) 164/85 139/70  Pulse: 64 63 76 65  Resp: 17 17 19 16   Temp: 98.2 F (36.8 C) 98.2 F (36.8 C) 98.5 F (36.9 C) 97.9 F (36.6 C)  TempSrc: Oral Oral Oral Oral  SpO2: 99% 96% 98% 97%  Weight:      Height:        Intake/Output Summary (Last 24 hours) at 03/23/2021 1459 Last data filed at 03/22/2021 1600 Gross per 24 hour  Intake --  Output 300 ml  Net -300 ml    Filed Weights   03/17/21 0015  Weight: 39.8 kg    Examination:  General exam: NAD Respiratory system: CTA Cardiovascular system S1, S 2 RRR Gastrointestinal system: BS present, soft, nt Central nervous system: Alert follows command Extremities: no edema   Data Reviewed: I have personally reviewed following labs and imaging studies  CBC: Recent Labs  Lab 03/18/21 0210 03/19/21 0500 03/20/21 0558 03/21/21 0259 03/22/21 0656 03/23/21 1140  WBC 13.1* 17.2* 26.4* 29.0* 28.4* 28.0*  NEUTROABS 5.9  --   --   --   --   --   HGB 7.6* 8.0* 9.6* 8.3* 7.7* 8.3*  HCT 21.1* 22.3* 26.3* 23.3* 22.7* 24.6*  MCV 82.7 82.6 82.2 83.8 86.0 88.8  PLT 49* 80* 152 172 204 326    Basic Metabolic Panel: Recent Labs  Lab 03/18/21 0210 03/19/21 0500 03/20/21 0558 03/21/21 0259 03/22/21 0656 03/23/21 1140  NA 133* 129* 130* 132* 133* 134*  K 4.0 3.5 3.6 3.5 3.0* 3.6  CL 99 91* 95* 97* 99 99  CO2 28 26 25 26 28 27   GLUCOSE 124* 90 130* 117* 91 80  BUN 14 9 12 19 17 16   CREATININE 0.32* <0.30* 0.40* 0.47 0.40* 0.43*  CALCIUM 8.0* 8.1* 8.3* 8.1* 8.0* 8.0*  MG 1.8  --   --   --   --   --   PHOS 1.6*  --   --   --   --   --     GFR: Estimated Creatinine Clearance: 44.6 mL/min (A) (by C-G formula based on SCr of 0.43 mg/dL (L)). Liver Function Tests: Recent Labs  Lab  03/18/21 0210  AST 24  ALT 20  ALKPHOS 68  BILITOT 0.7  PROT 4.7*  ALBUMIN 2.0*    No results for input(s): LIPASE, AMYLASE in the last 168 hours. No results for input(s): AMMONIA in the last 168 hours.  Coagulation Profile: No results for input(s): INR, PROTIME in the last 168 hours. Cardiac Enzymes: No results for input(s): CKTOTAL, CKMB, CKMBINDEX, TROPONINI in the last 168 hours. BNP (last 3 results) No results for input(s): PROBNP in the last 8760 hours. HbA1C: No results for input(s): HGBA1C in the last 72 hours. CBG: No results for input(s): GLUCAP in the last 168 hours. Lipid Profile: No results for input(s): CHOL, HDL, LDLCALC, TRIG, CHOLHDL, LDLDIRECT in the last 72 hours. Thyroid Function Tests: No results for input(s): TSH, T4TOTAL, FREET4, T3FREE, THYROIDAB in the last 72 hours.  Anemia Panel: No results for input(s): VITAMINB12, FOLATE, FERRITIN, TIBC, IRON, RETICCTPCT in the last 72 hours. Sepsis Labs: No results for input(s): PROCALCITON, LATICACIDVEN in the last 168 hours.   Recent Results (from the past 240 hour(s))  Culture, blood (routine x 2)     Status: Abnormal   Collection Time: 03/14/21  1:43 PM   Specimen: Left Antecubital; Blood  Result Value Ref Range Status   Specimen Description   Final    LEFT ANTECUBITAL Performed at [facility] [address] [place], [place] [address]    Special Requests   Final    BOTTLES DRAWN AEROBIC AND ANAEROBIC Blood Culture adequate volume Performed at [facility] [address] [place], [place] [address]    Culture  Setup Time   Final    GRAM NEGATIVE RODS IN BOTH AEROBIC AND ANAEROBIC BOTTLES CRITICAL VALUE NOTED.  VALUE IS CONSISTENT WITH PREVIOUSLY REPORTED AND CALLED VALUE. Performed at [facility], [place] [address], [place], [place] [address]    Culture ESCHERICHIA COLI PROTEUS MIRABILIS  (A)  Final   Report Status 03/17/2021 FINAL  Final   Organism  ID, Bacteria ESCHERICHIA COLI  Final   Organism ID, Bacteria PROTEUS MIRABILIS  Final      Susceptibility   Escherichia coli - MIC*    AMPICILLIN >=32 RESISTANT Resistant     CEFAZOLIN 8 SENSITIVE Sensitive     CEFEPIME <=0.12 SENSITIVE Sensitive     CEFTAZIDIME <=1 SENSITIVE Sensitive     CEFTRIAXONE <=0.25 SENSITIVE Sensitive     CIPROFLOXACIN <=0.25 SENSITIVE Sensitive     GENTAMICIN <=1 SENSITIVE Sensitive     IMIPENEM <=0.25 SENSITIVE Sensitive     TRIMETH/SULFA <=20 SENSITIVE Sensitive     AMPICILLIN/SULBACTAM >=32 RESISTANT Resistant     PIP/TAZO <=4 SENSITIVE Sensitive     * ESCHERICHIA COLI   Proteus mirabilis - MIC*    AMPICILLIN <=2 SENSITIVE Sensitive     CEFAZOLIN <=4 SENSITIVE Sensitive     CEFEPIME <=0.12 SENSITIVE Sensitive     CEFTAZIDIME <=1 SENSITIVE Sensitive     CEFTRIAXONE <=0.25 SENSITIVE Sensitive     CIPROFLOXACIN <=0.25 SENSITIVE Sensitive     GENTAMICIN <=1 SENSITIVE Sensitive     IMIPENEM 2 SENSITIVE Sensitive     TRIMETH/SULFA <=20 SENSITIVE Sensitive     AMPICILLIN/SULBACTAM <=2 SENSITIVE Sensitive     PIP/TAZO <=4 SENSITIVE Sensitive     * PROTEUS MIRABILIS  Culture, blood (routine x 2)     Status: Abnormal   Collection Time: 03/14/21  1:53 PM   Specimen: Right Antecubital; Blood  Result Value Ref Range Status   Specimen Description   Final    RIGHT ANTECUBITAL Performed at [facility], [place] [address] [place], [place] [address]    Special Requests   Final    BOTTLES DRAWN AEROBIC AND ANAEROBIC Blood Culture adequate volume Performed at [facility] [address] [place], [place] [address]    Culture  Setup Time   Final    GRAM NEGATIVE RODS IN BOTH AEROBIC AND ANAEROBIC BOTTLES CRITICAL RESULT CALLED TO, READ BACK BY AND VERIFIED WITH: V BRYK,PHARMD@0723  03/15/21 [place]    Culture (A)  Final    PROTEUS MIRABILIS ESCHERICHIA COLI SUSCEPTIBILITIES PERFORMED ON PREVIOUS CULTURE WITHIN THE LAST 5  DAYS. Performed at [facility], [place] [address] [place], [place] [address]    Report Status 03/17/2021 FINAL  Final  Blood Culture ID Panel (  Reflexed)     Status: Abnormal   Collection Time: 03/14/21  1:53 PM  Result Value Ref Range Status   Enterococcus faecalis NOT DETECTED NOT DETECTED Final   Enterococcus Faecium NOT DETECTED NOT DETECTED Final   Listeria monocytogenes NOT DETECTED NOT DETECTED Final   Staphylococcus species NOT DETECTED NOT DETECTED Final   Staphylococcus aureus (BCID) NOT DETECTED NOT DETECTED Final   Staphylococcus epidermidis NOT DETECTED NOT DETECTED Final   Staphylococcus lugdunensis NOT DETECTED NOT DETECTED Final   Streptococcus species NOT DETECTED NOT DETECTED Final   Streptococcus agalactiae NOT DETECTED NOT DETECTED Final   Streptococcus pneumoniae NOT DETECTED NOT DETECTED Final   Streptococcus pyogenes NOT DETECTED NOT DETECTED Final   A.calcoaceticus-baumannii NOT DETECTED NOT DETECTED Final   Bacteroides fragilis NOT DETECTED NOT DETECTED Final   Enterobacterales DETECTED (A) NOT DETECTED Final    Comment: CRITICAL RESULT CALLED TO, READ BACK BY AND VERIFIED WITH: V BRYK,PHARMD@0723  03/15/21 [place]    Enterobacter cloacae complex NOT DETECTED NOT DETECTED Final   Escherichia coli DETECTED (A) NOT DETECTED Final    Comment: CRITICAL RESULT CALLED TO, READ BACK BY AND VERIFIED WITH: V BRYK,PHARMD@0723  03/15/21 [place]    Klebsiella aerogenes NOT DETECTED NOT DETECTED Final   Klebsiella oxytoca NOT DETECTED NOT DETECTED Final   Klebsiella pneumoniae NOT DETECTED NOT DETECTED Final   Proteus species DETECTED (A) NOT DETECTED Final    Comment: CRITICAL RESULT CALLED TO, READ BACK BY AND VERIFIED WITH: V BRYK,PHARMD@0723  03/15/21 [place]    Salmonella species NOT DETECTED NOT DETECTED Final   Serratia marcescens NOT DETECTED NOT DETECTED Final   Haemophilus influenzae NOT DETECTED NOT DETECTED Final   Neisseria meningitidis NOT DETECTED NOT DETECTED  Final   Pseudomonas aeruginosa NOT DETECTED NOT DETECTED Final   Stenotrophomonas maltophilia NOT DETECTED NOT DETECTED Final   Candida albicans NOT DETECTED NOT DETECTED Final   Candida auris NOT DETECTED NOT DETECTED Final   Candida glabrata NOT DETECTED NOT DETECTED Final   Candida krusei NOT DETECTED NOT DETECTED Final   Candida parapsilosis NOT DETECTED NOT DETECTED Final   Candida tropicalis NOT DETECTED NOT DETECTED Final   Cryptococcus neoformans/gattii NOT DETECTED NOT DETECTED Final   CTX-M ESBL NOT DETECTED NOT DETECTED Final   Carbapenem resistance IMP NOT DETECTED NOT DETECTED Final   Carbapenem resistance KPC NOT DETECTED NOT DETECTED Final   Carbapenem resistance NDM NOT DETECTED NOT DETECTED Final   Carbapenem resist OXA 48 LIKE NOT DETECTED NOT DETECTED Final   Carbapenem resistance VIM NOT DETECTED NOT DETECTED Final    Comment: Performed at [facility], [address], [place], [place] [address]  Resp Panel by RT-PCR (Flu A&B, Covid) Nasopharyngeal Swab     Status: None   Collection Time: 03/14/21  1:54 PM   Specimen: Nasopharyngeal Swab; Nasopharyngeal(NP) swabs in vial transport medium  Result Value Ref Range Status   SARS Coronavirus 2 by RT PCR NEGATIVE NEGATIVE Final    Comment: (NOTE) SARS-CoV-2 target nucleic acids are NOT DETECTED.  The SARS-CoV-2 RNA is generally detectable in upper respiratory specimens during the acute phase of infection. The lowest concentration of SARS-CoV-2 viral copies this assay can detect is 138 copies/mL. A negative result does not preclude SARS-Cov-2 infection and should not be used as the sole basis for treatment or other patient management decisions. A negative result may occur with  improper specimen collection/handling, submission of specimen other than nasopharyngeal swab, presence of viral mutation(s) within the areas targeted by this  assay, and inadequate number of viral copies(<138 copies/mL). A negative result  must be combined with clinical observations, patient history, and epidemiological information. The expected result is Negative.  Fact Sheet for Patients:  EntrepreneurPulse.com.au  Fact Sheet for Healthcare Providers:  IncredibleEmployment.be  This test is no t yet approved or cleared by the [place] FDA and  has been authorized for detection and/or diagnosis of SARS-CoV-2 by FDA under an Emergency Use Authorization (EUA). This EUA will remain  in effect (meaning this test can be used) for the duration of the COVID-19 declaration under Section 564(b)(1) of the Act, 21 U.S.C.section 360bbb-3(b)(1), unless the authorization is terminated  or revoked sooner.       Influenza A by PCR NEGATIVE NEGATIVE Final   Influenza B by PCR NEGATIVE NEGATIVE Final    Comment: (NOTE) The Xpert Xpress SARS-CoV-2/FLU/RSV plus assay is intended as an aid in the diagnosis of influenza from Nasopharyngeal swab specimens and should not be used as a sole basis for treatment. Nasal washings and aspirates are unacceptable for Xpert Xpress SARS-CoV-2/FLU/RSV testing.  Fact Sheet for Patients: EntrepreneurPulse.com.au  Fact Sheet for Healthcare Providers: IncredibleEmployment.be  This test is not yet approved or cleared by the [place] FDA and has been authorized for detection and/or diagnosis of SARS-CoV-2 by FDA under an Emergency Use Authorization (EUA). This EUA will remain in effect (meaning this test can be used) for the duration of the COVID-19 declaration under Section 564(b)(1) of the Act, 21 U.S.C. section 360bbb-3(b)(1), unless the authorization is terminated or revoked.  Performed at [facility], [place] [address] [place], [place] [address]           Radiology Studies: No results found.      Scheduled Meds:  atorvastatin  10 mg Oral Daily   Chlorhexidine Gluconate Cloth  6  each Topical Daily   dexamethasone  4 mg Oral BID   feeding supplement  237 mL Oral BID BM   folic acid  1 mg Oral Daily   Gerhardt's butt cream   Topical QID   levETIRAcetam  500 mg Oral BID   levothyroxine  75 mcg Oral Q0600   magic mouthwash  10 mL Oral QID   mouth rinse  15 mL Mouth Rinse BID   metoprolol tartrate  25 mg Oral BID   multivitamin with minerals  1 tablet Oral Daily   potassium chloride  40 mEq Oral Daily   sodium chloride flush  10-40 mL Intracatheter Q12H   thiamine injection  100 mg Intravenous Daily   Or   thiamine  100 mg Oral Daily   Continuous Infusions:  cefTRIAXone (ROCEPHIN)  IV 2 g (03/23/21 0817)     LOS: 9 days    Time spent: 35 minutes.     Elmarie Shiley, MD [facility]   If 7PM-7AM, please contact night-coverage [email or web address]  03/23/2021, 2:59 PM

## 2021-03-24 DIAGNOSIS — D61818 Other pancytopenia: Secondary | ICD-10-CM | POA: Diagnosis not present

## 2021-03-24 NOTE — TOC Progression Note (Signed)
Transition of Care [facility]) - Progression Note    Patient Details  Name: Loretta Woods MRN: [id number] Date of Birth: 1956/05/06  Transition of Care [facility]) CM/SW [place], [place] Phone Number: 03/24/2021, 11:35 AM  Clinical Narrative:    CSW followed up with Di Kindle to see if insurance auth had been approved and if pt could be admitted over the weekend. CSW was advised admissions would not be available over the weekend. TOC will continue to follow.    Expected Discharge Plan: Skilled Nursing Facility Barriers to Discharge: Ship broker, Continued Medical Work up, SNF Pending bed offer  Expected Discharge Plan and Services Expected Discharge Plan: [place] Choice: [place] arrangements for the past 2 months: Single Family Home                                       Social Determinants of Health (SDOH) Interventions    Readmission Risk Interventions No flowsheet data found.

## 2021-03-24 NOTE — Progress Notes (Signed)
Pt hs been moved to 3W13. Family has been notified and received news well. Call light and telephone are within reach.

## 2021-03-24 NOTE — Progress Notes (Signed)
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  Patient apparently completed brain radiation couple of weeks ago and has been placed on chemotherapy 10 days ago.  Over the last couple of weeks she is becoming progressively weaker.  She was also occasionally confused and has been having a poor appetite in the last week. He was also found to have E. coli and Proteus bacteremia.  ID was consulted and is recommending total of 10 days of antibiotics until 9/10   Assessment & Plan:   Active Problems:   Acute metabolic encephalopathy   Bacteremia   1-Acute metabolic encephalopathy:  Likely multifactorial possible related to brain mets as well as bacteremia -MRI of the brain on admission showed multiple metastatic deposits stable since 02/06/2021, largest lesion in the right temporal lobe has a significant dural component likely due to a dural metastasis or dural invasion. Continue with Decadron and [facility] was consulted and signed off on 9/1 Improving.   E. coli and Proteus bacteremia: Blood cultures positive for 8 as well as Proteus species Continue with IV ceftriaxone. ID consulted and plan is to treat with antibiotics for a total of 10 days last dose 9/10 Completed antibiotics.   Stage IV adenocarcinoma of the lung: Status post whole brain radiation Started on chemotherapy by Dr. Lorenso Courier 8/22  Leukocytosis;  Could be related to dexamethasone, she did receive a dose of colony-stimulating factor.  ID informed of increased white count. No change in management.  Related to steroids. No other sign of infection.  Stable.   Hypothyroidism: TSH 7.0 Continue with current home dose.  Need repeat TSH in 3 to 4 weeks.  Seizure  Disorder: Continue with Keppra Hypokalemia: Continue to supplement. Hypertension: Discontinue HCTZ due to hyponatremia. Started  metoprolol.  Hyponatremia: Stable Hyperlipidemia: Continue with statins.   Pressure injury, POA: See documentation below Pressure Injury 03/15/21 Buttocks Right;Left Unstageable - Full thickness tissue loss in which the base of the injury is covered by slough (yellow, tan, gray, green or brown) and/or eschar (tan, brown or black) in the wound bed. moisture associated skin  (Active)  03/15/21   Location: Buttocks  Location Orientation: Right;Left  Staging: Unstageable - Full thickness tissue loss in which the base of the injury is covered by slough (yellow, tan, gray, green or brown) and/or eschar (tan, brown or black) in the wound bed.  Wound Description (Comments): moisture associated skin damage to bilat buttocks with patchy areas of Unstageable pressure injuries  Present on Admission: Yes     Nutrition Problem: Inadequate oral intake Etiology: cancer and cancer related treatments    Signs/Symptoms: per patient/family report, meal completion < 25%    Interventions: Ensure Enlive (each supplement provides 350kcal and 20 grams of protein), Liberalize Diet, MVI  Estimated body mass index is 16.06 kg/m as calculated from the following:   Height as of this encounter: 5\' 2"  (1.575 m).   Weight as of this encounter: 39.8 kg.   DVT prophylaxis: SCDs Code Status: DNR Family Communication: family 9/08 Disposition Plan:  Status is: Inpatient  Remains inpatient appropriate because:IV treatments appropriate due to intensity of illness or inability to take PO  Dispo: The patient is from: Home              Anticipated d/c is  to: SNF              Patient currently is  medically stable to d/c.Marland Kitchen awaiting insurance authorization.    Difficult to place patient No        Consultants:  ID   Procedures:  None  Antimicrobials:    Subjective: Poor  appetite. Denies pain   Objective: Vitals:   03/24/21 0000 03/24/21 0400 03/24/21 0802 03/24/21 1032  BP: 125/75 (!) 151/78 (!) 168/81 129/77  Pulse: 62 62 66 90  Resp: 18 18 16    Temp: 97.8 F (36.6 C) 97.9 F (36.6 C) 98.2 F (36.8 C)   TempSrc: Oral Oral Oral   SpO2: 97% 96% 100%   Weight:      Height:       No intake or output data in the 24 hours ending 03/24/21 1210  Filed Weights   03/17/21 0015  Weight: 39.8 kg    Examination:  General exam: NAD Respiratory system: CTA Cardiovascular system S 1, S 2 RRR Gastrointestinal system: BS present, soft, nt Central nervous system: Alert, follows command Extremities: No edema   Data Reviewed: I have personally reviewed following labs and imaging studies  CBC: Recent Labs  Lab 03/18/21 0210 03/19/21 0500 03/20/21 0558 03/21/21 0259 03/22/21 0656 03/23/21 1140  WBC 13.1* 17.2* 26.4* 29.0* 28.4* 28.0*  NEUTROABS 5.9  --   --   --   --   --   HGB 7.6* 8.0* 9.6* 8.3* 7.7* 8.3*  HCT 21.1* 22.3* 26.3* 23.3* 22.7* 24.6*  MCV 82.7 82.6 82.2 83.8 86.0 88.8  PLT 49* 80* 152 172 204 409    Basic Metabolic Panel: Recent Labs  Lab 03/18/21 0210 03/19/21 0500 03/20/21 0558 03/21/21 0259 03/22/21 0656 03/23/21 1140  NA 133* 129* 130* 132* 133* 134*  K 4.0 3.5 3.6 3.5 3.0* 3.6  CL 99 91* 95* 97* 99 99  CO2 28 26 25 26 28 27   GLUCOSE 124* 90 130* 117* 91 80  BUN 14 9 12 19 17 16   CREATININE 0.32* <0.30* 0.40* 0.47 0.40* 0.43*  CALCIUM 8.0* 8.1* 8.3* 8.1* 8.0* 8.0*  MG 1.8  --   --   --   --   --   PHOS 1.6*  --   --   --   --   --     GFR: Estimated Creatinine Clearance: 44.6 mL/min (A) (by C-G formula based on SCr of 0.43 mg/dL (L)). Liver Function Tests: Recent Labs  Lab 03/18/21 0210  AST 24  ALT 20  ALKPHOS 68  BILITOT 0.7  PROT 4.7*  ALBUMIN 2.0*    No results for input(s): LIPASE, AMYLASE in the last 168 hours. No results for input(s): AMMONIA in the last 168 hours.  Coagulation  Profile: No results for input(s): INR, PROTIME in the last 168 hours. Cardiac Enzymes: No results for input(s): CKTOTAL, CKMB, CKMBINDEX, TROPONINI in the last 168 hours. BNP (last 3 results) No results for input(s): PROBNP in the last 8760 hours. HbA1C: No results for input(s): HGBA1C in the last 72 hours. CBG: No results for input(s): GLUCAP in the last 168 hours. Lipid Profile: No results for input(s): CHOL, HDL, LDLCALC, TRIG, CHOLHDL, LDLDIRECT in the last 72 hours. Thyroid Function Tests: No results for input(s): TSH, T4TOTAL, FREET4, T3FREE, THYROIDAB in the last 72 hours. Anemia Panel: No results for input(s): VITAMINB12, FOLATE, FERRITIN, TIBC, IRON, RETICCTPCT in the last 72 hours. Sepsis Labs: No results for input(s): PROCALCITON, LATICACIDVEN in the  last 168 hours.   Recent Results (from the past 240 hour(s))  Culture, blood (routine x 2)     Status: Abnormal   Collection Time: 03/14/21  1:43 PM   Specimen: Left Antecubital; Blood  Result Value Ref Range Status   Specimen Description   Final    LEFT ANTECUBITAL Performed at [facility] [address] [place], [place] [address]    Special Requests   Final    BOTTLES DRAWN AEROBIC AND ANAEROBIC Blood Culture adequate volume Performed at [facility] [address], [place], [place] [address]    Culture  Setup Time   Final    GRAM NEGATIVE RODS IN BOTH AEROBIC AND ANAEROBIC BOTTLES CRITICAL VALUE NOTED.  VALUE IS CONSISTENT WITH PREVIOUSLY REPORTED AND CALLED VALUE. Performed at [facility], [place] [address], [place], [place] [address]    Culture ESCHERICHIA COLI PROTEUS MIRABILIS  (A)  Final   Report Status 03/17/2021 FINAL  Final   Organism ID, Bacteria ESCHERICHIA COLI  Final   Organism ID, Bacteria PROTEUS MIRABILIS  Final      Susceptibility   Escherichia coli - MIC*    AMPICILLIN >=32 RESISTANT Resistant     CEFAZOLIN 8 SENSITIVE Sensitive     CEFEPIME  <=0.12 SENSITIVE Sensitive     CEFTAZIDIME <=1 SENSITIVE Sensitive     CEFTRIAXONE <=0.25 SENSITIVE Sensitive     CIPROFLOXACIN <=0.25 SENSITIVE Sensitive     GENTAMICIN <=1 SENSITIVE Sensitive     IMIPENEM <=0.25 SENSITIVE Sensitive     TRIMETH/SULFA <=20 SENSITIVE Sensitive     AMPICILLIN/SULBACTAM >=32 RESISTANT Resistant     PIP/TAZO <=4 SENSITIVE Sensitive     * ESCHERICHIA COLI   Proteus mirabilis - MIC*    AMPICILLIN <=2 SENSITIVE Sensitive     CEFAZOLIN <=4 SENSITIVE Sensitive     CEFEPIME <=0.12 SENSITIVE Sensitive     CEFTAZIDIME <=1 SENSITIVE Sensitive     CEFTRIAXONE <=0.25 SENSITIVE Sensitive     CIPROFLOXACIN <=0.25 SENSITIVE Sensitive     GENTAMICIN <=1 SENSITIVE Sensitive     IMIPENEM 2 SENSITIVE Sensitive     TRIMETH/SULFA <=20 SENSITIVE Sensitive     AMPICILLIN/SULBACTAM <=2 SENSITIVE Sensitive     PIP/TAZO <=4 SENSITIVE Sensitive     * PROTEUS MIRABILIS  Culture, blood (routine x 2)     Status: Abnormal   Collection Time: 03/14/21  1:53 PM   Specimen: Right Antecubital; Blood  Result Value Ref Range Status   Specimen Description   Final    RIGHT ANTECUBITAL Performed at [facility], [place] [address] [place], [place] [address]    Special Requests   Final    BOTTLES DRAWN AEROBIC AND ANAEROBIC Blood Culture adequate volume Performed at [facility] [address] [place], [place] [address]    Culture  Setup Time   Final    GRAM NEGATIVE RODS IN BOTH AEROBIC AND ANAEROBIC BOTTLES CRITICAL RESULT CALLED TO, READ BACK BY AND VERIFIED WITH: V BRYK,PHARMD@0723  03/15/21 [place]    Culture (A)  Final    PROTEUS MIRABILIS ESCHERICHIA COLI SUSCEPTIBILITIES PERFORMED ON PREVIOUS CULTURE WITHIN THE LAST 5 DAYS. Performed at [facility], [place] [address], [place], [place] [address]    Report Status 03/17/2021 FINAL  Final  Blood Culture ID Panel (Reflexed)     Status: Abnormal   Collection Time: 03/14/21  1:53 PM  Result  Value Ref Range Status   Enterococcus faecalis NOT DETECTED  NOT DETECTED Final   Enterococcus Faecium NOT DETECTED NOT DETECTED Final   Listeria monocytogenes NOT DETECTED NOT DETECTED Final   Staphylococcus species NOT DETECTED NOT DETECTED Final   Staphylococcus aureus (BCID) NOT DETECTED NOT DETECTED Final   Staphylococcus epidermidis NOT DETECTED NOT DETECTED Final   Staphylococcus lugdunensis NOT DETECTED NOT DETECTED Final   Streptococcus species NOT DETECTED NOT DETECTED Final   Streptococcus agalactiae NOT DETECTED NOT DETECTED Final   Streptococcus pneumoniae NOT DETECTED NOT DETECTED Final   Streptococcus pyogenes NOT DETECTED NOT DETECTED Final   A.calcoaceticus-baumannii NOT DETECTED NOT DETECTED Final   Bacteroides fragilis NOT DETECTED NOT DETECTED Final   Enterobacterales DETECTED (A) NOT DETECTED Final    Comment: CRITICAL RESULT CALLED TO, READ BACK BY AND VERIFIED WITH: V BRYK,PHARMD@0723  03/15/21 [place]    Enterobacter cloacae complex NOT DETECTED NOT DETECTED Final   Escherichia coli DETECTED (A) NOT DETECTED Final    Comment: CRITICAL RESULT CALLED TO, READ BACK BY AND VERIFIED WITH: V BRYK,PHARMD@0723  03/15/21 [place]    Klebsiella aerogenes NOT DETECTED NOT DETECTED Final   Klebsiella oxytoca NOT DETECTED NOT DETECTED Final   Klebsiella pneumoniae NOT DETECTED NOT DETECTED Final   Proteus species DETECTED (A) NOT DETECTED Final    Comment: CRITICAL RESULT CALLED TO, READ BACK BY AND VERIFIED WITH: V BRYK,PHARMD@0723  03/15/21 [place]    Salmonella species NOT DETECTED NOT DETECTED Final   Serratia marcescens NOT DETECTED NOT DETECTED Final   Haemophilus influenzae NOT DETECTED NOT DETECTED Final   Neisseria meningitidis NOT DETECTED NOT DETECTED Final   Pseudomonas aeruginosa NOT DETECTED NOT DETECTED Final   Stenotrophomonas maltophilia NOT DETECTED NOT DETECTED Final   Candida albicans NOT DETECTED NOT DETECTED Final   Candida auris NOT DETECTED NOT DETECTED Final    Candida glabrata NOT DETECTED NOT DETECTED Final   Candida krusei NOT DETECTED NOT DETECTED Final   Candida parapsilosis NOT DETECTED NOT DETECTED Final   Candida tropicalis NOT DETECTED NOT DETECTED Final   Cryptococcus neoformans/gattii NOT DETECTED NOT DETECTED Final   CTX-M ESBL NOT DETECTED NOT DETECTED Final   Carbapenem resistance IMP NOT DETECTED NOT DETECTED Final   Carbapenem resistance KPC NOT DETECTED NOT DETECTED Final   Carbapenem resistance NDM NOT DETECTED NOT DETECTED Final   Carbapenem resist OXA 48 LIKE NOT DETECTED NOT DETECTED Final   Carbapenem resistance VIM NOT DETECTED NOT DETECTED Final    Comment: Performed at [facility], [address] [place], [place] [address]  Resp Panel by RT-PCR (Flu A&B, Covid) Nasopharyngeal Swab     Status: None   Collection Time: 03/14/21  1:54 PM   Specimen: Nasopharyngeal Swab; Nasopharyngeal(NP) swabs in vial transport medium  Result Value Ref Range Status   SARS Coronavirus 2 by RT PCR NEGATIVE NEGATIVE Final    Comment: (NOTE) SARS-CoV-2 target nucleic acids are NOT DETECTED.  The SARS-CoV-2 RNA is generally detectable in upper respiratory specimens during the acute phase of infection. The lowest concentration of SARS-CoV-2 viral copies this assay can detect is 138 copies/mL. A negative result does not preclude SARS-Cov-2 infection and should not be used as the sole basis for treatment or other patient management decisions. A negative result may occur with  improper specimen collection/handling, submission of specimen other than nasopharyngeal swab, presence of viral mutation(s) within the areas targeted by this assay, and inadequate number of viral copies(<138 copies/mL). A negative result must be combined with clinical observations, patient history, and epidemiological information. The expected result is Negative.  Fact Sheet for Patients:  EntrepreneurPulse.com.au  Fact Sheet for Healthcare  Providers:  IncredibleEmployment.be  This test is no t yet approved or cleared by the [place] FDA and  has been authorized for detection and/or diagnosis of SARS-CoV-2 by FDA under an Emergency Use Authorization (EUA). This EUA will remain  in effect (meaning this test can be used) for the duration of the COVID-19 declaration under Section 564(b)(1) of the Act, 21 U.S.C.section 360bbb-3(b)(1), unless the authorization is terminated  or revoked sooner.       Influenza A by PCR NEGATIVE NEGATIVE Final   Influenza B by PCR NEGATIVE NEGATIVE Final    Comment: (NOTE) The Xpert Xpress SARS-CoV-2/FLU/RSV plus assay is intended as an aid in the diagnosis of influenza from Nasopharyngeal swab specimens and should not be used as a sole basis for treatment. Nasal washings and aspirates are unacceptable for Xpert Xpress SARS-CoV-2/FLU/RSV testing.  Fact Sheet for Patients: EntrepreneurPulse.com.au  Fact Sheet for Healthcare Providers: IncredibleEmployment.be  This test is not yet approved or cleared by the [place] FDA and has been authorized for detection and/or diagnosis of SARS-CoV-2 by FDA under an Emergency Use Authorization (EUA). This EUA will remain in effect (meaning this test can be used) for the duration of the COVID-19 declaration under Section 564(b)(1) of the Act, 21 U.S.C. section 360bbb-3(b)(1), unless the authorization is terminated or revoked.  Performed at [facility], [employer] [address], [place], [place] [address]           Radiology Studies: No results found.      Scheduled Meds:  atorvastatin  10 mg Oral Daily   Chlorhexidine Gluconate Cloth  6 each Topical Daily   dexamethasone  4 mg Oral BID   feeding supplement  237 mL Oral BID BM   folic acid  1 mg Oral Daily   Gerhardt's butt cream   Topical QID   levETIRAcetam  500 mg Oral BID   levothyroxine  75 mcg Oral Q0600    magic mouthwash  10 mL Oral QID   mouth rinse  15 mL Mouth Rinse BID   metoprolol tartrate  25 mg Oral BID   multivitamin with minerals  1 tablet Oral Daily   potassium chloride  40 mEq Oral Daily   sodium chloride flush  10-40 mL Intracatheter Q12H   thiamine injection  100 mg Intravenous Daily   Or   thiamine  100 mg Oral Daily   Continuous Infusions:     LOS: 10 days    Time spent: 35 minutes.     Elmarie Shiley, MD [facility]   If 7PM-7AM, please contact night-coverage [email or web address]  03/24/2021, 12:10 PM

## 2021-03-25 LAB — BASIC METABOLIC PANEL
Anion gap: 7 (ref 5–15)
BUN: 16 mg/dL (ref 8–23)
CO2: 27 mmol/L (ref 22–32)
Calcium: 8.1 mg/dL — ABNORMAL LOW (ref 8.9–10.3)
Chloride: 99 mmol/L (ref 98–111)
Creatinine, Ser: 0.36 mg/dL — ABNORMAL LOW (ref 0.44–1.00)
GFR, Estimated: >60 mL/min (ref >60)
Glucose, Bld: 96 mg/dL (ref 70–99)
Potassium: 3.8 mmol/L (ref 3.5–5.1)
Sodium: 133 mmol/L — ABNORMAL LOW (ref 135–145)

## 2021-03-25 LAB — CBC
HCT: 23.3 % — ABNORMAL LOW (ref 36.0–46.0)
Hemoglobin: 8 g/dL — ABNORMAL LOW (ref 12.0–15.0)
MCH: 30.7 pg (ref 26.0–34.0)
MCHC: 34.3 g/dL (ref 30.0–36.0)
MCV: 89.3 fL (ref 80.0–100.0)
Platelets: 243 10*3/uL (ref 150–400)
RBC: 2.61 MIL/uL — ABNORMAL LOW (ref 3.87–5.11)
RDW: 16.2 % — ABNORMAL HIGH (ref 11.5–15.5)
WBC: 26.5 10*3/uL — ABNORMAL HIGH (ref 4.0–10.5)
nRBC: 3.2 % — ABNORMAL HIGH (ref 0.0–0.2)

## 2021-03-25 NOTE — Progress Notes (Signed)
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  Patient apparently completed brain radiation couple of weeks ago and has been placed on chemotherapy 10 days ago.  Over the last couple of weeks she is becoming progressively weaker.  She was also occasionally confused and has been having a poor appetite in the last week. He was also found to have E. coli and Proteus bacteremia.  ID was consulted and is recommending total of 10 days of antibiotics until 9/10   Assessment & Plan:   Active Problems:   Acute metabolic encephalopathy   Bacteremia   1-Acute metabolic encephalopathy:  Likely multifactorial possible related to brain mets as well as bacteremia -MRI of the brain on admission showed multiple metastatic deposits stable since 02/06/2021, largest lesion in the right temporal lobe has a significant dural component likely due to a dural metastasis or dural invasion. Continue with Decadron and [facility] was consulted and signed off on 9/1 Stable.   E. coli and Proteus bacteremia: Blood cultures positive for 8 as well as Proteus species Continue with IV ceftriaxone. ID consulted and plan is to treat with antibiotics for a total of 10 days last dose 9/10 Completed antibiotics.  WBC trending down.   Stage IV adenocarcinoma of the lung: Status post whole brain radiation Started on chemotherapy by Dr. Lorenso Courier 8/22.  Leukocytosis;  Could be related to dexamethasone, she did receive a dose of colony-stimulating factor.  ID informed of increased white count. No change in management.  Related to steroids. No other sign of infection.  Stable.   Hypothyroidism: TSH 7.0 Continue with current home dose.  Need repeat TSH in 3 to 4  weeks.  Seizure Disorder: Continue with Keppra Hypokalemia: Continue to supplement. Hypertension: Discontinue HCTZ due to hyponatremia. Started  metoprolol.  Hyponatremia: Stable Hyperlipidemia: Continue with statins.   Pressure injury, POA: See documentation below Pressure Injury 03/15/21 Buttocks Right;Left Unstageable - Full thickness tissue loss in which the base of the injury is covered by slough (yellow, tan, gray, green or brown) and/or eschar (tan, brown or black) in the wound bed. moisture associated skin  (Active)  03/15/21   Location: Buttocks  Location Orientation: Right;Left  Staging: Unstageable - Full thickness tissue loss in which the base of the injury is covered by slough (yellow, tan, gray, green or brown) and/or eschar (tan, brown or black) in the wound bed.  Wound Description (Comments): moisture associated skin damage to bilat buttocks with patchy areas of Unstageable pressure injuries  Present on Admission: Yes     Nutrition Problem: Inadequate oral intake Etiology: cancer and cancer related treatments    Signs/Symptoms: per patient/family report, meal completion < 25%    Interventions: Ensure Enlive (each supplement provides 350kcal and 20 grams of protein), Liberalize Diet, MVI  Estimated body mass index is 16.06 kg/m as calculated from the following:   Height as of this encounter: 5\' 2"  (1.575 m).   Weight as of this encounter: 39.8 kg.   DVT prophylaxis: SCDs Code Status: DNR Family Communication: family 9/08 Disposition Plan:  Status is: Inpatient  Remains inpatient appropriate because:IV treatments appropriate due to intensity of illness or inability to take PO  Dispo: The patient is from: Home  Anticipated d/c is to: SNF              Patient currently is  medically stable to d/c.Marland Kitchen awaiting insurance authorization.    Difficult to place patient No        Consultants:  ID   Procedures:  None  Antimicrobials:     Subjective: She feels better today, eating some bananas. Awaiting to go to rehab.   Objective: Vitals:   03/25/21 0755 03/25/21 1030 03/25/21 1126 03/25/21 1517  BP: (!) 175/79 (!) 158/86 137/75 (!) 147/76  Pulse: 60 85 90 (!) 55  Resp: 20  (!) 22 14  Temp: 98 F (36.7 C)  (!) 97.5 F (36.4 C) 97.7 F (36.5 C)  TempSrc: Oral  Oral Oral  SpO2: 100%   95%  Weight:      Height:        Intake/Output Summary (Last 24 hours) at 03/25/2021 1653 Last data filed at 03/25/2021 8299 Gross per 24 hour  Intake --  Output 150 ml  Net -150 ml    Filed Weights   03/17/21 0015  Weight: 39.8 kg    Examination:  General exam; NAD Respiratory system: CTA Cardiovascular system S 1, S 2 RRR Gastrointestinal system: BS present, soft, nt Central nervous system: Alert, follows command.  Extremities: No edema   Data Reviewed: I have personally reviewed following labs and imaging studies  CBC: Recent Labs  Lab 03/20/21 0558 03/21/21 0259 03/22/21 0656 03/23/21 1140 03/25/21 0500  WBC 26.4* 29.0* 28.4* 28.0* 26.5*  HGB 9.6* 8.3* 7.7* 8.3* 8.0*  HCT 26.3* 23.3* 22.7* 24.6* 23.3*  MCV 82.2 83.8 86.0 88.8 89.3  PLT 152 172 204 218 371    Basic Metabolic Panel: Recent Labs  Lab 03/20/21 0558 03/21/21 0259 03/22/21 0656 03/23/21 1140 03/25/21 0500  NA 130* 132* 133* 134* 133*  K 3.6 3.5 3.0* 3.6 3.8  CL 95* 97* 99 99 99  CO2 25 26 28 27 27   GLUCOSE 130* 117* 91 80 96  BUN 12 19 17 16 16   CREATININE 0.40* 0.47 0.40* 0.43* 0.36*  CALCIUM 8.3* 8.1* 8.0* 8.0* 8.1*    GFR: Estimated Creatinine Clearance: 44.6 mL/min (A) (by C-G formula based on SCr of 0.36 mg/dL (L)). Liver Function Tests: No results for input(s): AST, ALT, ALKPHOS, BILITOT, PROT, ALBUMIN in the last 168 hours.  No results for input(s): LIPASE, AMYLASE in the last 168 hours. No results for input(s): AMMONIA in the last 168 hours.  Coagulation Profile: No results for input(s): INR, PROTIME in the  last 168 hours. Cardiac Enzymes: No results for input(s): CKTOTAL, CKMB, CKMBINDEX, TROPONINI in the last 168 hours. BNP (last 3 results) No results for input(s): PROBNP in the last 8760 hours. HbA1C: No results for input(s): HGBA1C in the last 72 hours. CBG: No results for input(s): GLUCAP in the last 168 hours. Lipid Profile: No results for input(s): CHOL, HDL, LDLCALC, TRIG, CHOLHDL, LDLDIRECT in the last 72 hours. Thyroid Function Tests: No results for input(s): TSH, T4TOTAL, FREET4, T3FREE, THYROIDAB in the last 72 hours. Anemia Panel: No results for input(s): VITAMINB12, FOLATE, FERRITIN, TIBC, IRON, RETICCTPCT in the last 72 hours. Sepsis Labs: No results for input(s): PROCALCITON, LATICACIDVEN in the last 168 hours.   No results found for this or any previous visit (from the past 240 hour(s)).         Radiology Studies: No results found.      Scheduled Meds:  atorvastatin  10 mg Oral Daily  Chlorhexidine Gluconate Cloth  6 each Topical Daily   dexamethasone  4 mg Oral BID   feeding supplement  237 mL Oral BID BM   folic acid  1 mg Oral Daily   Gerhardt's butt cream   Topical QID   levETIRAcetam  500 mg Oral BID   levothyroxine  75 mcg Oral Q0600   magic mouthwash  10 mL Oral QID   mouth rinse  15 mL Mouth Rinse BID   metoprolol tartrate  25 mg Oral BID   multivitamin with minerals  1 tablet Oral Daily   potassium chloride  40 mEq Oral Daily   sodium chloride flush  10-40 mL Intracatheter Q12H   thiamine injection  100 mg Intravenous Daily   Or   thiamine  100 mg Oral Daily   Continuous Infusions:     LOS: 11 days    Time spent: 35 minutes.     Elmarie Shiley, MD [facility]   If 7PM-7AM, please contact night-coverage [email or web address]  03/25/2021, 4:53 PM

## 2021-03-26 ENCOUNTER — Inpatient Hospital Stay: Payer: 59 | Admitting: Nutrition

## 2021-03-26 ENCOUNTER — Inpatient Hospital Stay: Payer: 59 | Admitting: Hematology and Oncology

## 2021-03-26 ENCOUNTER — Inpatient Hospital Stay: Payer: 59

## 2021-03-26 DIAGNOSIS — R7881 Bacteremia: Secondary | ICD-10-CM | POA: Diagnosis not present

## 2021-03-26 NOTE — Progress Notes (Signed)
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  Patient apparently completed brain radiation couple of weeks ago and has been placed on chemotherapy 10 days ago.  Over the last couple of weeks she is becoming progressively weaker.  She was also occasionally confused and has been having a poor appetite in the last week. He was also found to have E. coli and Proteus bacteremia.  ID was consulted and is recommending total of 10 days of antibiotics until 9/10   Assessment & Plan:   Active Problems:   Acute metabolic encephalopathy   Bacteremia   1-Acute metabolic encephalopathy:  Likely multifactorial possible related to brain mets as well as bacteremia -MRI of the brain on admission showed multiple metastatic deposits stable since 02/06/2021, largest lesion in the right temporal lobe has a significant dural component likely due to a dural metastasis or dural invasion. Continue with Decadron and [place] [facility] was consulted and signed off on 9/1 Improving  E. coli and Proteus bacteremia: Blood cultures positive for 8 as well as Proteus species Continue with IV ceftriaxone. ID consulted and plan is to treat with antibiotics for a total of 10 days last dose 9/10 Completed antibiotics.  WBC trending down.   Stage IV adenocarcinoma of the lung: Status post whole brain radiation Started on chemotherapy by Dr. Lorenso Courier 8/22.  Leukocytosis;  Could be related to dexamethasone, she did receive a dose of colony-stimulating factor.  ID informed of increased white count. No change in management.  Related to steroids. No other sign of infection.  Stable.   Hypothyroidism: TSH 7.0 Continue with current home dose.  Need repeat TSH in 3 to 4  weeks.  Seizure Disorder: Continue with Keppra Hypokalemia: Continue to supplement. Hypertension: Discontinue HCTZ due to hyponatremia. Started  metoprolol.  Hyponatremia: Stable Hyperlipidemia: Continue with statins.   Pressure injury, POA: See documentation below Pressure Injury 03/15/21 Buttocks Right;Left Unstageable - Full thickness tissue loss in which the base of the injury is covered by slough (yellow, tan, gray, green or brown) and/or eschar (tan, brown or black) in the wound bed. moisture associated skin  (Active)  03/15/21   Location: Buttocks  Location Orientation: Right;Left  Staging: Unstageable - Full thickness tissue loss in which the base of the injury is covered by slough (yellow, tan, gray, green or brown) and/or eschar (tan, brown or black) in the wound bed.  Wound Description (Comments): moisture associated skin damage to bilat buttocks with patchy areas of Unstageable pressure injuries  Present on Admission: Yes     Nutrition Problem: Inadequate oral intake Etiology: cancer and cancer related treatments    Signs/Symptoms: per patient/family report, meal completion < 25%    Interventions: Ensure Enlive (each supplement provides 350kcal and 20 grams of protein), Liberalize Diet, MVI  Estimated body mass index is 16.06 kg/m as calculated from the following:   Height as of this encounter: 5\' 2"  (1.575 m).   Weight as of this encounter: 39.8 kg.   DVT prophylaxis: SCDs Code Status: DNR Family Communication: family 9/08 Disposition Plan:  Status is: Inpatient  Remains inpatient appropriate because:IV treatments appropriate due to intensity of illness or inability to take PO  Dispo: The patient is from: Home  Anticipated d/c is to: SNF              Patient currently is  medically stable to d/c.Marland Kitchen awaiting insurance authorization.    Difficult to place patient No        Consultants:  ID   Procedures:  None  Antimicrobials:     Subjective: No new complaints, feels well  Objective: Vitals:   03/26/21 0011 03/26/21 0338 03/26/21 0850 03/26/21 1344  BP: (!) 165/79 (!) 164/77 (!) 164/81 (!) 116/58  Pulse: 60 61 63 71  Resp: 16 16 16 16   Temp: 97.8 F (36.6 C) 98.3 F (36.8 C) (!) 97.4 F (36.3 C) 97.7 F (36.5 C)  TempSrc: Oral Oral Oral   SpO2: 94% 97% 97% 98%  Weight:      Height:        Intake/Output Summary (Last 24 hours) at 03/26/2021 1624 Last data filed at 03/25/2021 1718 Gross per 24 hour  Intake --  Output 200 ml  Net -200 ml    Filed Weights   03/17/21 0015  Weight: 39.8 kg    Examination:  General exam; NAD Respiratory system: CTA Cardiovascular system S 1, S 2 RRR Gastrointestinal system: BS present, soft, nt Central nervous system: Alert, follows command Extremities: No edema   Data Reviewed: I have personally reviewed following labs and imaging studies  CBC: Recent Labs  Lab 03/20/21 0558 03/21/21 0259 03/22/21 0656 03/23/21 1140 03/25/21 0500  WBC 26.4* 29.0* 28.4* 28.0* 26.5*  HGB 9.6* 8.3* 7.7* 8.3* 8.0*  HCT 26.3* 23.3* 22.7* 24.6* 23.3*  MCV 82.2 83.8 86.0 88.8 89.3  PLT 152 172 204 218 622    Basic Metabolic Panel: Recent Labs  Lab 03/20/21 0558 03/21/21 0259 03/22/21 0656 03/23/21 1140 03/25/21 0500  NA 130* 132* 133* 134* 133*  K 3.6 3.5 3.0* 3.6 3.8  CL 95* 97* 99 99 99  CO2 25 26 28 27 27   GLUCOSE 130* 117* 91 80 96  BUN 12 19 17 16 16   CREATININE 0.40* 0.47 0.40* 0.43* 0.36*  CALCIUM 8.3* 8.1* 8.0* 8.0* 8.1*    GFR: Estimated Creatinine Clearance: 44.6 mL/min (A) (by C-G formula based on SCr of 0.36 mg/dL (L)). Liver Function Tests: No results for input(s): AST, ALT, ALKPHOS, BILITOT, PROT, ALBUMIN in the last 168 hours.  No results for input(s): LIPASE, AMYLASE in the last 168 hours. No results for input(s): AMMONIA in the last 168 hours.  Coagulation Profile: No results for input(s): INR, PROTIME in the last 168  hours. Cardiac Enzymes: No results for input(s): CKTOTAL, CKMB, CKMBINDEX, TROPONINI in the last 168 hours. BNP (last 3 results) No results for input(s): PROBNP in the last 8760 hours. HbA1C: No results for input(s): HGBA1C in the last 72 hours. CBG: No results for input(s): GLUCAP in the last 168 hours. Lipid Profile: No results for input(s): CHOL, HDL, LDLCALC, TRIG, CHOLHDL, LDLDIRECT in the last 72 hours. Thyroid Function Tests: No results for input(s): TSH, T4TOTAL, FREET4, T3FREE, THYROIDAB in the last 72 hours. Anemia Panel: No results for input(s): VITAMINB12, FOLATE, FERRITIN, TIBC, IRON, RETICCTPCT in the last 72 hours. Sepsis Labs: No results for input(s): PROCALCITON, LATICACIDVEN in the last 168 hours.   No results found for this or any previous visit (from the past 240 hour(s)).         Radiology Studies: No results found.      Scheduled Meds:  atorvastatin  10 mg Oral Daily   Chlorhexidine Gluconate Cloth  6  each Topical Daily   dexamethasone  4 mg Oral BID   feeding supplement  237 mL Oral BID BM   folic acid  1 mg Oral Daily   Gerhardt's butt cream   Topical QID   levETIRAcetam  500 mg Oral BID   levothyroxine  75 mcg Oral Q0600   magic mouthwash  10 mL Oral QID   mouth rinse  15 mL Mouth Rinse BID   metoprolol tartrate  25 mg Oral BID   multivitamin with minerals  1 tablet Oral Daily   potassium chloride  40 mEq Oral Daily   sodium chloride flush  10-40 mL Intracatheter Q12H   thiamine injection  100 mg Intravenous Daily   Or   thiamine  100 mg Oral Daily   Continuous Infusions:     LOS: 12 days    Time spent: 35 minutes.     Elmarie Shiley, MD [facility]   If 7PM-7AM, please contact night-coverage [email or web address]  03/26/2021, 4:24 PM

## 2021-03-26 NOTE — Plan of Care (Signed)
  Problem: Education: Goal: Knowledge of disease or condition will improve Outcome: Progressing Goal: Knowledge of secondary prevention will improve Outcome: Progressing Goal: Knowledge of patient specific risk factors addressed and post discharge goals established will improve Outcome: Progressing   Problem: Clinical Measurements: Goal: Ability to maintain clinical measurements within normal limits will improve Outcome: Progressing Goal: Will remain free from infection Outcome: Progressing Goal: Diagnostic test results will improve Outcome: Progressing Goal: Respiratory complications will improve Outcome: Progressing Goal: Cardiovascular complication will be avoided Outcome: Progressing   Problem: Safety: Goal: Ability to remain free from injury will improve Outcome: Progressing

## 2021-03-27 ENCOUNTER — Other Ambulatory Visit: Payer: Self-pay | Admitting: Radiation Therapy

## 2021-03-27 ENCOUNTER — Ambulatory Visit: Payer: 59 | Admitting: Radiation Oncology

## 2021-03-27 DIAGNOSIS — C7949 Secondary malignant neoplasm of other parts of nervous system: Secondary | ICD-10-CM

## 2021-03-27 DIAGNOSIS — R7881 Bacteremia: Secondary | ICD-10-CM | POA: Diagnosis not present

## 2021-03-27 DIAGNOSIS — C7931 Secondary malignant neoplasm of brain: Secondary | ICD-10-CM

## 2021-03-27 LAB — PATHOLOGIST SMEAR REVIEW

## 2021-03-27 MED ORDER — WHITE PETROLATUM EX OINT
TOPICAL_OINTMENT | CUTANEOUS | Status: AC
  Administered 2021-03-27: 0.2
  Filled 2021-03-27: qty 28.35

## 2021-03-27 MED ORDER — DEXAMETHASONE 4 MG PO TABS: 4.0000 mg | ORAL_TABLET | Freq: Two times a day (BID) | ORAL | 0 refills | Status: AC

## 2021-03-27 MED ORDER — THIAMINE HCL 100 MG PO TABS: 100.0000 mg | ORAL_TABLET | Freq: Every day | ORAL | 0 refills | Status: AC

## 2021-03-27 MED ORDER — METOPROLOL TARTRATE 25 MG PO TABS: 25.0000 mg | ORAL_TABLET | Freq: Two times a day (BID) | ORAL | 0 refills | Status: AC

## 2021-03-27 MED ORDER — AMLODIPINE BESYLATE 5 MG PO TABS
5.0000 mg | ORAL_TABLET | Freq: Every day | ORAL | Status: DC
  Administered 2021-03-27 – 2021-03-28 (×2): 5 mg via ORAL
  Filled 2021-03-27 (×2): qty 1

## 2021-03-27 MED ORDER — MAGIC MOUTHWASH: 10.0000 mL | Freq: Four times a day (QID) | ORAL | 0 refills | Status: AC

## 2021-03-27 MED ORDER — AMLODIPINE BESYLATE 5 MG PO TABS: 5.0000 mg | ORAL_TABLET | Freq: Every day | ORAL | 0 refills | Status: AC

## 2021-03-27 NOTE — Discharge Summary (Addendum)
Physician Discharge Summary  Loretta Woods ATF:[id number] DOB: May 02, 1956 DOA: 03/14/2021  PCP: Leonard Downing, MD  Admit date: 03/14/2021 Discharge date: 03/28/2021  Admitted From: Home  Disposition:  SNF  Recommendations for Outpatient Follow-up:  Follow up with PCP in 1-2 weeks Please obtain BMP/CBC in one week Needs to follow up with Oncologist.  Referral to palliative care, Oncologist will facilitate    Discharge Condition: Stable.  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  Patient apparently completed brain radiation couple of weeks ago and has been placed on chemotherapy 10 days ago.  Over the last couple of weeks she is becoming progressively weaker.  She was also occasionally confused and has been having a poor appetite in the last week. He was also found to have E. coli and Proteus bacteremia.  ID was consulted and is recommending total of 10 days of antibiotics until 9/10.    1-Acute metabolic encephalopathy:  Likely multifactorial possible related to brain mets as well as bacteremia -MRI of the brain on admission showed multiple metastatic deposits stable since 02/06/2021, largest lesion in the right temporal lobe has a significant dural component likely due to a dural metastasis or dural invasion. Continue with Decadron and Staves Neurology was consulted and signed off on 9/1 Improving, awaiting SNF   E. coli and Proteus bacteremia: Blood cultures positive for 8 as well as Proteus species Continue with IV ceftriaxone. ID consulted and plan is to treat with antibiotics for a total of 10 days last dose 9/10 Completed antibiotics.  WBC trending down.    Stage IV adenocarcinoma of the lung: Status post whole brain radiation Started on chemotherapy by Dr.  Lorenso Courier 8/22.   Leukocytosis;  Could be related to dexamethasone, she did receive a dose of colony-stimulating factor.  ID informed of increased white count. No change in management.  Related to steroids. No other sign of infection.  Stable.    Hypothyroidism: TSH 7.0 Continue with current home dose.  Need repeat TSH in 3 to 4 weeks.   Seizure Disorder: Continue with Keppra Hypokalemia: resolved. Monitor  Hypertension: Discontinue HCTZ due to hyponatremia. Started  metoprolol and Norvasc. Marland Kitchen  Hyponatremia: Stable Hyperlipidemia: Continue with statins.  Nutrition; clinically undeterminate   Pressure injury, POA: See documentation below Pressure Injury 03/15/21 Buttocks Right;Left Unstageable - Full thickness tissue loss in which the base of the injury is covered by slough (yellow, tan, gray, green or brown) and/or eschar (tan, brown or black) in the wound bed. moisture associated skin  (Active)  03/15/21   Location: Buttocks  Location Orientation: Right;Left  Staging: Unstageable - Full thickness tissue loss in which the base of the injury is covered by slough (yellow, tan, gray, green or brown) and/or eschar (tan, brown or black) in the wound bed.  Wound Description (Comments): moisture associated skin damage to bilat buttocks with patchy areas of Unstageable pressure injuries  Present on Admission: Yes         Discharge Diagnoses:  Active Problems:   Acute metabolic encephalopathy   Bacteremia    Discharge Instructions   Allergies as of 03/27/2021   No Known Allergies      Medication List     STOP taking these medications    fluconazole 100 MG tablet Commonly known as: DIFLUCAN   hydrochlorothiazide 50 MG tablet Commonly known as: HYDRODIURIL  potassium chloride SA 20 MEQ tablet Commonly known as: KLOR-CON   temazepam 22.5 MG capsule Commonly known as: RESTORIL       TAKE these medications    acetaminophen 500 MG tablet Commonly known as: TYLENOL Take  500 mg by mouth every 6 (six) hours as needed for moderate pain.   amLODipine 5 MG tablet Commonly known as: NORVASC Take 1 tablet (5 mg total) by mouth daily. Start taking on: March 28, 2021   atorvastatin 10 MG tablet Commonly known as: LIPITOR Take 10 mg by mouth daily.   citalopram 20 MG tablet Commonly known as: CELEXA Take 1 tablet (20 mg total) by mouth daily.   dexamethasone 4 MG tablet Commonly known as: DECADRON Take 1 tablet (4 mg total) by mouth 2 (two) times daily. What changed:  medication strength how much to take how to take this when to take this additional instructions   Emergen-C Immune Pack Take 1 packet by mouth daily as needed (immune support).   Euthyrox 75 MCG tablet Generic drug: levothyroxine Take 75 mcg by mouth daily.   folic acid 1 MG tablet Commonly known as: FOLVITE Take 1 tablet (1 mg total) by mouth daily.   levETIRAcetam 500 MG tablet Commonly known as: KEPPRA Take 1 tablet (500 mg total) by mouth 2 (two) times daily. What changed:  when to take this reasons to take this   lidocaine-prilocaine cream Commonly known as: EMLA Apply 1 application topically as needed. What changed: reasons to take this   magic mouthwash Soln Take 10 mLs by mouth 4 (four) times daily.   metoprolol tartrate 25 MG tablet Commonly known as: LOPRESSOR Take 1 tablet (25 mg total) by mouth 2 (two) times daily.   ondansetron 8 MG tablet Commonly known as: ZOFRAN Take 1 tablet (8 mg total) by mouth every 8 (eight) hours as needed. What changed: reasons to take this   prochlorperazine 10 MG tablet Commonly known as: COMPAZINE Take 1 tablet (10 mg total) by mouth every 6 (six) hours as needed for nausea or vomiting.   thiamine 100 MG tablet Take 1 tablet (100 mg total) by mouth daily. Start taking on: March 28, 2021        No Known Allergies  Consultations: ID Oncology    Procedures/Studies: CT Head Wo Contrast  Result Date:  03/14/2021 CLINICAL DATA:  Mental status change, unknown cause. Additional history provided: Confusion and weakness. EXAM: CT HEAD WITHOUT CONTRAST TECHNIQUE: Contiguous axial images were obtained from the base of the skull through the vertex without intravenous contrast. COMPARISON:  Brain MRI 02/06/2021.  Head CT 01/17/2021. FINDINGS: Brain: The patient has multiple known intracranial parenchymal metastases, better appreciated on the prior brain MRI of 02/06/2021. As before, the largest metastasis is present within the right temporal lobe. No definitively new metastasis is identified. There is local mass effect associated with the dominant right temporal lobe metastasis with partial effacement of the right lateral ventricle. However, there is no midline shift. Redemonstrated scattered foci of chronic encephalomalacia/gliosis within the bilateral cerebral hemispheres, the largest again within the mid left frontal lobe (for instance as seen on series 2, image 21). There are foci of parenchymal calcification within the left frontal and right temporal lobes. Background chronic small-vessel ischemic changes within the cerebral white matter and pons. Small chronic infarcts within the left cerebellum, many of which were better appreciated on the brain MRI of 02/06/2021 (acute at that time). No acute infarct is identified. No extra-axial fluid collection. Vascular:  No hyperdense vessel. Atherosclerotic calcifications. Skull: Normal. Negative for fracture or focal lesion. Sinuses/Orbits: Visualized orbits show no acute finding. Trace mucosal thickening within the right ethmoid air cells. Other: Trace fluid within the bilateral mastoid air cells. IMPRESSION: The patient has multiple known intracranial parenchymal metastases, better appreciated on the prior brain MRI of 02/06/2021. As before, the largest metastasis is present within the right temporal lobe. There is local mass effect associated with this dominant metastasis,  with partial effacement of the right lateral ventricle. However, there is no midline shift. No definitively new metastasis is identified. However, a contrast-enhanced brain MRI would have greater sensitivity for this indication. Redemonstrated scattered foci of chronic encephalomalacia/gliosis within the bilateral cerebral hemispheres. Stable chronic small-vessel ischemic disease within the cerebral white matter and pons. Small chronic infarcts within the left cerebellum, many of which were better appreciated on the prior brain MRI of 02/06/2021 (acute at that time). Trace bilateral mastoid effusions. Electronically Signed   By: Kellie Simmering D.O.   On: 03/14/2021 15:56   MR Brain W and Wo Contrast  Result Date: 03/14/2021 CLINICAL DATA:  History of metastatic lung cancer with brain metastasis. Altered mental status. EXAM: MRI HEAD WITHOUT AND WITH CONTRAST TECHNIQUE: Multiplanar, multiecho pulse sequences of the brain and surrounding structures were obtained without and with intravenous contrast. CONTRAST:  36mL GADAVIST GADOBUTROL 1 MMOL/ML IV SOLN COMPARISON:  MRI head with contrast 02/06/2021 FINDINGS: Brain: Multiple metastatic deposits in the brain are identified on the prior study. The current study is degraded by motion particularly on the postcontrast images. Multiple metastatic deposits are present, the largest in the right temporal lobe. Multiple metastatic lesions continues show restricted diffusion. There is dural-based enhancement in the floor of the middle cranial fossa on the right with adjacent large cystic metastasis in the right temporal lobe. This lesion measures up to 5.0 x 2.8 cm, similar in size to the prior study. There is mild associated hemorrhage and moderate surrounding edema. Adjacent satellite lesion in the right superior temporal lobe measures 17 mm in diameter, unchanged. Enhancing lesion in the left occipital lobe 1 cm unchanged. Subcentimeter lesion left medial temporal lobe  unchanged. Multiple additional enhancing lesions seen on the prior study which is of better quality. Ventricle size normal. 3 mm midline shift to the left unchanged. Chronic infarct left frontal convexity unchanged. Chronic microvascular ischemic change in the pons. Chronic infarct left cerebellum. No acute infarct identified. Vascular: Normal arterial flow voids Skull and upper cervical spine: No skeletal metastasis. Sinuses/Orbits: Negative Other: None IMPRESSION: Multiple metastatic deposits in the brain which appear stable since 02/06/2021. Largest lesion in the right temporal lobe has a significant dural component likely due to dural metastasis or dural invasion. There is surrounding edema and local mass-effect. No significant progression of metastatic disease and no acute infarct identified. Image quality degraded by motion. Electronically Signed   By: Franchot Gallo M.D.   On: 03/14/2021 18:29   DG Chest Port 1 View  Result Date: 03/14/2021 CLINICAL DATA:  Altered level of consciousness. Generalized weakness and hematuria. History of lung cancer. EXAM: PORTABLE CHEST 1 VIEW COMPARISON:  01/18/2021 FINDINGS: A right jugular Port-A-Cath has been placed and terminates over the mid SVC. The cardiomediastinal silhouette is unchanged with normal heart size. Aortic atherosclerosis is noted. Persistent retrocardiac density corresponds to the known medial left lower lobe mass. Mild atelectasis or scarring is again noted more laterally in the left lung base. There is background hyperinflation with underlying emphysema. The right lung  is clear. No pneumothorax is identified. No acute osseous abnormality is seen. IMPRESSION: Known left lower lobe mass with left basilar atelectasis or scarring. Electronically Signed   By: Logan Bores M.D.   On: 03/14/2021 14:34   IR IMAGING GUIDED PORT INSERTION  Result Date: 02/27/2021 INDICATION: 65 year old with left lung cancer. Port-A-Cath needed for chemotherapy. EXAM:  FLUOROSCOPIC AND ULTRASOUND GUIDED PLACEMENT OF A SUBCUTANEOUS PORT COMPARISON:  None. MEDICATIONS: Moderate sedation ANESTHESIA/SEDATION: Versed 1.0 mg IV; Fentanyl 50 mcg IV; Moderate Sedation Time:  25 minutes The patient was continuously monitored during the procedure by the interventional radiology nurse under my direct supervision. FLUOROSCOPY TIME:  42 seconds, 2 mGy COMPLICATIONS: None immediate. PROCEDURE: The procedure, risks, benefits, and alternatives were explained to the patient. Questions regarding the procedure were encouraged and answered. The patient understands and consents to the procedure. Patient was placed supine on the interventional table. Ultrasound confirmed a patent right internal jugular vein. Ultrasound image was saved for documentation. The right chest and neck were cleaned with a skin antiseptic and a sterile drape was placed. Maximal barrier sterile technique was utilized including caps, mask, sterile gowns, sterile gloves, sterile drape, hand hygiene and skin antiseptic. The right neck was anesthetized with 1% lidocaine. Small incision was made in the right neck with a blade. Micropuncture set was placed in the right internal jugular vein with ultrasound guidance. The micropuncture wire was used for measurement purposes. The right chest was anesthetized with 1% lidocaine with epinephrine. #15 blade was used to make an incision and a subcutaneous port pocket was formed. Conway was assembled. Subcutaneous tunnel was formed with a stiff tunneling device. The port catheter was brought through the subcutaneous tunnel. The port was placed in the subcutaneous pocket. The micropuncture set was exchanged for a peel-away sheath. The catheter was placed through the peel-away sheath and the tip was positioned at the superior cavoatrial junction. Catheter placement was confirmed with fluoroscopy. The port was accessed and flushed with heparinized saline. The port pocket was closed  using two layers of absorbable sutures and Dermabond. The vein skin site was closed using a single layer of absorbable suture and Dermabond. Sterile dressings were applied. Patient tolerated the procedure well without an immediate complication. Ultrasound and fluoroscopic images were taken and saved for this procedure. IMPRESSION: Placement of a subcutaneous power-injectable port device. Catheter tip at the superior cavoatrial junction. Electronically Signed   By: Markus Daft M.D.   On: 02/27/2021 17:33   US Abdomen Limited RUQ (LIVER/GB)  Result Date: 03/15/2021 CLINICAL DATA:  Bacteremia EXAM: ULTRASOUND ABDOMEN LIMITED RIGHT UPPER QUADRANT COMPARISON:  None. FINDINGS: Gallbladder: Sludge in the gallbladder. No shadowing stones. Normal wall thickness. Negative sonographic Murphy. Common bile duct: Diameter: 3.5 mm Liver: No focal lesion identified. Within normal limits in parenchymal echogenicity. Portal vein is patent on color Doppler imaging with normal direction of blood flow towards the liver. Other: None. IMPRESSION: 1. Gallbladder sludge without sonographic evidence for acute gallbladder disease. 2. Otherwise negative right upper quadrant abdominal ultrasound Electronically Signed   By: Donavan Foil M.D.   On: 03/15/2021 21:08     Subjective: No new complaints.   Discharge Exam: Vitals:   03/27/21 1308 03/27/21 1527  BP: (!) 112/53 (!) 107/58  Pulse: 78 79  Resp: 14 14  Temp: 97.6 F (36.4 C) 97.9 F (36.6 C)  SpO2: 90% 98%     General: Pt is alert, awake, not in acute distress Cardiovascular: RRR, S1/S2 +, no  rubs, no gallops Respiratory: CTA bilaterally, no wheezing, no rhonchi Abdominal: Soft, NT, ND, bowel sounds + Extremities: no edema, no cyanosis    The results of significant diagnostics from this hospitalization (including imaging, microbiology, ancillary and laboratory) are listed below for reference.     Microbiology: No results found for this or any previous visit  (from the past 240 hour(s)).   Labs: BNP (last 3 results) Recent Labs    03/14/21 1353  BNP 446.2*   Basic Metabolic Panel: Recent Labs  Lab 03/21/21 0259 03/22/21 0656 03/23/21 1140 03/25/21 0500  NA 132* 133* 134* 133*  K 3.5 3.0* 3.6 3.8  CL 97* 99 99 99  CO2 26 28 27 27   GLUCOSE 117* 91 80 96  BUN 19 17 16 16   CREATININE 0.47 0.40* 0.43* 0.36*  CALCIUM 8.1* 8.0* 8.0* 8.1*   Liver Function Tests: No results for input(s): AST, ALT, ALKPHOS, BILITOT, PROT, ALBUMIN in the last 168 hours. No results for input(s): LIPASE, AMYLASE in the last 168 hours. No results for input(s): AMMONIA in the last 168 hours. CBC: Recent Labs  Lab 03/21/21 0259 03/22/21 0656 03/23/21 1140 03/25/21 0500  WBC 29.0* 28.4* 28.0* 26.5*  HGB 8.3* 7.7* 8.3* 8.0*  HCT 23.3* 22.7* 24.6* 23.3*  MCV 83.8 86.0 88.8 89.3  PLT 172 204 218 243   Cardiac Enzymes: No results for input(s): CKTOTAL, CKMB, CKMBINDEX, TROPONINI in the last 168 hours. BNP: Invalid input(s): POCBNP CBG: No results for input(s): GLUCAP in the last 168 hours. D-Dimer No results for input(s): DDIMER in the last 72 hours. Hgb A1c No results for input(s): HGBA1C in the last 72 hours. Lipid Profile No results for input(s): CHOL, HDL, LDLCALC, TRIG, CHOLHDL, LDLDIRECT in the last 72 hours. Thyroid function studies No results for input(s): TSH, T4TOTAL, T3FREE, THYROIDAB in the last 72 hours.  Invalid input(s): FREET3 Anemia work up No results for input(s): VITAMINB12, FOLATE, FERRITIN, TIBC, IRON, RETICCTPCT in the last 72 hours. Urinalysis No results found for: COLORURINE, APPEARANCEUR, LABSPEC, [place], GLUCOSEU, HGBUR, BILIRUBINUR, KETONESUR, PROTEINUR, UROBILINOGEN, NITRITE, LEUKOCYTESUR Sepsis Labs Invalid input(s): PROCALCITONIN,  WBC,  LACTICIDVEN Microbiology No results found for this or any previous visit (from the past 240 hour(s)).   Time coordinating discharge: 40 minutes  SIGNED:   Elmarie Shiley, MD  [facility]

## 2021-03-27 NOTE — Plan of Care (Signed)
  Problem: Education: Goal: Knowledge of disease or condition will improve Outcome: Progressing Goal: Knowledge of secondary prevention will improve Outcome: Progressing Goal: Knowledge of patient specific risk factors addressed and post discharge goals established will improve Outcome: Progressing   Problem: Coping: Goal: Will verbalize positive feelings about self Outcome: Progressing Goal: Will identify appropriate support needs Outcome: Progressing   Problem: Ischemic Stroke/TIA Tissue Perfusion: Goal: Complications of ischemic stroke/TIA will be minimized Outcome: Progressing   Problem: Spontaneous Subarachnoid Hemorrhage Tissue Perfusion: Goal: Complications of Spontaneous Subarachnoid Hemorrhage will be minimized Outcome: Progressing

## 2021-03-27 NOTE — Progress Notes (Signed)
Physical Therapy Treatment Patient Details Name: Loretta Woods MRN: [id number] DOB: [birth year] Today's Date: 03/27/2021   History of Present Illness 65 yo female admitted on 8/31 for progressive weakness from chemotherapy and new confusion with poor appetite.  Pt has family involved, who are aware of these issues.  Dx with acute metabolic encephalopathy from brain mets, bacteremia c mult bacteria.  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 PMHx: seizures, deconditioning, HTN, HLD, lung adenocarcinoma.    PT Comments    Pt received in supine, agreeable to therapy session and with good participation and fair tolerance for transfer training and bed mobility. Pt needing increased assist for standing and pivoting to chair (up to modA for stepping and initially needing maxA to gain balance at RW upon standing). Pt with decreased activity tolerance and limited by pain from pressure sores, RN notified pt will need assist +1-2 for squat or stand pivot back to bed in 45 minutes to an hour after session. Pt continues to benefit from PT services to progress toward functional mobility goals.    Recommendations for follow up therapy are one component of a multi-disciplinary discharge planning process, led by the attending physician.  Recommendations may be updated based on patient status, additional functional criteria and insurance authorization.  Follow Up Recommendations  SNF     Equipment Recommendations  Rolling walker with 5" wheels    Recommendations for Other Services       Precautions / Restrictions Precautions Precautions: Fall Precaution Comments: unstageable b/l pressure injury -buttocks Restrictions Weight Bearing Restrictions: No     Mobility  Bed Mobility Overal bed mobility: Needs Assistance Bed Mobility: Rolling;Sidelying to Sit Rolling: Min assist Sidelying to sit: Mod assist       General bed mobility comments: min assist to scoot legs  off bed and modA for trunk rise, use of bed rail to assist and HOB partially elevated    Transfers Overall transfer level: Needs assistance Equipment used: Rolling walker (2 wheeled) Transfers: Sit to/from [facility] Sit to Stand: Mod assist Stand pivot transfers: Mod assist       General transfer comment: modA to rise and for stand pivot transfer due to fatigue/weakness and needing manual assist to move RW, pt buckling upon initially rising needing up to maxA but able to correct with external assist and RW support  Ambulation/Gait                 Stairs             Wheelchair Mobility    Modified Rankin (Stroke Patients Only)       Balance Overall balance assessment: Needs assistance Sitting-balance support: Bilateral upper extremity supported Sitting balance-Leahy Scale: Fair Sitting balance - Comments: pt needs BUE support, with 0 UE support needs min guard for safety   Standing balance support: Bilateral upper extremity supported;During functional activity Standing balance-Leahy Scale: Poor Standing balance comment: min to maxA for standing balance at RW, increased assist needed for dynamic standing/pivot transfer                            Cognition Arousal/Alertness: Awake/alert Behavior During Therapy: Flat affect Overall Cognitive Status: Impaired/Different from baseline Area of Impairment: Following commands;Attention;Memory;Safety/judgement;Problem solving                   Current Attention Level: Selective Memory: Decreased recall of precautions;Decreased short-term memory Following Commands:  Follows one step commands with increased time     Problem Solving: Slow processing;Difficulty sequencing;Requires verbal cues General Comments: slow responses to all mobility cues and limited energy to stand; pt has difficulty following 2-step instructions (difficulty especially with contralateral hip scooting for  improved chair posture and will likely need physical assist for pressure relief, pt sister Loretta Woods present and receptive to info)      Exercises Other Exercises Other Exercises: supine BLE AROM: ankle pumps, heel slides, hip abduction x5-10 reps ea    General Comments General comments (skin integrity, edema, etc.): continued skin breakdown and moisture on bed pad, new pad placed on bed/chair to reduce maceration risk.      Pertinent Vitals/Pain Pain Assessment: Faces Faces Pain Scale: Hurts even more Pain Location: geomat cushion ordered for room previous session, RN notified it was not in chair at time of session and unit secretary notified to reorder cushion; Recommend it be placed in chair prior to her getting up again, in lieu of pressure relief cushion pt needs to lean L/R every 15 mins for at least 60-120 seconds to relieve pressure; family present, pressure injury handout previously given Pain Descriptors / Indicators: Guarding;Grimacing;Discomfort Pain Intervention(s): Limited activity within patient's tolerance;Monitored during session;Repositioned    Home Living                      Prior Function            PT Goals (current goals can now be found in the care plan section) Acute Rehab PT Goals Patient Stated Goal: to get stronger and go home, less pain on bottom PT Goal Formulation: With patient/family Time For Goal Achievement: 03/30/21 Potential to Achieve Goals: Good Progress towards PT goals: Progressing toward goals    Frequency    Min 2X/week      PT Plan Current plan remains appropriate    Co-evaluation              AM-PAC PT "6 Clicks" Mobility   Outcome Measure  Help needed turning from your back to your side while in a flat bed without using bedrails?: A Little Help needed moving from lying on your back to sitting on the side of a flat bed without using bedrails?: A Lot Help needed moving to and from a bed to a chair (including a  wheelchair)?: A Lot Help needed standing up from a chair using your arms (e.g., wheelchair or bedside chair)?: A Lot Help needed to walk in hospital room?: A Lot Help needed climbing 3-5 steps with a railing? : Total 6 Click Score: 12    End of Session Equipment Utilized During Treatment: Gait belt Activity Tolerance: Patient limited by fatigue;Patient limited by pain Patient left: with call bell/phone within reach;in chair;with family/visitor present (mother and friend present) Nurse Communication: Mobility status;Other (comment) (pt with sacral skin breakdown, check frequently to reposition/for moisture in bed, geomat cushion re-ordered for her room (was in old room but not in her new room)) PT Visit Diagnosis: Unsteadiness on feet (R26.81);Muscle weakness (generalized) (M62.81);Pain;Adult, failure to thrive (R62.7) Pain - Right/Left:  (bilateral) Pain - part of body:  (back/bottom)     Time: 0102-7253 PT Time Calculation (min) (ACUTE ONLY): 20 min  Charges:  $Therapeutic Activity: 8-22 mins                     Jaydn Moscato P., PTA Acute Rehabilitation Services Pager: [phone] Office: [place]  03/27/2021, 2:26 PM

## 2021-03-27 NOTE — TOC Progression Note (Signed)
Transition of Care [facility]) - Progression Note    Patient Details  Name: Loretta Woods MRN: [id number] Date of Birth: 09-Feb-1956  Transition of [facility]) CM/SW [place], [place] Phone Number: 03/27/2021, 2:33 PM  Clinical Narrative:   CSW spoke with Admissions at [facility], and authorization has still not been received at this time. [facility] is asking for more updates. CSW sent a message to PT assigned to see the patient, and then sent updated PT note to [facility] to send to [facility]. CSW also spoke with patient's sister, Loretta Woods, over the phone about barriers to discharge. CSW discussed that we are still waiting on approval from [facility] to move the patient to SNF. Loretta Woods also asked CSW about patient's prognosis and a palliative referral; CSW discussed mentioning it to MD and that MD wanted oncology involved in that decision, so CSW suggested that they continue to reach out to oncology office to ask those questions. Loretta Woods appreciative of update. CSW to continue to follow.    Expected Discharge Plan: Skilled Nursing Facility Barriers to Discharge: Ship broker, Continued Medical Work up, SNF Pending bed offer  Expected Discharge Plan and Services Expected Discharge Plan: [place] Choice: [place] arrangements for the past 2 months: Single Family Home                                       Social Determinants of Health (SDOH) Interventions    Readmission Risk Interventions No flowsheet data found.

## 2021-03-28 DIAGNOSIS — G9341 Metabolic encephalopathy: Secondary | ICD-10-CM | POA: Diagnosis not present

## 2021-03-28 DIAGNOSIS — R7881 Bacteremia: Secondary | ICD-10-CM | POA: Diagnosis not present

## 2021-03-28 LAB — RESP PANEL BY RT-PCR (FLU A&B, COVID) ARPGX2
Influenza A by PCR: NEGATIVE
Influenza B by PCR: NEGATIVE
SARS Coronavirus 2 by RT PCR: NEGATIVE

## 2021-03-28 NOTE — TOC Transition Note (Signed)
Transition of Care [facility]) - CM/SW Discharge Note   Patient Details  Name: Loretta Woods MRN: [id number] Date of Birth: 1955/11/08  [facility]) CM/SW Contact:  Geralynn Ochs, LCSW Phone Number: 03/28/2021, 3:16 PM   Clinical Narrative:   Nurse to call report to [phone], Room [place].    Final next level of care: Skilled Nursing Facility Barriers to Discharge: Barriers Resolved   Patient Goals and CMS Choice Patient states their goals for this hospitalization and ongoing recovery are:: Return home with family support CMS Medicare.gov Compare Post Acute Care list provided to:: Patient Represenative (must comment) (Niece and nephew at bedside) Choice offered to / list presented to : Patient  Discharge Placement              Patient chooses bed at: [facility] Patient to be transferred to facility by: [place] Name of family member notified: Self, [name], Mother at bedside Patient and family notified of of transfer: 03/28/21  Discharge Plan and Services     Post Acute Care Choice: [facility] [place]                               Social Determinants of Health (SDOH) Interventions     Readmission Risk Interventions No flowsheet data found.

## 2021-03-28 NOTE — Progress Notes (Signed)
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  Patient apparently completed brain radiation couple of weeks ago and has been placed on chemotherapy 10 days ago.  She was found to have E. coli/Proteus bacteremia.   Assessment & Plan: Acute metabolic encephalopathy: Improved-awake and alert.  Etiology felt to be multifactorial in etiology from brain mets/bacteremia.  E. coli and Proteus bacteremia: Completed a course of antibiotics.  She is afebrile  Stage IV adenocarcinoma of the lung: Follow-up with oncology in the outpatient setting.    Leukocytosis: Likely secondary to steroid use and possible Neupogen use.  No signs of ongoing infection.    Normocytic anemia: Due to chronic disease/malignancy-stable for outpatient follow-up.  Hypothyroidism: TSH 7.0-continue levothyroxine-repeat TSH in 4 to 6 weeks.  Seizure Disorder: Continue with Keppra  Hypertension: Stable-continue metoprolol.  Hyperlipidemia: Continue with statins.  Pressure injury, POA: See documentation below Pressure Injury 03/15/21 Buttocks Right;Left Unstageable - Full thickness tissue loss in which the base of the injury is covered by slough (yellow, tan, gray, green or brown) and/or eschar (tan, brown or black) in the wound bed. moisture associated skin  (Active)  03/15/21   Location: Buttocks  Location Orientation: Right;Left  Staging: Unstageable - Full thickness tissue loss in which the base of the injury is covered by slough (yellow, tan, gray, green or brown) and/or eschar (tan, brown or black) in the wound bed.  Wound Description (Comments): moisture associated skin damage to bilat buttocks with patchy areas of Unstageable pressure injuries  Present on  Admission: Yes   DVT prophylaxis: SCDs Code Status: DNR Family Communication: Sister-Teresa-[phone] on 9/14 Disposition Plan:  Status is: Inpatient  Remains inpatient appropriate because:IV treatments appropriate due to intensity of illness or inability to take PO  Dispo: The patient is from: Home              Anticipated d/c is to: SNF              Patient currently is  medically stable to d/c.Marland Kitchen awaiting insurance authorization.    Difficult to place patient No   Consultants:  ID  Procedures:  None  Antimicrobials:    Subjective:   Objective: Vitals:   03/28/21 0000 03/28/21 0342 03/28/21 0811 03/28/21 1127  BP: (!) 169/88 (!) 155/72 (!) 165/79 135/76  Pulse: 66 63 63 70  Resp: 15 17 18 18   Temp: 97.9 F (36.6 C) 98.1 F (36.7 C) 98 F (36.7 C) 97.6 F (36.4 C)  TempSrc: Oral Oral Oral Oral  SpO2: 99%  100% 100%  Weight:      Height:        Intake/Output Summary (Last 24 hours) at 03/28/2021 1204 Last data filed at 03/28/2021 0300 Gross per 24 hour  Intake 360 ml  Output --  Net 360 ml    Filed Weights   03/17/21 0015  Weight: 39.8 kg    Examination: Gen Exam: Looks frail-but not in any distress. HEENT:atraumatic, normocephalic Chest: B/L clear to auscultation anteriorly CVS:S1S2 regular Abdomen:soft non tender, non distended Extremities:no edema Neurology: Has generalized weakness. Skin: no rash    Data Reviewed: I have personally reviewed following labs and imaging studies  CBC: Recent Labs  Lab 03/22/21 0656 03/23/21 1140 03/25/21 0500  WBC 28.4* 28.0* 26.5*  HGB 7.7*  8.3* 8.0*  HCT 22.7* 24.6* 23.3*  MCV 86.0 88.8 89.3  PLT 204 218 370    Basic Metabolic Panel: Recent Labs  Lab 03/22/21 0656 03/23/21 1140 03/25/21 0500  NA 133* 134* 133*  K 3.0* 3.6 3.8  CL 99 99 99  CO2 28 27 27   GLUCOSE 91 80 96  BUN 17 16 16   CREATININE 0.40* 0.43* 0.36*  CALCIUM 8.0* 8.0* 8.1*    GFR: Estimated Creatinine Clearance: 44.6  mL/min (A) (by C-G formula based on SCr of 0.36 mg/dL (L)). Liver Function Tests: No results for input(s): AST, ALT, ALKPHOS, BILITOT, PROT, ALBUMIN in the last 168 hours.  No results for input(s): LIPASE, AMYLASE in the last 168 hours. No results for input(s): AMMONIA in the last 168 hours.  Coagulation Profile: No results for input(s): INR, PROTIME in the last 168 hours. Cardiac Enzymes: No results for input(s): CKTOTAL, CKMB, CKMBINDEX, TROPONINI in the last 168 hours. BNP (last 3 results) No results for input(s): PROBNP in the last 8760 hours. HbA1C: No results for input(s): HGBA1C in the last 72 hours. CBG: No results for input(s): GLUCAP in the last 168 hours. Lipid Profile: No results for input(s): CHOL, HDL, LDLCALC, TRIG, CHOLHDL, LDLDIRECT in the last 72 hours. Thyroid Function Tests: No results for input(s): TSH, T4TOTAL, FREET4, T3FREE, THYROIDAB in the last 72 hours. Anemia Panel: No results for input(s): VITAMINB12, FOLATE, FERRITIN, TIBC, IRON, RETICCTPCT in the last 72 hours. Sepsis Labs: No results for input(s): PROCALCITON, LATICACIDVEN in the last 168 hours.   No results found for this or any previous visit (from the past 240 hour(s)).         Radiology Studies: No results found.      Scheduled Meds:  amLODipine  5 mg Oral Daily   atorvastatin  10 mg Oral Daily   Chlorhexidine Gluconate Cloth  6 each Topical Daily   dexamethasone  4 mg Oral BID   feeding supplement  237 mL Oral BID BM   folic acid  1 mg Oral Daily   Gerhardt's butt cream   Topical QID   levETIRAcetam  500 mg Oral BID   levothyroxine  75 mcg Oral Q0600   magic mouthwash  10 mL Oral QID   mouth rinse  15 mL Mouth Rinse BID   metoprolol tartrate  25 mg Oral BID   multivitamin with minerals  1 tablet Oral Daily   sodium chloride flush  10-40 mL Intracatheter Q12H   thiamine injection  100 mg Intravenous Daily   Or   thiamine  100 mg Oral Daily   Continuous Infusions:      LOS: 14 days    Time spent: 35 minutes.     Oren Binet, MD [facility]   If 7PM-7AM, please contact night-coverage [email or web address]  03/28/2021, 12:04 PM

## 2021-03-28 NOTE — Progress Notes (Signed)
Report given to [facility] at [facility]

## 2021-03-28 NOTE — Plan of Care (Signed)
  Problem: Education: Goal: Knowledge of disease or condition will improve Outcome: Adequate for Discharge Goal: Knowledge of secondary prevention will improve Outcome: Adequate for Discharge Goal: Knowledge of patient specific risk factors addressed and post discharge goals established will improve Outcome: Adequate for Discharge   Problem: Coping: Goal: Will verbalize positive feelings about self Outcome: Adequate for Discharge Goal: Will identify appropriate support needs Outcome: Adequate for Discharge   Problem: Health Behavior/Discharge Planning: Goal: Ability to manage health-related needs will improve Outcome: Adequate for Discharge   Problem: Self-Care: Goal: Ability to participate in self-care as condition permits will improve Outcome: Adequate for Discharge Goal: Verbalization of feelings and concerns over difficulty with self-care will improve Outcome: Adequate for Discharge Goal: Ability to communicate needs accurately will improve Outcome: Adequate for Discharge   Problem: Nutrition: Goal: Risk of aspiration will decrease Outcome: Adequate for Discharge Goal: Dietary intake will improve Outcome: Adequate for Discharge   Problem: Intracerebral Hemorrhage Tissue Perfusion: Goal: Complications of Intracerebral Hemorrhage will be minimized Outcome: Adequate for Discharge   Problem: Ischemic Stroke/TIA Tissue Perfusion: Goal: Complications of ischemic stroke/TIA will be minimized Outcome: Adequate for Discharge   Problem: Spontaneous Subarachnoid Hemorrhage Tissue Perfusion: Goal: Complications of Spontaneous Subarachnoid Hemorrhage will be minimized Outcome: Adequate for Discharge   Problem: Education: Goal: Knowledge of General Education information will improve Description: Including pain rating scale, medication(s)/side effects and non-pharmacologic comfort measures Outcome: Adequate for Discharge   Problem: Health Behavior/Discharge Planning: Goal:  Ability to manage health-related needs will improve Outcome: Adequate for Discharge   Problem: Clinical Measurements: Goal: Ability to maintain clinical measurements within normal limits will improve Outcome: Adequate for Discharge Goal: Will remain free from infection Outcome: Adequate for Discharge Goal: Diagnostic test results will improve Outcome: Adequate for Discharge Goal: Respiratory complications will improve Outcome: Adequate for Discharge Goal: Cardiovascular complication will be avoided Outcome: Adequate for Discharge   Problem: Activity: Goal: Risk for activity intolerance will decrease Outcome: Adequate for Discharge   Problem: Nutrition: Goal: Adequate nutrition will be maintained Outcome: Adequate for Discharge   Problem: Coping: Goal: Level of anxiety will decrease Outcome: Adequate for Discharge   Problem: Elimination: Goal: Will not experience complications related to bowel motility Outcome: Adequate for Discharge Goal: Will not experience complications related to urinary retention Outcome: Adequate for Discharge   Problem: Pain Managment: Goal: General experience of comfort will improve Outcome: Adequate for Discharge   Problem: Safety: Goal: Ability to remain free from injury will improve Outcome: Adequate for Discharge   Problem: Skin Integrity: Goal: Risk for impaired skin integrity will decrease Outcome: Adequate for Discharge

## 2021-03-30 ENCOUNTER — Telehealth: Payer: Self-pay | Admitting: *Deleted

## 2021-03-30 ENCOUNTER — Telehealth: Payer: Self-pay | Admitting: Hematology and Oncology

## 2021-03-30 ENCOUNTER — Other Ambulatory Visit: Payer: Self-pay

## 2021-03-30 NOTE — Telephone Encounter (Signed)
Sch per 9/16 in basket, left msg for [facility] and sister

## 2021-03-30 NOTE — Telephone Encounter (Signed)
Scheduling message sent for appts the week of 04/02/21

## 2021-04-04 ENCOUNTER — Inpatient Hospital Stay: Payer: 59

## 2021-04-04 ENCOUNTER — Other Ambulatory Visit: Payer: 59

## 2021-04-04 ENCOUNTER — Inpatient Hospital Stay (HOSPITAL_BASED_OUTPATIENT_CLINIC_OR_DEPARTMENT_OTHER): Payer: 59 | Admitting: Hematology and Oncology

## 2021-04-04 ENCOUNTER — Other Ambulatory Visit: Payer: Self-pay

## 2021-04-04 ENCOUNTER — Ambulatory Visit: Payer: 59 | Admitting: Hematology and Oncology

## 2021-04-04 ENCOUNTER — Inpatient Hospital Stay: Payer: 59 | Attending: Hematology and Oncology

## 2021-04-04 VITALS — BP 132/71 | HR 61 | Temp 98.2°F | Resp 16

## 2021-04-04 DIAGNOSIS — I10 Essential (primary) hypertension: Secondary | ICD-10-CM | POA: Diagnosis not present

## 2021-04-04 DIAGNOSIS — C3432 Malignant neoplasm of lower lobe, left bronchus or lung: Secondary | ICD-10-CM

## 2021-04-04 DIAGNOSIS — Z8052 Family history of malignant neoplasm of bladder: Secondary | ICD-10-CM | POA: Diagnosis not present

## 2021-04-04 DIAGNOSIS — Z87891 Personal history of nicotine dependence: Secondary | ICD-10-CM | POA: Insufficient documentation

## 2021-04-04 DIAGNOSIS — J910 Malignant pleural effusion: Secondary | ICD-10-CM | POA: Diagnosis not present

## 2021-04-04 DIAGNOSIS — Z95828 Presence of other vascular implants and grafts: Secondary | ICD-10-CM | POA: Diagnosis not present

## 2021-04-04 DIAGNOSIS — Z7952 Long term (current) use of systemic steroids: Secondary | ICD-10-CM | POA: Diagnosis not present

## 2021-04-04 DIAGNOSIS — C7931 Secondary malignant neoplasm of brain: Secondary | ICD-10-CM | POA: Insufficient documentation

## 2021-04-04 LAB — CBC WITH DIFFERENTIAL (CANCER CENTER ONLY)
Abs Immature Granulocytes: 2.87 10*3/uL — ABNORMAL HIGH (ref 0.00–0.07)
Basophils Absolute: 0 10*3/uL (ref 0.0–0.1)
Basophils Relative: 0 %
Eosinophils Absolute: 0 10*3/uL (ref 0.0–0.5)
Eosinophils Relative: 0 %
HCT: 25.4 % — ABNORMAL LOW (ref 36.0–46.0)
Hemoglobin: 8.8 g/dL — ABNORMAL LOW (ref 12.0–15.0)
Immature Granulocytes: 11 %
Lymphocytes Relative: 3 %
Lymphs Abs: 0.7 10*3/uL (ref 0.7–4.0)
MCH: 31.8 pg (ref 26.0–34.0)
MCHC: 34.6 g/dL (ref 30.0–36.0)
MCV: 91.7 fL (ref 80.0–100.0)
Monocytes Absolute: 1.6 10*3/uL — ABNORMAL HIGH (ref 0.1–1.0)
Monocytes Relative: 6 %
Neutro Abs: 21.1 10*3/uL — ABNORMAL HIGH (ref 1.7–7.7)
Neutrophils Relative %: 80 %
Platelet Count: 203 10*3/uL (ref 150–400)
RBC: 2.77 MIL/uL — ABNORMAL LOW (ref 3.87–5.11)
RDW: 20.5 % — ABNORMAL HIGH (ref 11.5–15.5)
WBC Count: 26.2 10*3/uL — ABNORMAL HIGH (ref 4.0–10.5)
nRBC: 0.2 % (ref 0.0–0.2)

## 2021-04-04 LAB — CMP (CANCER CENTER ONLY)
ALT: 29 U/L (ref 0–44)
AST: 17 U/L (ref 15–41)
Albumin: 2.3 g/dL — ABNORMAL LOW (ref 3.5–5.0)
Alkaline Phosphatase: 100 U/L (ref 38–126)
Anion gap: 9 (ref 5–15)
BUN: 19 mg/dL (ref 8–23)
CO2: 27 mmol/L (ref 22–32)
Calcium: 8.2 mg/dL — ABNORMAL LOW (ref 8.9–10.3)
Chloride: 98 mmol/L (ref 98–111)
Creatinine: 0.46 mg/dL (ref 0.44–1.00)
GFR, Estimated: >60 mL/min (ref >60)
Glucose, Bld: 86 mg/dL (ref 70–99)
Potassium: 3 mmol/L — ABNORMAL LOW (ref 3.5–5.1)
Sodium: 134 mmol/L — ABNORMAL LOW (ref 135–145)
Total Bilirubin: 0.5 mg/dL (ref 0.3–1.2)
Total Protein: 5.2 g/dL — ABNORMAL LOW (ref 6.5–8.1)

## 2021-04-04 LAB — TSH: TSH: 4.115 u[IU]/mL — ABNORMAL HIGH (ref 0.308–3.960)

## 2021-04-04 MED ORDER — SODIUM CHLORIDE 0.9% FLUSH
10.0000 mL | Freq: Once | INTRAVENOUS | Status: AC
  Administered 2021-04-04: 10 mL via INTRAVENOUS

## 2021-04-04 MED ORDER — HEPARIN SOD (PORK) LOCK FLUSH 100 UNIT/ML IV SOLN
500.0000 [IU] | Freq: Once | INTRAVENOUS | Status: AC
  Administered 2021-04-04: 500 [IU] via INTRAVENOUS

## 2021-04-04 NOTE — Progress Notes (Signed)
Noted that when this RN started to access pt's port that it was still accessed from her hospitalization earlier in September. Dressing noted to have been changed on 03/22/21 and pt discharged to SNF in [place] on 03/27/21 with port still accessed. No mention from [facility] facility in [place] about pt arriving to facility with port accessed. Port flushed easily, labs obtained and port de-accessed.  Dr. Lorenso Courier made aware.  Blood cultures x2 peripherally done prior to transfer back to facility. Safety Zone done

## 2021-04-09 LAB — CULTURE, BLOOD (SINGLE)
Culture: NO GROWTH
Culture: NO GROWTH

## 2021-04-10 ENCOUNTER — Encounter: Payer: Self-pay | Admitting: Hematology and Oncology

## 2021-04-10 NOTE — Progress Notes (Signed)
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  CT head revealed a right temporoparietal mass with associated vasogenic edema.  MRI brain showed multiple enhancing masses consistent with metastatic disease.  The largest lesion is the right temporal lobe with severe surrounding edema and 4 mm leftward midline shift.  CT chest abdomen pelvis with contrast showed a 7.5 cm left lower lobe mass compatible with primary bronchogenic neoplasm and small loculated left pleural effusion. 01/18/2021: establish care with Dr. Lorenso Courier 01/19/2021: bronchoscopy performed, final pathology consistent with Adenocarcinoma of the lung. NGS testing showed TPS score 50%.  02/12/2021: start of whole brain radiation/palliative radiation to the lung.  03/05/2021: Cycle 1 Day 1 of Carboplatin, pembrolizumab, and pemetrexed 03/14/2021-03/28/2021: hospitalization for acute metabolic encephalopathy secondary to E. coli and Proteus bacteremia  Interval History:  Loretta Woods 65 y.o. female with medical history significant for metastatic adenocarcinoma of the lung who presents for a follow up visit. The patient was last seen on 03/05/2021. In the interim since the last visit she had a prolonged hospitalization for acute metabolic encephalopathy secondary to E. coli and Proteus bacteremia.  On exam today Loretta Woods reports she feels considerably better after her discharge from the hospital.  She notes that she has been weak but is doing her best to work with PT and OT in order to improve her strength.  Her appetite is good and she is not having any difficulties with new headache or  vision changes.  She otherwise denies any fevers, chills, sweats, nausea, vomit or diarrhea.  She denies any shortness of breath or cough.  A full 10 point ROS is listed below.  A full 10 point ROS is listed below.   MEDICAL HISTORY:  Past Medical History:  Diagnosis Date   Hypertension    Thyroid disease     SURGICAL HISTORY: Past Surgical History:  Procedure Laterality Date   CRYOTHERAPY  01/19/2021   Procedure: CRYOTHERAPY;  Surgeon: Garner Nash, DO;  Location: [facility];  Service: Pulmonary;;   HEMOSTASIS CONTROL  01/19/2021   Procedure: HEMOSTASIS CONTROL;  Surgeon: Garner Nash, DO;  Location: [facility];  Service: Pulmonary;;   IR IMAGING GUIDED PORT INSERTION  02/27/2021   VIDEO BRONCHOSCOPY  01/19/2021   Procedure: VIDEO BRONCHOSCOPY WITHOUT FLUORO;  Surgeon: Garner Nash, DO;  Location: [facility];  Service: Pulmonary;;    SOCIAL HISTORY: Social History   Socioeconomic History   Marital status: Divorced    Spouse name: Not on file   Number of children: 0   Years of education: Not on file   Highest education level: Not on file  Occupational History   Not on file  Tobacco Use   Smoking status: Former    Packs/day: 1.50    Years: 37.00    Pack years: 55.50    Types: Cigarettes    Start date: 07/15/1973    Quit date: 2012    Years since quitting: 10.7   Smokeless tobacco: Never  Vaping Use   Vaping Use: Every day  Substance and Sexual Activity   Alcohol use: Never   Drug use: Never   Sexual activity: Not on file  Other Topics Concern   Not on  file  Social History Narrative   Not on file   Social Determinants of Health   Financial Resource Strain: Low Risk    Difficulty of Paying Living Expenses: Not hard at all  Food Insecurity: No Food Insecurity   Worried About Charity fundraiser in the Last Year: Never true   Arboriculturist in the Last Year: Never true  Transportation Needs: No Transportation Needs   Lack of Transportation (Medical):  No   Lack of Transportation (Non-Medical): No  Physical Activity: Not on file  Stress: Not on file  Social Connections: Not on file  Intimate Partner Violence: Not on file    FAMILY HISTORY: Family History  Problem Relation Age of Onset   Bladder Cancer Father     ALLERGIES:  has No Known Allergies.  MEDICATIONS:  Current Outpatient Medications  Medication Sig Dispense Refill   acetaminophen (TYLENOL) 500 MG tablet Take 500 mg by mouth every 6 (six) hours as needed for moderate pain.     amLODipine (NORVASC) 5 MG tablet Take 1 tablet (5 mg total) by mouth daily. 30 tablet 0   atorvastatin (LIPITOR) 10 MG tablet Take 10 mg by mouth daily.     citalopram (CELEXA) 20 MG tablet Take 1 tablet (20 mg total) by mouth daily. 30 tablet 5   dexamethasone (DECADRON) 4 MG tablet Take 1 tablet (4 mg total) by mouth 2 (two) times daily. 60 tablet 0   EUTHYROX 75 MCG tablet Take 75 mcg by mouth daily.     folic acid (FOLVITE) 1 MG tablet Take 1 tablet (1 mg total) by mouth daily. 90 tablet 3   levETIRAcetam (KEPPRA) 500 MG tablet Take 1 tablet (500 mg total) by mouth 2 (two) times daily. (Patient taking differently: Take 500 mg by mouth 2 (two) times daily as needed (seizures).) 60 tablet 0   lidocaine-prilocaine (EMLA) cream Apply 1 application topically as needed. (Patient taking differently: Apply 1 application topically as needed (access port).) 30 g 0   magic mouthwash SOLN Take 10 mLs by mouth 4 (four) times daily. 100 mL 0   metoprolol tartrate (LOPRESSOR) 25 MG tablet Take 1 tablet (25 mg total) by mouth 2 (two) times daily. 60 tablet 0   Multiple Vitamins-Minerals (EMERGEN-C IMMUNE) PACK Take 1 packet by mouth daily as needed (immune support).     ondansetron (ZOFRAN) 8 MG tablet Take 1 tablet (8 mg total) by mouth every 8 (eight) hours as needed. (Patient taking differently: Take 8 mg by mouth every 8 (eight) hours as needed for nausea or vomiting.) 30 tablet 0   prochlorperazine  (COMPAZINE) 10 MG tablet Take 1 tablet (10 mg total) by mouth every 6 (six) hours as needed for nausea or vomiting. 30 tablet 0   thiamine 100 MG tablet Take 1 tablet (100 mg total) by mouth daily. 30 tablet 0   No current facility-administered medications for this visit.    REVIEW OF SYSTEMS:   Constitutional: ( - ) fevers, ( - )  chills , ( - ) night sweats Eyes: ( - ) blurriness of vision, ( - ) double vision, ( - ) watery eyes Ears, nose, mouth, throat, and face: ( - ) mucositis, ( - ) sore throat Respiratory: ( - ) cough, ( - ) dyspnea, ( - ) wheezes Cardiovascular: ( - ) palpitation, ( - ) chest discomfort, ( - ) lower extremity swelling Gastrointestinal:  ( - ) nausea, ( - ) heartburn, ( - )  change in bowel habits Skin: ( - ) abnormal skin rashes Lymphatics: ( - ) new lymphadenopathy, ( - ) easy bruising Neurological: ( - ) numbness, ( - ) tingling, ( - ) new weaknesses Behavioral/Psych: ( - ) mood change, ( - ) new changes  All other systems were reviewed with the patient and are negative.  PHYSICAL EXAMINATION: ECOG PERFORMANCE STATUS: 1 - Symptomatic but completely ambulatory  Vitals:   04/04/21 1210  BP: 132/71  Pulse: 61  Resp: 16  Temp: 98.2 F (36.8 C)  SpO2: 99%    There were no vitals filed for this visit.    GENERAL: Well-appearing middle-age Caucasian female, alert, no distress and comfortable SKIN: skin color, texture, turgor are normal, no rashes or significant lesions EYES: conjunctiva are pink and non-injected, sclera clear LUNGS: clear to auscultation and percussion with normal breathing effort HEART: regular rate & rhythm and no murmurs and no lower extremity edema PSYCH: alert & oriented x 3, fluent speech NEURO: no focal motor/sensory deficits  LABORATORY DATA:  I have reviewed the data as listed CBC Latest Ref Rng & Units 04/04/2021 03/25/2021 03/23/2021  WBC 4.0 - 10.5 K/uL 26.2(H) 26.5(H) 28.0(H)  Hemoglobin 12.0 - 15.0 g/dL 8.8(L) 8.0(L)  8.3(L)  Hematocrit 36.0 - 46.0 % 25.4(L) 23.3(L) 24.6(L)  Platelets 150 - 400 K/uL 203 243 218    CMP Latest Ref Rng & Units 04/04/2021 03/25/2021 03/23/2021  Glucose 70 - 99 mg/dL 86 96 80  BUN 8 - 23 mg/dL '19 16 16  ' Creatinine 0.44 - 1.00 mg/dL 0.46 0.36(L) 0.43(L)  Sodium 135 - 145 mmol/L 134(L) 133(L) 134(L)  Potassium 3.5 - 5.1 mmol/L 3.0(L) 3.8 3.6  Chloride 98 - 111 mmol/L 98 99 99  CO2 22 - 32 mmol/L '27 27 27  ' Calcium 8.9 - 10.3 mg/dL 8.2(L) 8.1(L) 8.0(L)  Total Protein 6.5 - 8.1 g/dL 5.2(L) - -  Total Bilirubin 0.3 - 1.2 mg/dL 0.5 - -  Alkaline Phos 38 - 126 U/L 100 - -  AST 15 - 41 U/L 17 - -  ALT 0 - 44 U/L 29 - -   RADIOGRAPHIC STUDIES: I have personally reviewed the radiological images as listed and agreed with the findings in the report: Large left lower lobe lung mass with pleural effusion.  Review of MRI shows multiple brain lesions consistent with metastatic disease. CT Head Wo Contrast  Result Date: 03/14/2021 CLINICAL DATA:  Mental status change, unknown cause. Additional history provided: Confusion and weakness. EXAM: CT HEAD WITHOUT CONTRAST TECHNIQUE: Contiguous axial images were obtained from the base of the skull through the vertex without intravenous contrast. COMPARISON:  Brain MRI 02/06/2021.  Head CT 01/17/2021. FINDINGS: Brain: The patient has multiple known intracranial parenchymal metastases, better appreciated on the prior brain MRI of 02/06/2021. As before, the largest metastasis is present within the right temporal lobe. No definitively new metastasis is identified. There is local mass effect associated with the dominant right temporal lobe metastasis with partial effacement of the right lateral ventricle. However, there is no midline shift. Redemonstrated scattered foci of chronic encephalomalacia/gliosis within the bilateral cerebral hemispheres, the largest again within the mid left frontal lobe (for instance as seen on series 2, image 21). There are foci of  parenchymal calcification within the left frontal and right temporal lobes. Background chronic small-vessel ischemic changes within the cerebral white matter and pons. Small chronic infarcts within the left cerebellum, many of which were better appreciated on the brain MRI of 02/06/2021 (acute  at that time). No acute infarct is identified. No extra-axial fluid collection. Vascular: No hyperdense vessel. Atherosclerotic calcifications. Skull: Normal. Negative for fracture or focal lesion. Sinuses/Orbits: Visualized orbits show no acute finding. Trace mucosal thickening within the right ethmoid air cells. Other: Trace fluid within the bilateral mastoid air cells. IMPRESSION: The patient has multiple known intracranial parenchymal metastases, better appreciated on the prior brain MRI of 02/06/2021. As before, the largest metastasis is present within the right temporal lobe. There is local mass effect associated with this dominant metastasis, with partial effacement of the right lateral ventricle. However, there is no midline shift. No definitively new metastasis is identified. However, a contrast-enhanced brain MRI would have greater sensitivity for this indication. Redemonstrated scattered foci of chronic encephalomalacia/gliosis within the bilateral cerebral hemispheres. Stable chronic small-vessel ischemic disease within the cerebral white matter and pons. Small chronic infarcts within the left cerebellum, many of which were better appreciated on the prior brain MRI of 02/06/2021 (acute at that time). Trace bilateral mastoid effusions. Electronically Signed   By: Kellie Simmering D.O.   On: 03/14/2021 15:56   MR Brain W and Wo Contrast  Result Date: 03/14/2021 CLINICAL DATA:  History of metastatic lung cancer with brain metastasis. Altered mental status. EXAM: MRI HEAD WITHOUT AND WITH CONTRAST TECHNIQUE: Multiplanar, multiecho pulse sequences of the brain and surrounding structures were obtained without and with  intravenous contrast. CONTRAST:  69m GADAVIST GADOBUTROL 1 MMOL/ML IV SOLN COMPARISON:  MRI head with contrast 02/06/2021 FINDINGS: Brain: Multiple metastatic deposits in the brain are identified on the prior study. The current study is degraded by motion particularly on the postcontrast images. Multiple metastatic deposits are present, the largest in the right temporal lobe. Multiple metastatic lesions continues show restricted diffusion. There is dural-based enhancement in the floor of the middle cranial fossa on the right with adjacent large cystic metastasis in the right temporal lobe. This lesion measures up to 5.0 x 2.8 cm, similar in size to the prior study. There is mild associated hemorrhage and moderate surrounding edema. Adjacent satellite lesion in the right superior temporal lobe measures 17 mm in diameter, unchanged. Enhancing lesion in the left occipital lobe 1 cm unchanged. Subcentimeter lesion left medial temporal lobe unchanged. Multiple additional enhancing lesions seen on the prior study which is of better quality. Ventricle size normal. 3 mm midline shift to the left unchanged. Chronic infarct left frontal convexity unchanged. Chronic microvascular ischemic change in the pons. Chronic infarct left cerebellum. No acute infarct identified. Vascular: Normal arterial flow voids Skull and upper cervical spine: No skeletal metastasis. Sinuses/Orbits: Negative Other: None IMPRESSION: Multiple metastatic deposits in the brain which appear stable since 02/06/2021. Largest lesion in the right temporal lobe has a significant dural component likely due to dural metastasis or dural invasion. There is surrounding edema and local mass-effect. No significant progression of metastatic disease and no acute infarct identified. Image quality degraded by motion. Electronically Signed   By: CFranchot GalloM.D.   On: 03/14/2021 18:29   DG Chest Port 1 View  Result Date: 03/14/2021 CLINICAL DATA:  Altered level of  consciousness. Generalized weakness and hematuria. History of lung cancer. EXAM: PORTABLE CHEST 1 VIEW COMPARISON:  01/18/2021 FINDINGS: A right jugular Port-A-Cath has been placed and terminates over the mid SVC. The cardiomediastinal silhouette is unchanged with normal heart size. Aortic atherosclerosis is noted. Persistent retrocardiac density corresponds to the known medial left lower lobe mass. Mild atelectasis or scarring is again noted more laterally in the  left lung base. There is background hyperinflation with underlying emphysema. The right lung is clear. No pneumothorax is identified. No acute osseous abnormality is seen. IMPRESSION: Known left lower lobe mass with left basilar atelectasis or scarring. Electronically Signed   By: Logan Bores M.D.   On: 03/14/2021 14:34   US Abdomen Limited RUQ (LIVER/GB)  Result Date: 03/15/2021 CLINICAL DATA:  Bacteremia EXAM: ULTRASOUND ABDOMEN LIMITED RIGHT UPPER QUADRANT COMPARISON:  None. FINDINGS: Gallbladder: Sludge in the gallbladder. No shadowing stones. Normal wall thickness. Negative sonographic Murphy. Common bile duct: Diameter: 3.5 mm Liver: No focal lesion identified. Within normal limits in parenchymal echogenicity. Portal vein is patent on color Doppler imaging with normal direction of blood flow towards the liver. Other: None. IMPRESSION: 1. Gallbladder sludge without sonographic evidence for acute gallbladder disease. 2. Otherwise negative right upper quadrant abdominal ultrasound Electronically Signed   By: Donavan Foil M.D.   On: 03/15/2021 21:08    ASSESSMENT & PLAN Loretta Woods 65 y.o. female with medical history significant for metastatic adenocarcinoma of the lung who presents for a follow up visit.  After review of the labs, review of the records, discussion with the patient the findings most consistent with metastatic adenocarcinoma of the lung with spread to the brain.  There is also an associated malignant pleural effusion.  Tap of the  pleural effusion did not reveal cancer cells, though these are often quite low yield.  Given these findings I do believe the patient will require systemic therapy.    The patient connected with Dr. Isidore Moos and radiation oncology and proceeded with palliative radiation to the lung mass and whole brain radiation.  The patient's targetable infusion panel showed no clear targets.  She did have a PD-L1 score of 50%.  Given these findings we will plan to proceed with carboplatin, pembrolizumab, pemetrexed as first-line therapy.  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 Isidore Moos.  --plan for Carbo/Pem/Pem chemotherapy until progression or intolerance --Guardant 360 pending.  Foundation 1 showed a PD-L1 score of 50%.  Plan: --plan for Carbo/Pem/Pem chemotherapy until progression or intolerance. Carbo to be dropped after Cycle 4 --repeat CT C/A/P in Oct 2022. MRI brain per Rad/onc --Return to clinic in 2 weeks for Cycle 2 of chemotherapy.  #Port Left Accessed after hospital D/c -- De-accessed port in clinic today. --We will order blood cultures to ensure no blood infection as result of prolonged access  #Supportive Care -- chemotherapy education complete -- port placed -- zofran 40m q8H PRN and compazine 116mPO q6H for nausea -- EMLA cream for port -- no pain medication required at this time.   Orders Placed This Encounter  Procedures   Culture, Blood    Standing Status:   Future    Number of Occurrences:   1    Standing Expiration Date:   04/04/2022   Culture, Blood    Standing Status:   Future    Number of Occurrences:   1    Standing Expiration Date:   04/04/2022     All questions were answered. The patient knows to call the clinic with any problems, questions or concerns.  A total of more than 30 minutes were spent on this encounter with  face-to-face time and non-face-to-face time, including preparing to see the patient, ordering tests and/or medications, counseling the patient and coordination of care as outlined above.  Ledell Peoples, MD Department of Hematology/Oncology [place] at [facility] Phone: [phone] Pager: [phone] Email: [email or web address]  04/10/2021 9:48 AM

## 2021-04-16 ENCOUNTER — Other Ambulatory Visit: Payer: 59

## 2021-04-16 ENCOUNTER — Other Ambulatory Visit: Payer: Self-pay

## 2021-04-16 ENCOUNTER — Inpatient Hospital Stay: Payer: 59

## 2021-04-16 ENCOUNTER — Ambulatory Visit: Payer: 59 | Admitting: Hematology and Oncology

## 2021-04-16 ENCOUNTER — Ambulatory Visit: Payer: 59

## 2021-04-16 ENCOUNTER — Inpatient Hospital Stay: Payer: 59 | Attending: Hematology and Oncology

## 2021-04-16 ENCOUNTER — Inpatient Hospital Stay (HOSPITAL_BASED_OUTPATIENT_CLINIC_OR_DEPARTMENT_OTHER): Payer: 59 | Admitting: Hematology and Oncology

## 2021-04-16 VITALS — BP 120/67 | HR 60 | Temp 98.0°F | Resp 16 | Ht 62.0 in

## 2021-04-16 VITALS — BP 141/66 | HR 57 | Temp 98.6°F | Resp 17

## 2021-04-16 DIAGNOSIS — Z5112 Encounter for antineoplastic immunotherapy: Secondary | ICD-10-CM | POA: Diagnosis not present

## 2021-04-16 DIAGNOSIS — C3432 Malignant neoplasm of lower lobe, left bronchus or lung: Secondary | ICD-10-CM | POA: Diagnosis present

## 2021-04-16 DIAGNOSIS — Z7952 Long term (current) use of systemic steroids: Secondary | ICD-10-CM | POA: Diagnosis not present

## 2021-04-16 DIAGNOSIS — Z79899 Other long term (current) drug therapy: Secondary | ICD-10-CM | POA: Diagnosis not present

## 2021-04-16 DIAGNOSIS — J910 Malignant pleural effusion: Secondary | ICD-10-CM | POA: Insufficient documentation

## 2021-04-16 DIAGNOSIS — G9389 Other specified disorders of brain: Secondary | ICD-10-CM

## 2021-04-16 DIAGNOSIS — I10 Essential (primary) hypertension: Secondary | ICD-10-CM | POA: Diagnosis not present

## 2021-04-16 DIAGNOSIS — Z95828 Presence of other vascular implants and grafts: Secondary | ICD-10-CM | POA: Diagnosis not present

## 2021-04-16 DIAGNOSIS — Z8052 Family history of malignant neoplasm of bladder: Secondary | ICD-10-CM | POA: Diagnosis not present

## 2021-04-16 DIAGNOSIS — Z87891 Personal history of nicotine dependence: Secondary | ICD-10-CM | POA: Insufficient documentation

## 2021-04-16 DIAGNOSIS — C7931 Secondary malignant neoplasm of brain: Secondary | ICD-10-CM | POA: Diagnosis not present

## 2021-04-16 LAB — CBC WITH DIFFERENTIAL (CANCER CENTER ONLY)
Abs Immature Granulocytes: 1.52 10*3/uL — ABNORMAL HIGH (ref 0.00–0.07)
Basophils Absolute: 0.2 10*3/uL — ABNORMAL HIGH (ref 0.0–0.1)
Basophils Relative: 1 %
Eosinophils Absolute: 0 10*3/uL (ref 0.0–0.5)
Eosinophils Relative: 0 %
HCT: 26.5 % — ABNORMAL LOW (ref 36.0–46.0)
Hemoglobin: 9.4 g/dL — ABNORMAL LOW (ref 12.0–15.0)
Immature Granulocytes: 8 %
Lymphocytes Relative: 2 %
Lymphs Abs: 0.4 10*3/uL — ABNORMAL LOW (ref 0.7–4.0)
MCH: 32.8 pg (ref 26.0–34.0)
MCHC: 35.5 g/dL (ref 30.0–36.0)
MCV: 92.3 fL (ref 80.0–100.0)
Monocytes Absolute: 0.9 10*3/uL (ref 0.1–1.0)
Monocytes Relative: 5 %
Neutro Abs: 15.2 10*3/uL — ABNORMAL HIGH (ref 1.7–7.7)
Neutrophils Relative %: 84 %
Platelet Count: 202 10*3/uL (ref 150–400)
RBC: 2.87 MIL/uL — ABNORMAL LOW (ref 3.87–5.11)
RDW: 18.4 % — ABNORMAL HIGH (ref 11.5–15.5)
WBC Count: 18.2 10*3/uL — ABNORMAL HIGH (ref 4.0–10.5)
nRBC: 0 % (ref 0.0–0.2)

## 2021-04-16 LAB — CMP (CANCER CENTER ONLY)
ALT: 35 U/L (ref 0–44)
AST: 14 U/L — ABNORMAL LOW (ref 15–41)
Albumin: 2.4 g/dL — ABNORMAL LOW (ref 3.5–5.0)
Alkaline Phosphatase: 108 U/L (ref 38–126)
Anion gap: 7 (ref 5–15)
BUN: 16 mg/dL (ref 8–23)
CO2: 26 mmol/L (ref 22–32)
Calcium: 8.4 mg/dL — ABNORMAL LOW (ref 8.9–10.3)
Chloride: 99 mmol/L (ref 98–111)
Creatinine: 0.44 mg/dL (ref 0.44–1.00)
GFR, Estimated: >60 mL/min (ref >60)
Glucose, Bld: 90 mg/dL (ref 70–99)
Potassium: 3.8 mmol/L (ref 3.5–5.1)
Sodium: 132 mmol/L — ABNORMAL LOW (ref 135–145)
Total Bilirubin: 0.4 mg/dL (ref 0.3–1.2)
Total Protein: 5 g/dL — ABNORMAL LOW (ref 6.5–8.1)

## 2021-04-16 LAB — TSH: TSH: 14.02 u[IU]/mL — ABNORMAL HIGH (ref 0.308–3.960)

## 2021-04-16 MED ORDER — SODIUM CHLORIDE 0.9 % IV SOLN: Freq: Once | INTRAVENOUS | Status: AC

## 2021-04-16 MED ORDER — HEPARIN SOD (PORK) LOCK FLUSH 100 UNIT/ML IV SOLN
500.0000 [IU] | Freq: Once | INTRAVENOUS | Status: AC | PRN
  Administered 2021-04-16: 500 [IU]

## 2021-04-16 MED ORDER — SODIUM CHLORIDE 0.9% FLUSH
10.0000 mL | INTRAVENOUS | Status: DC | PRN
  Administered 2021-04-16: 10 mL

## 2021-04-16 MED ORDER — SODIUM CHLORIDE 0.9 % IV SOLN
200.0000 mg | Freq: Once | INTRAVENOUS | Status: AC
  Administered 2021-04-16: 200 mg via INTRAVENOUS
  Filled 2021-04-16: qty 8

## 2021-04-16 NOTE — Progress Notes (Signed)
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  CT head revealed a right temporoparietal mass with associated vasogenic edema.  MRI brain showed multiple enhancing masses consistent with metastatic disease.  The largest lesion is the right temporal lobe with severe surrounding edema and 4 mm leftward midline shift.  CT chest abdomen pelvis with contrast showed a 7.5 cm left lower lobe mass compatible with primary bronchogenic neoplasm and small loculated left pleural effusion. 01/18/2021: establish care with Dr. Dorsey 01/19/2021: bronchoscopy performed, final pathology consistent with Adenocarcinoma of the lung. NGS testing showed TPS score 50%.  02/12/2021: start of whole brain radiation/palliative radiation to the lung.  03/05/2021: Cycle 1 Day 1 of Carboplatin, pembrolizumab, and pemetrexed 03/14/2021-03/28/2021: hospitalization for acute metabolic encephalopathy secondary to E. coli and Proteus bacteremia 04/16/2021: proceeding with pembrolizumab monotherapy due to severe deconditioning.   Interval History:  Dalton Droke 65 y.o. female with medical history significant for metastatic adenocarcinoma of the lung who presents for a follow up visit. The patient was last seen on 04/04/2021. In the interim since the last visit she has been stable without much improvement in functional status.   On exam today Mrs. Mckissack reports she has not been receiving much in the way of physical therapy at her rehab facility.  She has been sitting on the edge of the bed but spends most of the time in bed.  She notes that she has not been able to walk herself back  and forth to the restroom and she has been urinating and defecating in a diaper.  She notes that she is doing her best to eat and does enjoy drinking milkshakes.  Fortunately she is not having any issues with pain, shortness of breath, vision changes, or headache.  She otherwise denies any fevers, chills, sweats, nausea, vomit or diarrhea.  She denies any shortness of breath or cough.  A full 10 point ROS is listed below.  A full 10 point ROS is listed below.  We had a discussion with the patient and her family today regarding treatment options moving forward.  I noted that with her current deconditioning that full-strength chemotherapy would not be an option, however I was amenable to monotherapy pembrolizumab for palliation at this time.  They were agreeable to proceeding with this plan.   MEDICAL HISTORY:  Past Medical History:  Diagnosis Date   Hypertension    Thyroid disease     SURGICAL HISTORY: Past Surgical History:  Procedure Laterality Date   CRYOTHERAPY  01/19/2021   Procedure: CRYOTHERAPY;  Surgeon: Icard, Bradley L, DO;  Location: [facility];  Service: Pulmonary;;   HEMOSTASIS CONTROL  01/19/2021   Procedure: HEMOSTASIS CONTROL;  Surgeon: Icard, Bradley L, DO;  Location: [facility];  Service: Pulmonary;;   IR IMAGING GUIDED PORT INSERTION  02/27/2021   VIDEO BRONCHOSCOPY  01/19/2021   Procedure: VIDEO BRONCHOSCOPY WITHOUT FLUORO;  Surgeon: Icard, Bradley L, DO;  Location: [facility];  Service: Pulmonary;;    SOCIAL HISTORY: Social History   Socioeconomic History   Marital status: Divorced    Spouse name: Not on file   Number of children: 0   Years of education: Not on file   Highest education level: Not   on file  Occupational History   Not on file  Tobacco Use   Smoking status: Former    Packs/day: 1.50    Years: 37.00    Pack years: 55.50    Types: Cigarettes    Start date: 07/15/1973    Quit date: 2012    Years since quitting: 10.7   Smokeless tobacco: Never   Vaping Use   Vaping Use: Every day  Substance and Sexual Activity   Alcohol use: Never   Drug use: Never   Sexual activity: Not on file  Other Topics Concern   Not on file  Social History Narrative   Not on file   Social Determinants of Health   Financial Resource Strain: Low Risk    Difficulty of Paying Living Expenses: Not hard at all  Food Insecurity: No Food Insecurity   Worried About Running Out of Food in the Last Year: Never true   Ran Out of Food in the Last Year: Never true  Transportation Needs: No Transportation Needs   Lack of Transportation (Medical): No   Lack of Transportation (Non-Medical): No  Physical Activity: Not on file  Stress: Not on file  Social Connections: Not on file  Intimate Partner Violence: Not on file    FAMILY HISTORY: Family History  Problem Relation Age of Onset   Bladder Cancer Father     ALLERGIES:  has No Known Allergies.  MEDICATIONS:  Current Outpatient Medications  Medication Sig Dispense Refill   acetaminophen (TYLENOL) 500 MG tablet Take 500 mg by mouth every 6 (six) hours as needed for moderate pain.     amLODipine (NORVASC) 5 MG tablet Take 1 tablet (5 mg total) by mouth daily. 30 tablet 0   atorvastatin (LIPITOR) 10 MG tablet Take 10 mg by mouth daily.     citalopram (CELEXA) 20 MG tablet Take 1 tablet (20 mg total) by mouth daily. 30 tablet 5   dexamethasone (DECADRON) 4 MG tablet Take 1 tablet (4 mg total) by mouth 2 (two) times daily. 60 tablet 0   EUTHYROX 75 MCG tablet Take 75 mcg by mouth daily.     folic acid (FOLVITE) 1 MG tablet Take 1 tablet (1 mg total) by mouth daily. 90 tablet 3   levETIRAcetam (KEPPRA) 500 MG tablet Take 1 tablet (500 mg total) by mouth 2 (two) times daily. (Patient taking differently: Take 500 mg by mouth 2 (two) times daily as needed (seizures).) 60 tablet 0   lidocaine-prilocaine (EMLA) cream Apply 1 application topically as needed. (Patient taking differently: Apply 1 application topically  as needed (access port).) 30 g 0   magic mouthwash SOLN Take 10 mLs by mouth 4 (four) times daily. 100 mL 0   metoprolol tartrate (LOPRESSOR) 25 MG tablet Take 1 tablet (25 mg total) by mouth 2 (two) times daily. 60 tablet 0   Multiple Vitamins-Minerals (EMERGEN-C IMMUNE) PACK Take 1 packet by mouth daily as needed (immune support).     ondansetron (ZOFRAN) 8 MG tablet Take 1 tablet (8 mg total) by mouth every 8 (eight) hours as needed. (Patient taking differently: Take 8 mg by mouth every 8 (eight) hours as needed for nausea or vomiting.) 30 tablet 0   prochlorperazine (COMPAZINE) 10 MG tablet Take 1 tablet (10 mg total) by mouth every 6 (six) hours as needed for nausea or vomiting. 30 tablet 0   thiamine 100 MG tablet Take 1 tablet (100 mg total) by mouth daily. 30 tablet 0   No current facility-administered   medications for this visit.   Facility-Administered Medications Ordered in Other Visits  Medication Dose Route Frequency Provider Last Rate Last Admin   sodium chloride flush (NS) 0.9 % injection 10 mL  10 mL Intracatheter PRN Orson Slick, MD   10 mL at 04/16/21 1259    REVIEW OF SYSTEMS:   Constitutional: ( - ) fevers, ( - )  chills , ( - ) night sweats Eyes: ( - ) blurriness of vision, ( - ) double vision, ( - ) watery eyes Ears, nose, mouth, throat, and face: ( - ) mucositis, ( - ) sore throat Respiratory: ( - ) cough, ( - ) dyspnea, ( - ) wheezes Cardiovascular: ( - ) palpitation, ( - ) chest discomfort, ( - ) lower extremity swelling Gastrointestinal:  ( - ) nausea, ( - ) heartburn, ( - ) change in bowel habits Skin: ( - ) abnormal skin rashes Lymphatics: ( - ) new lymphadenopathy, ( - ) easy bruising Neurological: ( - ) numbness, ( - ) tingling, ( - ) new weaknesses Behavioral/Psych: ( - ) mood change, ( - ) new changes  All other systems were reviewed with the patient and are negative.  PHYSICAL EXAMINATION: ECOG PERFORMANCE STATUS: 1 - Symptomatic but completely  ambulatory  Vitals:   04/16/21 1106  BP: 120/67  Pulse: 60  Resp: 16  Temp: 98 F (36.7 C)  SpO2: 100%   There were no vitals filed for this visit.  GENERAL: Well-appearing middle-age Caucasian female, alert, no distress and comfortable SKIN: skin color, texture, turgor are normal, no rashes or significant lesions EYES: conjunctiva are pink and non-injected, sclera clear LUNGS: clear to auscultation and percussion with normal breathing effort HEART: regular rate & rhythm and no murmurs and no lower extremity edema PSYCH: alert & oriented x 3, fluent speech NEURO: no focal motor/sensory deficits  LABORATORY DATA:  I have reviewed the data as listed CBC Latest Ref Rng & Units 04/16/2021 04/04/2021 03/25/2021  WBC 4.0 - 10.5 K/uL 18.2(H) 26.2(H) 26.5(H)  Hemoglobin 12.0 - 15.0 g/dL 9.4(L) 8.8(L) 8.0(L)  Hematocrit 36.0 - 46.0 % 26.5(L) 25.4(L) 23.3(L)  Platelets 150 - 400 K/uL 202 203 243    CMP Latest Ref Rng & Units 04/16/2021 04/04/2021 03/25/2021  Glucose 70 - 99 mg/dL 90 86 96  BUN 8 - 23 mg/dL _0 Creatinine 0.44 - 1.00 mg/dL 0.44 0.46 0.36(L)  Sodium 135 - 145 mmol/L 132(L) 134(L) 133(L)  Potassium 3.5 - 5.1 mmol/L 3.8 3.0(L) 3.8  Chloride 98 - 111 mmol/L 99 98 99  CO2 22 - 32 mmol/L _1 Calcium 8.9 - 10.3 mg/dL 8.4(L) 8.2(L) 8.1(L)  Total Protein 6.5 - 8.1 g/dL 5.0(L) 5.2(L) -  Total Bilirubin 0.3 - 1.2 mg/dL 0.4 0.5 -  Alkaline Phos 38 - 126 U/L 108 100 -  AST 15 - 41 U/L 14(L) 17 -  ALT 0 - 44 U/L 35 29 -   RADIOGRAPHIC STUDIES: I have personally reviewed the radiological images as listed and agreed with the findings in the report: Large left lower lobe lung mass with pleural effusion.  Review of MRI shows multiple brain lesions consistent with metastatic disease. No results found.  ASSESSMENT & PLAN Ryleah Miramontes 65 y.o. female with medical history significant for metastatic adenocarcinoma of the lung who presents for a follow up visit.  After review of  the labs, review of the records, discussion with the patient the findings most consistent with  metastatic adenocarcinoma of the lung with spread to the brain.  There is also an associated malignant pleural effusion.  Tap of the pleural effusion did not reveal cancer cells, though these are often quite low yield.  Given these findings I do believe the patient will require systemic therapy.    The patient connected with Dr. Squire and radiation oncology and proceeded with palliative radiation to the lung mass and whole brain radiation.  The patient's targetable infusion panel showed no clear targets.  She did have a PD-L1 score of 50%.  Given these findings we will plan to proceed with carboplatin, pembrolizumab, pemetrexed as first-line therapy.  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 Squire.  --plan for Carbo/Pem/Pem chemotherapy until progression or intolerance --Guardant 360 pending.  Foundation 1 showed a PD-L1 score of 50%.  Plan: --will HOLD chemotherapy portion of regimen and proceed with Pembrolizumab monotherapy due to deconditioning.  We will consider adding back on the chemotherapy portions at her next visit --repeat CT C/A/P in Apr 18, 2021. MRI brain per Rad/onc --Return to clinic in 3 weeks for reevaluation and consideration of continued treatment  #Deconditioning -- Encourage continued work with rehab and physical therapy at her facility  #Supportive Care -- chemotherapy education complete -- port placed -- zofran 8mg q8H PRN and compazine 10mg PO q6H for nausea -- EMLA cream for port -- no pain medication required at this time.   No orders of the defined types were placed in this encounter.  All questions were answered. The patient knows to call the clinic with any problems, questions or concerns.  A total of more than 30  minutes were spent on this encounter with face-to-face time and non-face-to-face time, including preparing to see the patient, ordering tests and/or medications, counseling the patient and coordination of care as outlined above.   John T. Dorsey, MD Department of Hematology/Oncology [facility] Phone: [phone] Pager: [phone] Email: [email or web address]  04/16/2021 3:59 PM  

## 2021-04-16 NOTE — Patient Instructions (Signed)
[facility] MEDICAL ONCOLOGY  Discharge Instructions: ?Thank you for choosing [facility] to provide your oncology and hematology care.  ? ?If you have a lab appointment with the Cancer Center, please go directly to the Cancer Center and check in at the registration area. ?  ?Wear comfortable clothing and clothing appropriate for easy access to any Portacath or PICC line.  ? ?We strive to give you quality time with your provider. You may need to reschedule your appointment if you arrive late (15 or more minutes).  Arriving late affects you and other patients whose appointments are after yours.  Also, if you miss three or more appointments without notifying the office, you may be dismissed from the clinic at the provider?s discretion.    ?  ?For prescription refill requests, have your pharmacy contact our office and allow 72 hours for refills to be completed.   ? ?Today you received the following chemotherapy and/or immunotherapy agents: Keytruda ?  ?To help prevent nausea and vomiting after your treatment, we encourage you to take your nausea medication as directed. ? ?BELOW ARE SYMPTOMS THAT SHOULD BE REPORTED IMMEDIATELY: ?*FEVER GREATER THAN 100.4 F (38 ?C) OR HIGHER ?*CHILLS OR SWEATING ?*NAUSEA AND VOMITING THAT IS NOT CONTROLLED WITH YOUR NAUSEA MEDICATION ?*UNUSUAL SHORTNESS OF BREATH ?*UNUSUAL BRUISING OR BLEEDING ?*URINARY PROBLEMS (pain or burning when urinating, or frequent urination) ?*BOWEL PROBLEMS (unusual diarrhea, constipation, pain near the anus) ?TENDERNESS IN MOUTH AND THROAT WITH OR WITHOUT PRESENCE OF ULCERS (sore throat, sores in mouth, or a toothache) ?UNUSUAL RASH, SWELLING OR PAIN  ?UNUSUAL VAGINAL DISCHARGE OR ITCHING  ? ?Items with * indicate a potential emergency and should be followed up as soon as possible or go to the Emergency Department if any problems should occur. ? ?Please show the CHEMOTHERAPY ALERT CARD or IMMUNOTHERAPY ALERT CARD at check-in to the  Emergency Department and triage nurse. ? ?Should you have questions after your visit or need to cancel or reschedule your appointment, please contact [facility]  Dept: [phone]  and follow the prompts.  Office hours are 8:00 a.m. to 4:30 p.m. Monday - Friday. Please note that voicemails left after 4:00 p.m. may not be returned until the following business day.  We are closed weekends and major holidays. You have access to a nurse at all times for urgent questions. Please call the main number to the clinic Dept: [phone] and follow the prompts. ? ? ?For any non-urgent questions, you may also contact your provider using MyChart. We now offer e-Visits for anyone 18 and older to request care online for non-urgent symptoms. For details visit [email or web address]. ?  ?Also download the MyChart app! Go to the app store, search "MyChart", open the app, select [facility], and log in with your MyChart username and password. ? ?Due to Covid, a mask is required upon entering the hospital/clinic. If you do not have a mask, one will be given to you upon arrival. For doctor visits, patients may have 1 support person aged 18 or older with them. For treatment visits, patients cannot have anyone with them due to current Covid guidelines and our immunocompromised population.  ? ?

## 2021-04-16 NOTE — Progress Notes (Signed)
Pt accessed by MD nurse

## 2021-04-16 NOTE — Progress Notes (Signed)
Confirmed w/ Dr. Lorenso Courier - today pt will receive Keytruda only.  Next cycle (C3) will be in 3 weeks.  Kennith Center, Pharm.D., CPP 04/16/2021@11 :44 AM

## 2021-04-17 ENCOUNTER — Other Ambulatory Visit: Payer: Self-pay

## 2021-04-17 ENCOUNTER — Telehealth: Payer: Self-pay

## 2021-04-17 ENCOUNTER — Non-Acute Institutional Stay: Payer: 59 | Admitting: Primary Care

## 2021-04-17 DIAGNOSIS — C3432 Malignant neoplasm of lower lobe, left bronchus or lung: Secondary | ICD-10-CM

## 2021-04-17 DIAGNOSIS — C7931 Secondary malignant neoplasm of brain: Secondary | ICD-10-CM

## 2021-04-17 NOTE — Progress Notes (Signed)
[facility] [facility] Palliative Care Consult Note Telephone: [phone]  Fax: [phone]   Date of encounter: 04/17/21 11:21 AM PATIENT NAME: Loretta Woods [place] [id number]   [phone] (home)  DOB: Sep 01, 1955 MRN: [id number] PRIMARY CARE PROVIDER:    Leonard Downing, MD,  [place] [id number] [phone]  REFERRING PROVIDER:   Brayton Mars, MD Brayton Mars, [place] [address] [place],  [place] [address] [phone]   RESPONSIBLE PARTY:    Contact Information     Name Relation Home Work Mobile   [place] Sister [phone]  [phone]   Christa See [phone]  [phone]   [facility] Niece   [phone]        I met face to face with patient in [facility] facility. Palliative Care was asked to follow this patient by consultation request of  Brayton Mars, MD  to address advance care planning and complex medical decision making. This is the initial visit.                                     ASSESSMENT AND PLAN / RECOMMENDATIONS:   Advance Care Planning/Goals of Care: Goals include to maximize quality of life and symptom management. Patient/health care surrogate gave his/her permission to discuss.Our advance care planning conversation included a discussion about:    The value and importance of advance care planning  Exploration of personal, cultural or spiritual beliefs that might influence medical decisions  Exploration of goals of care in the event of a sudden injury or illness  Identification of a healthcare agent = none identified. Sisters and mother are de Print production planner. Review of choices contained in an advance directive document . Decision to de-escalate disease focused treatments due to poor prognosis. CODE STATUS: FULL but sister is outlining comfort measures are her goal. Education provided how hospice would support her and be the planned LOC per above. I spent  35 minutes providing this consultation. More than 50% of the time in this consultation was spent in counseling and care coordination. ----------------------------------------------------------------------------------------------------  Symptom Management/Plan: I met with patient in her nursing home room. She has been at [facility] for nearly 3 weeks for rehab. She was able to answer a few Yes/no questions and was alert and pleasant but a poor historian.   Mobility: I was able to speak with [name], [name]. She outlined patient's progress initially but then has not been able to make sustained progress and in  fact has been losing function. We discussed her recent trip to oncology whereby no more chemotherapy was offered. PT states that patient is now bedbound not able to sit in a wheelchair or to sit on the side of bed. This seems to be due to progressive and profound disease related fatigue.  TREATMENT choice: Has Beryle Flock and has taken x 1. Discussed side effects. Current WBC improved. Has low potassium however at 3.  Nutrition: Continues to lose weight, albumin 2.3, wt loss of 16% in 3 months.  Goals of care:  I later spoke with family member about goals of care. Patient does not have a formal POA but sisters and mother help her with her decisions. I spoke with sister on the phone. I outlined my assessment and  review of recent labs as well as imaging and visit with oncology. Patient's disease is in decline and at a fairly rapid rate. Sister  acknowledges this and does feel that they would be open to a discussion about hospice. We outlined her  inability to tolerate only one round of chemo. Sister endorses it this has made her very debilitated but that her baseline going in was very debilitated.   I provided education on disease process in various treatments and outcomes. I  also discussed hospice and supportive care of hospice, with some indication she could have  improved quality of life  with hospice versus some disease modulating treatments. I outlined which  services hospice could bring  and that she would be appropriate at this point should she decide against further treatments. This is of course their decision and I provided information.   Sister states that they will discuss as a family given the various options for her care and support and will call me. She was interested in taking patient to her home. She lives with her elderly mother and will need  the support of in-home care which we also touched on. She did not have any further questions or comments and will call back if she request a hospice referral. Patient had function assessed on admission three weeks ago to nursing him and  has a slums score of 14 of 30. She no longer capacity to make her decisions as this was equivalent to an advanced dementia although in her case it was due to brain meds. Sister understands and also agrees that her decision-making capacity is very limited.    Follow up Palliative Care Visit: Palliative care will continue to follow for complex medical decision making, advance care planning, and clarification of goals. Return 1 weeks or prn if not admitted to hospice..  This visit was coded based on medical decision making (MDM).  PPS: 30%  HOSPICE ELIGIBILITY/DIAGNOSIS:  yes/lung cancer with brain mets  Chief Complaint: weakness, deficits in self care  HISTORY OF PRESENT ILLNESS:  Loretta Woods is a 65 y.o. year old female  with lung ca with brain mets, weight loss. Admitted to SNF with rehab goals but has gotten weaker and lost function over 3 weeks. She went from sitting in the chair to completely bed bound, eating much less with wt loss of 17% over several months. Denies pain, n/v or constipation. Cancer process is advancing. Could only take 1 round chemo before increased debility.  History obtained from review of EMR, discussion with primary team, and interview with family, facility staff/caregiver  and/or Loretta Woods.  I reviewed available labs, medications, imaging, studies and related documents from the EMR.  Records reviewed and summarized above.   ROS/staff  General: NAD ENMT: denies dysphagia Cardiovascular: denies chest pain, denies DOE Pulmonary: denies cough, denies increased SOB Abdomen: endorses fair  appetite, denies constipation, endorses  incontinence of bowel GU: denies dysuria, endorses incontinence of urine MSK:  endorses weakness,  no falls reported Skin: stage 1 buttock Neurological: endorses sacral  pain, denies insomnia Psych: Endorses positive mood, much compromised cognitive processing speed. Heme/lymph/immuno: denies bruises, abnormal bleeding  Physical Exam: Current and past weights: 81 lbs now, has lost 17% in 3 months, BMI 14.8 Constitutional: NAD General: frail appearing, thin EYES: anicteric sclera, lids intact, no discharge  ENMT: intact hearing, oral mucous membranes moist, dentition intact CV: S1S2, RRR, no LE edema Pulmonary: LCTA, no increased work of breathing, no cough, room air Abdomen: intake  25-50%,  no ascites GU: deferred MSK: ++ sarcopenia, moves all extremities, non ambulatory, bed bound Skin: warm and dry, no rashes or wounds on  visible skin Neuro:  increasing generalized weakness,  severe cognitive impairment, slums 14/30 Psych: non-anxious affect, A and O x 1-2 Hem/lymph/immuno: no widespread bruising CURRENT PROBLEM LIST:  Patient Active Problem List   Diagnosis Date Noted   Bacteremia    Acute metabolic encephalopathy 56/81/2751   Adenocarcinoma of left lung, stage 4 ([place]) 03/08/2021   Malignant neoplasm of lower lobe of left lung ([place]) 02/12/2021   Brain metastases ([place]) 02/02/2021   Endobronchial cancer, left ([facility])    Loculated pleural effusion    Brain mass 01/17/2021   Lung mass 01/17/2021   Hypertensive urgency 01/17/2021   Hyponatremia 01/17/2021   Hypokalemia 01/17/2021   PAST MEDICAL HISTORY:  Active  Ambulatory Problems    Diagnosis Date Noted   Brain mass 01/17/2021   Lung mass 01/17/2021   Hypertensive urgency 01/17/2021   Hyponatremia 01/17/2021   Hypokalemia 01/17/2021   Loculated pleural effusion    Endobronchial cancer, left ([place])    Brain metastases ([place]) 02/02/2021   Malignant neoplasm of lower lobe of left lung ([place]) 02/12/2021   Adenocarcinoma of left lung, stage 4 ([place]) 70/07/7492   Acute metabolic encephalopathy 49/67/5916   Bacteremia    Resolved Ambulatory Problems    Diagnosis Date Noted   No Resolved Ambulatory Problems   Past Medical History:  Diagnosis Date   Hypertension    Thyroid disease    SOCIAL HX:  Social History   Tobacco Use   Smoking status: Former    Packs/day: 1.50    Years: 37.00    Pack years: 55.50    Types: Cigarettes    Start date: 07/15/1973    Quit date: 2012    Years since quitting: 10.7   Smokeless tobacco: Never  Substance Use Topics   Alcohol use: Never   FAMILY HX:  Family History  Problem Relation Age of Onset   Bladder Cancer Father       ALLERGIES: No Known Allergies   PERTINENT MEDICATIONS:  Outpatient Encounter Medications as of 04/17/2021  Medication Sig   acetaminophen (TYLENOL) 500 MG tablet Take 500 mg by mouth every 6 (six) hours as needed for moderate pain.   amLODipine (NORVASC) 5 MG tablet Take 1 tablet (5 mg total) by mouth daily.   atorvastatin (LIPITOR) 10 MG tablet Take 10 mg by mouth daily.   citalopram (CELEXA) 20 MG tablet Take 1 tablet (20 mg total) by mouth daily.   dexamethasone (DECADRON) 4 MG tablet Take 1 tablet (4 mg total) by mouth 2 (two) times daily.   EUTHYROX 75 MCG tablet Take 75 mcg by mouth daily.   folic acid (FOLVITE) 1 MG tablet Take 1 tablet (1 mg total) by mouth daily.   levETIRAcetam (KEPPRA) 500 MG tablet Take 1 tablet (500 mg total) by mouth 2 (two) times daily. (Patient taking differently: Take 500 mg by mouth 2 (two) times daily as needed (seizures).)    lidocaine-prilocaine (EMLA) cream Apply 1 application topically as needed. (Patient taking differently: Apply 1 application topically as needed (access port).)   magic mouthwash SOLN Take 10 mLs by mouth 4 (four) times daily.   metoprolol tartrate (LOPRESSOR) 25 MG tablet Take 1 tablet (25 mg total) by mouth 2 (two) times daily.   Multiple Vitamins-Minerals (EMERGEN-C IMMUNE) PACK Take 1 packet by mouth daily as needed (immune support).   ondansetron (ZOFRAN) 8 MG tablet Take 1 tablet (8 mg total) by mouth every 8 (eight) hours as needed. (Patient taking differently: Take 8 mg by mouth every  8 (eight) hours as needed for nausea or vomiting.)   prochlorperazine (COMPAZINE) 10 MG tablet Take 1 tablet (10 mg total) by mouth every 6 (six) hours as needed for nausea or vomiting.   thiamine 100 MG tablet Take 1 tablet (100 mg total) by mouth daily.   No facility-administered encounter medications on file as of 04/17/2021.   Thank you for the opportunity to participate in the care of Ms. Louk.  The palliative care team will continue to follow. Please call our office at [phone] if we can be of additional assistance.   Jason Coop, NP , DNP, AGPCNP-BC  COVID-19 PATIENT SCREENING TOOL Asked and negative response unless otherwise noted:  Have you had symptoms of covid, tested positive or been in contact with someone with symptoms/positive test in the past 5-10 days?

## 2021-04-17 NOTE — Telephone Encounter (Signed)
Attempted to call pt Loretta Woods to confirm if she is aware of appt for CT CAP 10/5, and if she has oral contrast for scan. Pt did not answer, LVM Loretta Woods for call back.

## 2021-04-18 ENCOUNTER — Telehealth: Payer: Self-pay | Admitting: Primary Care

## 2021-04-18 ENCOUNTER — Telehealth: Payer: Self-pay | Admitting: *Deleted

## 2021-04-18 ENCOUNTER — Ambulatory Visit (HOSPITAL_COMMUNITY): Admission: RE | Admit: 2021-04-18 | Payer: 59 | Source: Ambulatory Visit

## 2021-04-18 NOTE — Telephone Encounter (Signed)
Received call from [facility] referral center asking if Dr. Lorenso Courier can certify pt has terminal illness and that he will be her attending for Hospice. Advised that we were unaware that a referral was made. Reviewed chart and pt was seen by Palliative Care and they recommended Hospice as family did not feel she was strong enough for additional chemo. Pt received only Keytruda this week. Pt currently resides in a facility for 'rehab'. Family feels that she is declining and want to bring her home for end of life care. Discussed with Dr. Lorenso Courier. And he is agreeable to this plan, states she does have a terminal illness and he will be the attending of record. The plan is to have pt discharged from the facility to her mother's home  on 04/23/21 with Hospice Care starting on 04/24/21  Call made to confirm the above with pt's sister Loretta Woods. Left vm message for her to call back to [phone]

## 2021-04-18 NOTE — Telephone Encounter (Signed)
Email contact from [name], sister of patient. She, her mother and other sister have decided to bring patient to her home and enroll in hospice. Referral made per request to [facility]. I have apprised [facility] and [facility].

## 2021-04-19 ENCOUNTER — Telehealth: Payer: Self-pay | Admitting: *Deleted

## 2021-04-19 NOTE — Telephone Encounter (Signed)
TCT patient's sister, [name]. Spoke to her to confirm the plan to have her sister go to their mother's home from the facility on 04/23/21 with Hospice services. She states that is the plan. They all feel that she can get better care at home. She also stated that if [place] improves and gets stronger, they will consider stopping hospice and resuming treatment. Advised that she can call us at any time with questions or concerns and we will see Izora Gala at their request.  [name] appreciated the support and that she will keep [place] up to date on Dondrea's condition.  Dr. Lorenso Courier made aware of the above.

## 2021-04-26 ENCOUNTER — Telehealth: Payer: Self-pay | Admitting: *Deleted

## 2021-04-26 NOTE — Telephone Encounter (Signed)
Faxed orders signed by Dr. Lorenso Courier to [facility] 04/25/2021 Fax confirmation received

## 2021-05-03 ENCOUNTER — Telehealth: Payer: Self-pay | Admitting: Hematology and Oncology

## 2021-05-03 ENCOUNTER — Telehealth: Payer: Self-pay | Admitting: *Deleted

## 2021-05-03 NOTE — Telephone Encounter (Signed)
Cancelled all upcoming appts per 10/20 family request, pt transferred to hospice

## 2021-05-03 NOTE — Telephone Encounter (Signed)
Faxed orders signed by Dr. Lorenso Courier to [facility] - Fax confirmation received  Signed orders sent to be scanned into patient chart

## 2021-05-07 ENCOUNTER — Inpatient Hospital Stay: Payer: 59

## 2021-05-07 ENCOUNTER — Telehealth: Payer: Self-pay | Admitting: *Deleted

## 2021-05-07 ENCOUNTER — Inpatient Hospital Stay: Payer: 59 | Admitting: Hematology and Oncology

## 2021-05-07 NOTE — Telephone Encounter (Signed)
Received fax stating patient died 06-May-2021 @ 1945

## 2021-05-15 DEATH — deceased

## 2021-05-25 ENCOUNTER — Other Ambulatory Visit: Payer: 59

## 2021-05-28 ENCOUNTER — Ambulatory Visit: Payer: 59

## 2021-05-28 ENCOUNTER — Other Ambulatory Visit: Payer: 59

## 2021-05-28 ENCOUNTER — Ambulatory Visit: Payer: 59 | Admitting: Hematology and Oncology

## 2021-05-29 ENCOUNTER — Ambulatory Visit: Payer: 59 | Admitting: Radiation Oncology

## 2021-05-29 ENCOUNTER — Ambulatory Visit: Payer: Self-pay | Admitting: Radiation Oncology

## 2021-06-19 ENCOUNTER — Ambulatory Visit: Payer: 59 | Admitting: Physician Assistant

## 2021-06-19 ENCOUNTER — Ambulatory Visit: Payer: 59

## 2021-06-19 ENCOUNTER — Other Ambulatory Visit: Payer: 59

## 2021-07-10 ENCOUNTER — Ambulatory Visit: Payer: 59 | Admitting: Hematology and Oncology

## 2021-07-10 ENCOUNTER — Ambulatory Visit: Payer: 59

## 2021-07-10 ENCOUNTER — Other Ambulatory Visit: Payer: 59

## 2022-11-08 IMAGING — CT CT HEAD W/O CM
3 series · 15 of 46 positions shown, 18 images · non-contrast
Comparison: None.

CLINICAL DATA: Mental status change

EXAM:
CT HEAD WITHOUT CONTRAST
TECHNIQUE: Contiguous axial images were obtained from the base of the skull
through the vertex without intravenous contrast.

[Series 2: head wo · axial · 0.39mm/px · z∈[-140,-20]mm · 9 of 29 slices shown, 12 images]
[im 3/29  brain]
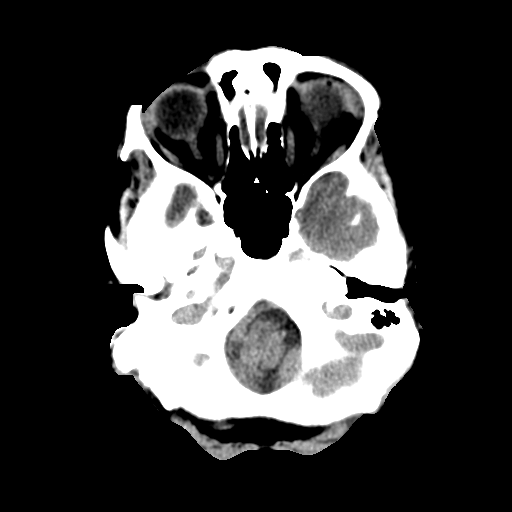
[im 3/29  bone]
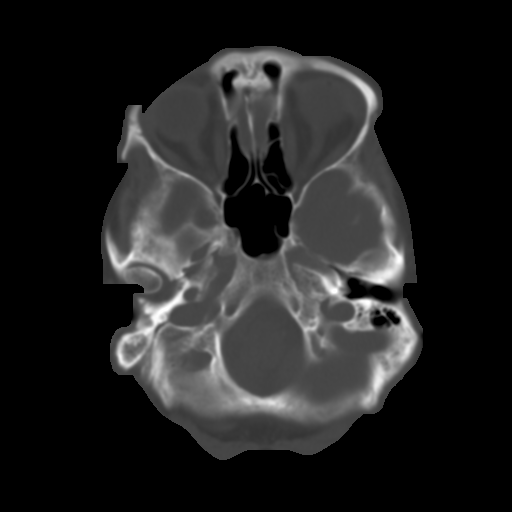
[im 6/29  brain]
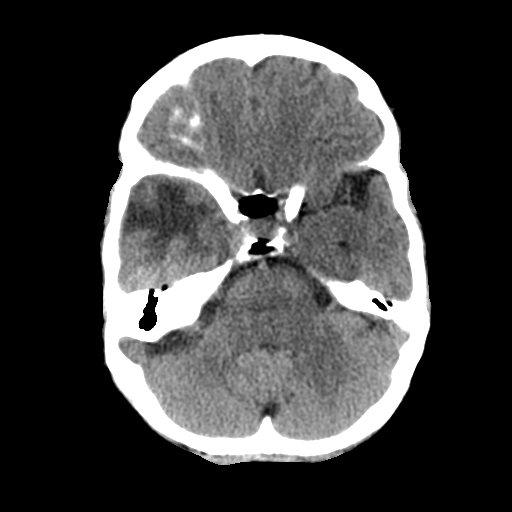
[im 9/29  brain]
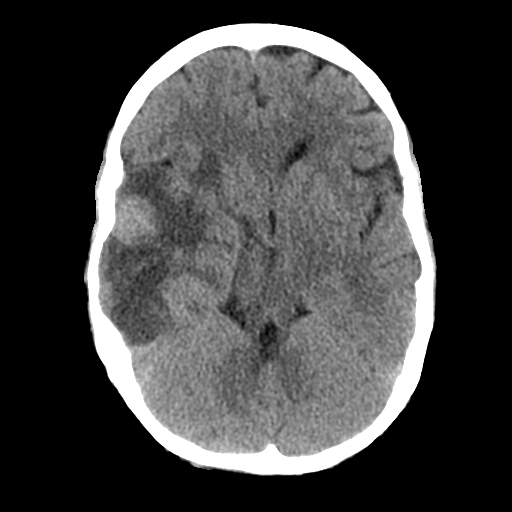
[im 12/29  brain]
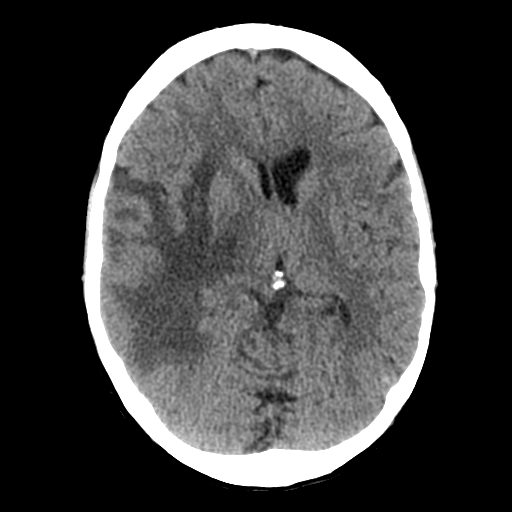
[im 15/29  brain]
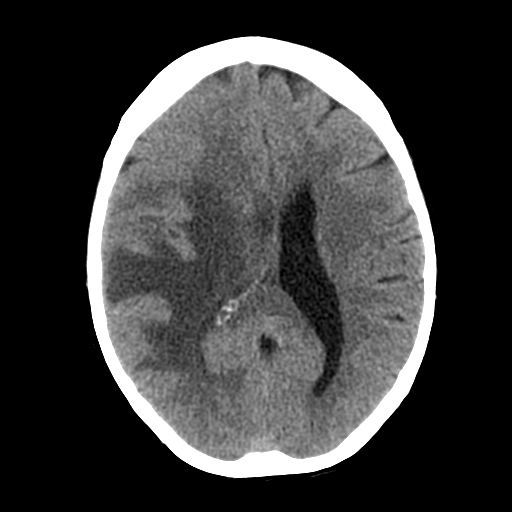
[im 15/29  bone]
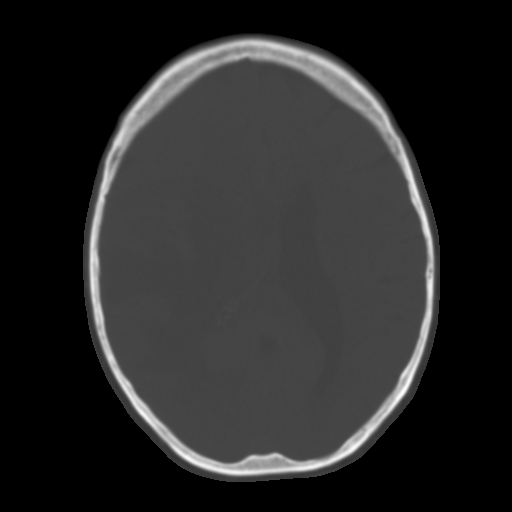
[im 18/29  brain]
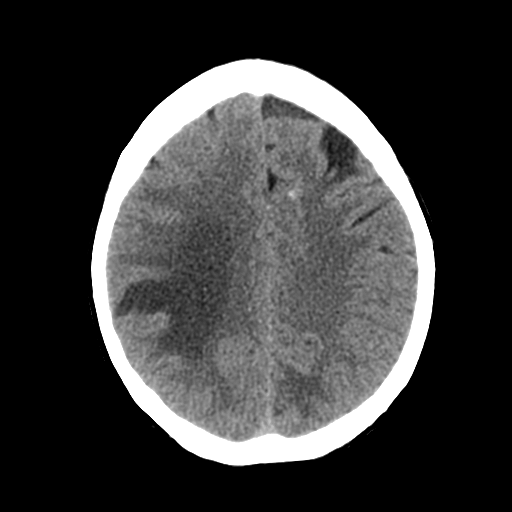
[im 21/29  brain]
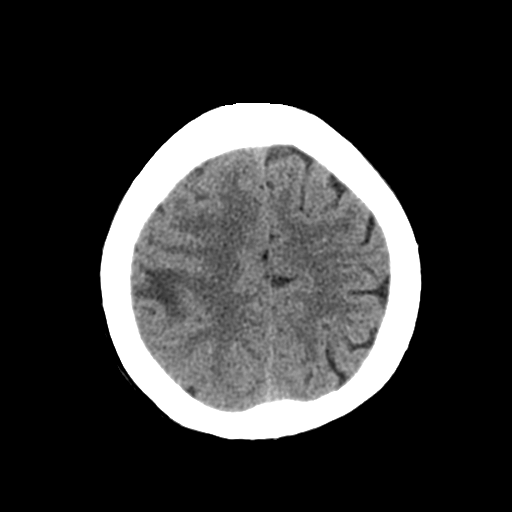
[im 24/29  brain]
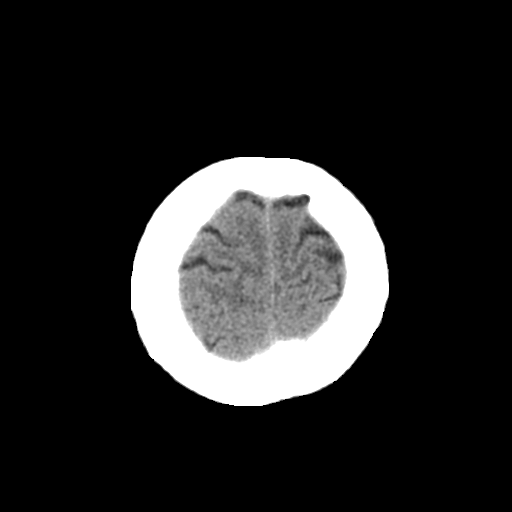
[im 27/29  brain]
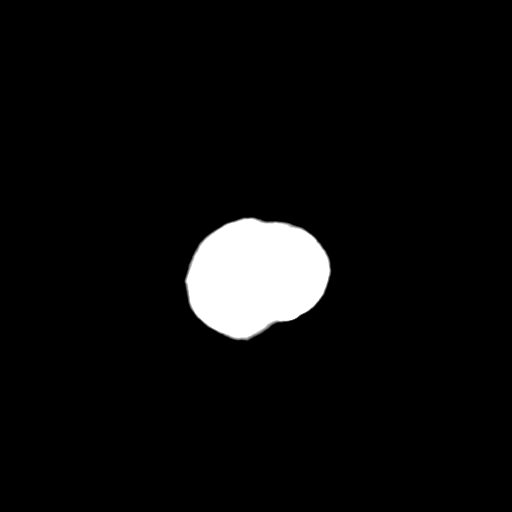
[im 27/29  bone]
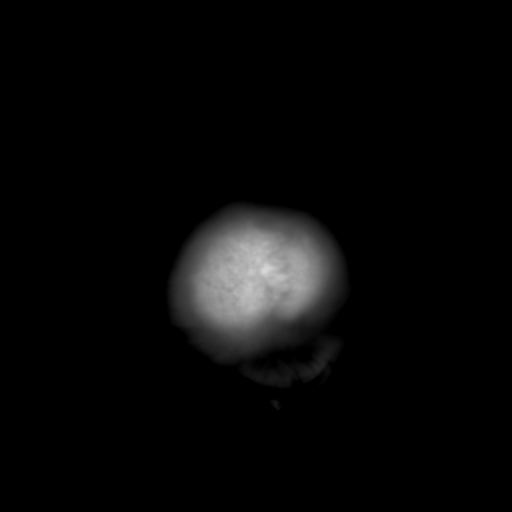

[Series 5: coronal soft tissue · coronal · 0.28mm/px · 3 of 64 slices shown]
[im 22/64  brain]
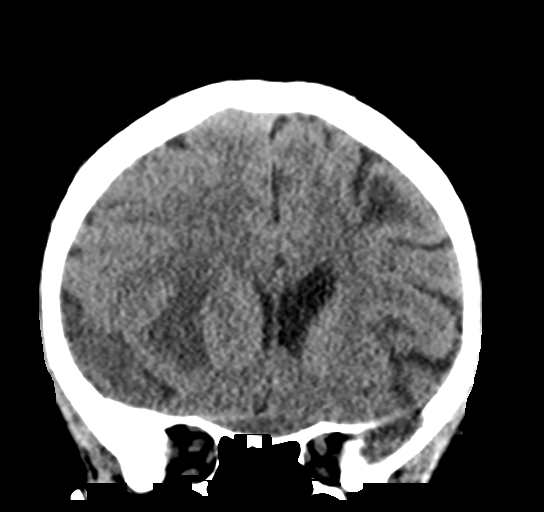
[im 29/64  brain]
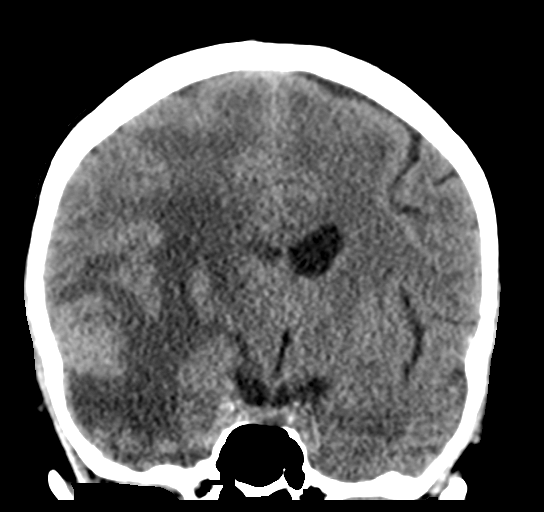
[im 36/64  brain]
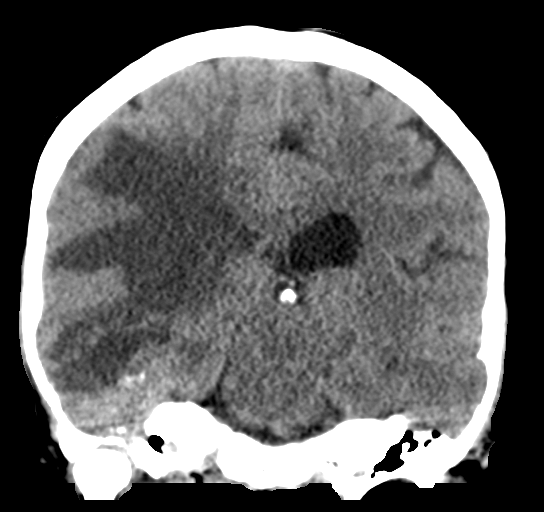

[Series 6: sagittal soft tissue · sagittal · 0.28mm/px · 3 of 52 slices shown]
[im 18/52  brain]
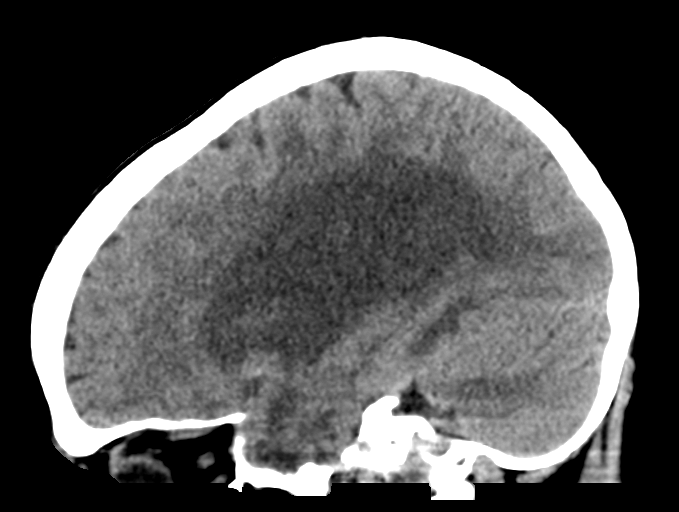
[im 26/52  brain]
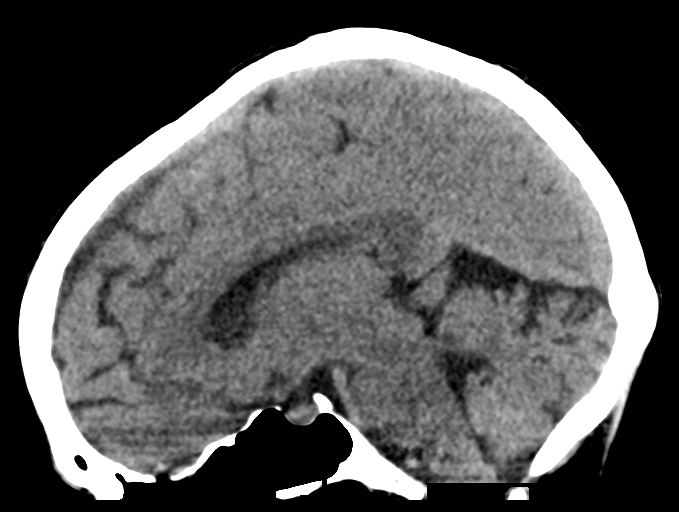
[im 35/52  brain]
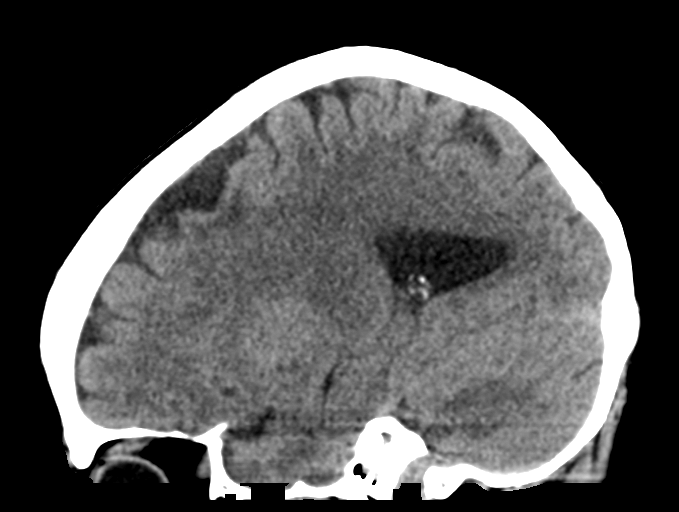

[15 of 46 positions shown; findings below may reference images not displayed]

FINDINGS: Brain: Extensive hypodensity/edema within the right hemispheric
white matter and basal ganglia. 2.7 x 1.9 by 2.3 cm suspected mass
within the right temporoparietal region. About 5 mm midline shift to
the left. Mass effect on the right lateral ventricle. Possible small
chronic appearing infarct in the right cerebellum peripherally.
Punctate cortical calcification at the left frontal lobe.

Vascular: No hyperdense vessels.  No unexpected calcification.

Skull: Normal. Negative for fracture or focal lesion.

Sinuses/Orbits: No acute finding.

Other: None.
IMPRESSION: 1. Extensive white matter edema within the right hemisphere with
about 5 mm midline shift to the left and mass effect on right
lateral ventricle. Approximate 2.7 cm suspected mass in the right
temporoparietal region concerning for metastatic focus or primary
brain neoplasm. MRI with and without contrast is recommended for
further evaluation.

Critical Value/emergent results were called by telephone at the time
of interpretation on 01/17/2021 at [DATE] to provider Focattoreto in the ED,
patient in triage , who verbally acknowledged these results.

## 2023-01-04 IMAGING — US US ABDOMEN LIMITED
1 series · 14 of 25 positions shown · non-contrast
Comparison: None.

CLINICAL DATA: Bacteremia

EXAM:
ULTRASOUND ABDOMEN LIMITED RIGHT UPPER QUADRANT

[Series 1: us abdomen limited ruq (liver/gb) · 14 of 56 slices shown]
[im 1/56]
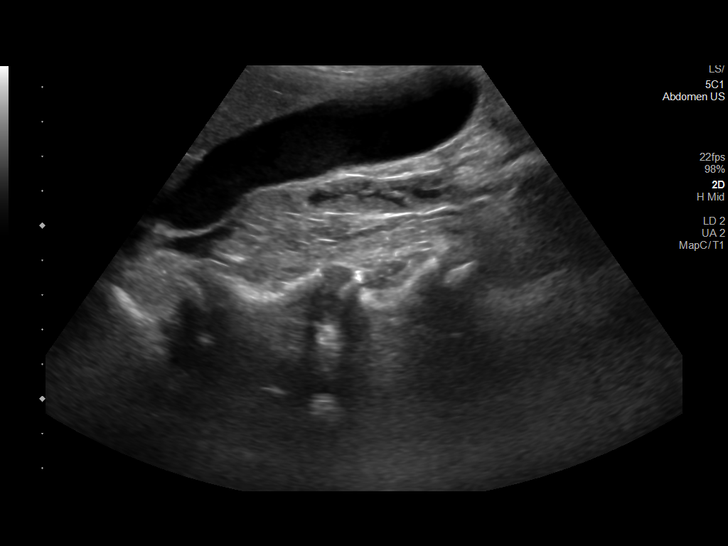
[im 5/56]
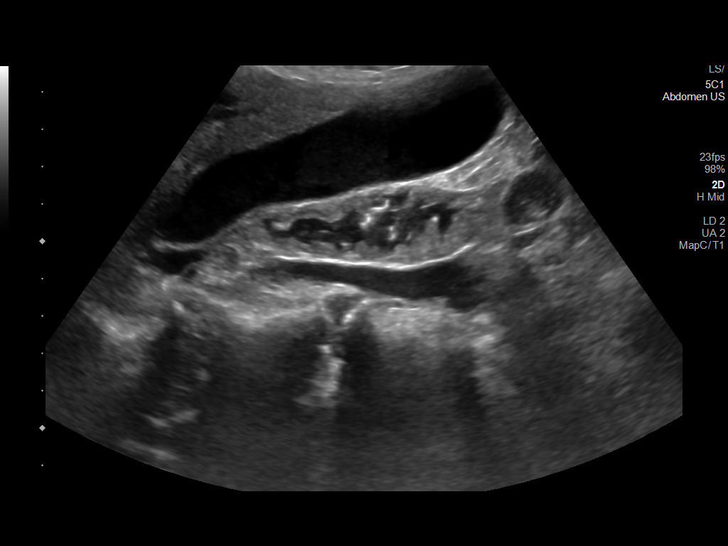
[im 10/56]
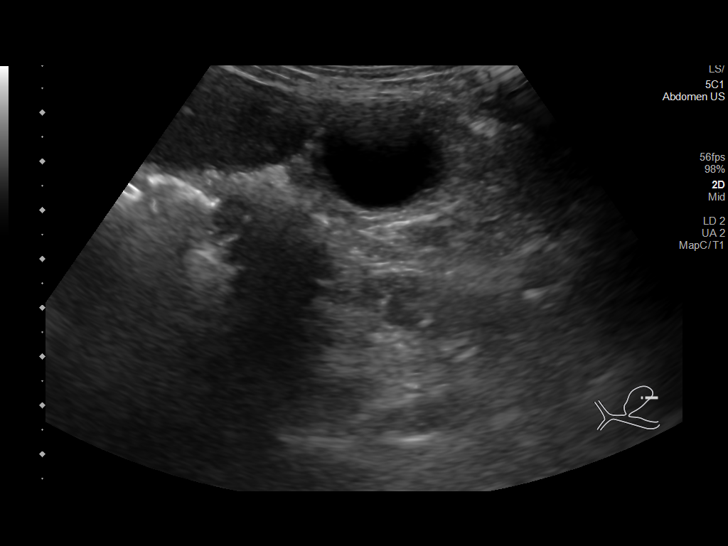
[im 14/56]
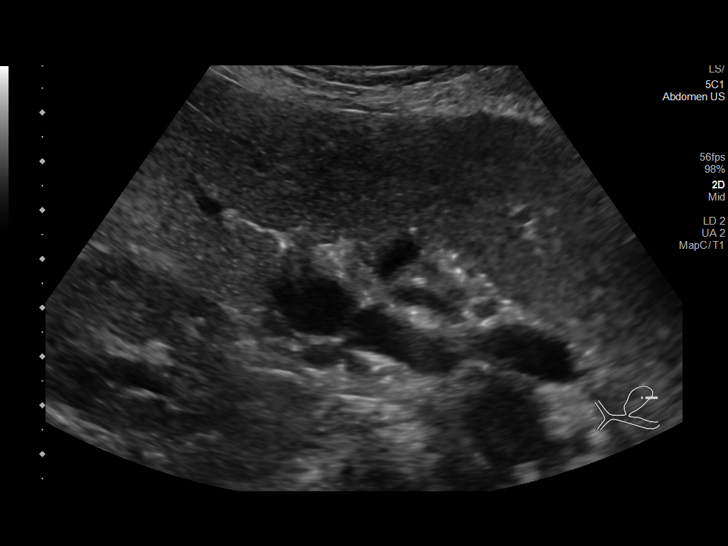
[im 19/56]
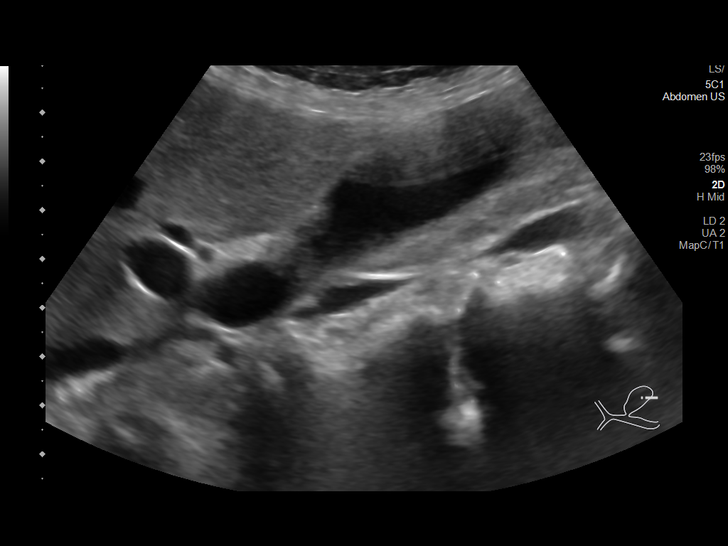
[im 21/56]
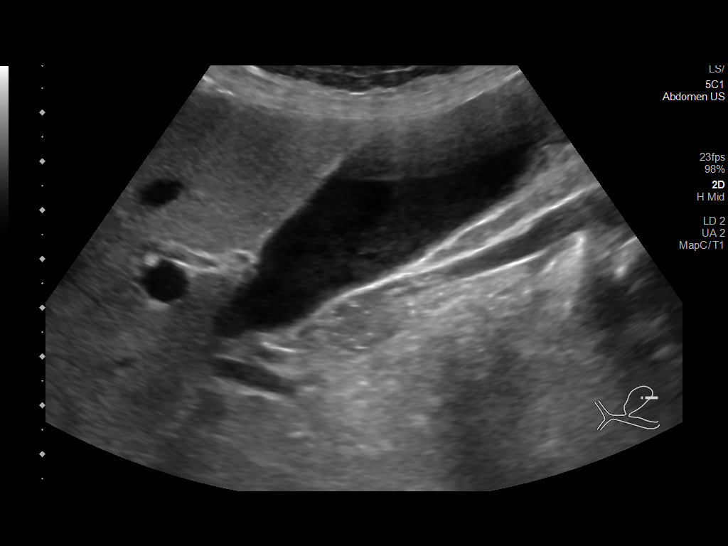
[im 26/56]
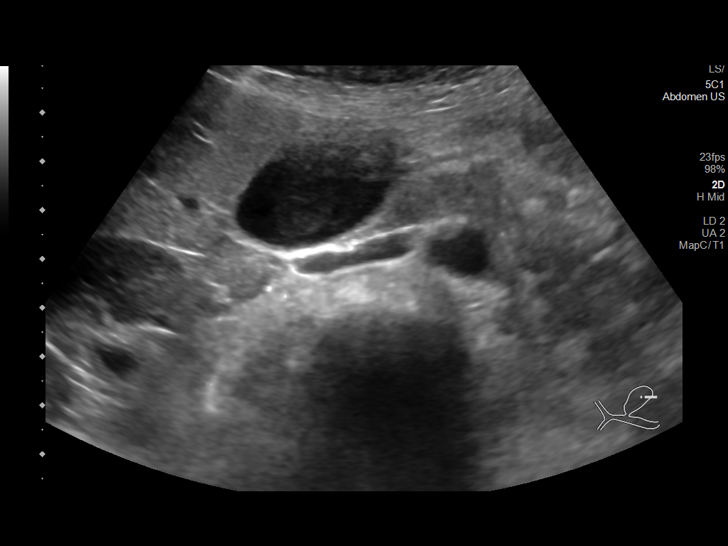
[im 30/56]
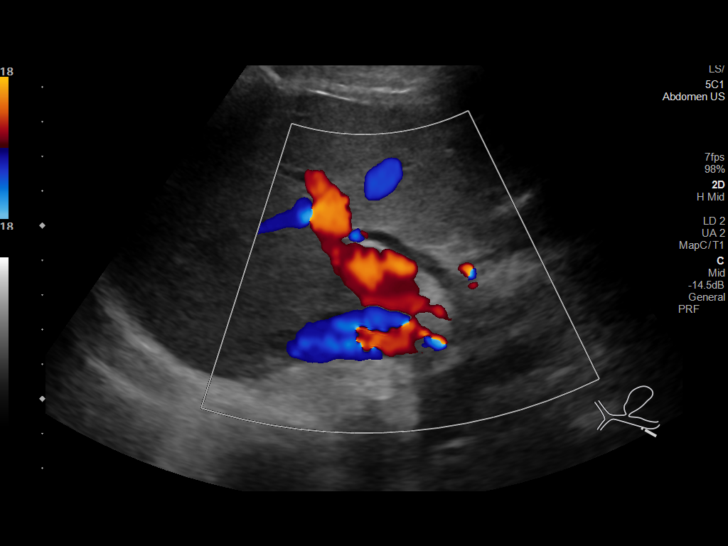
[im 35/56]
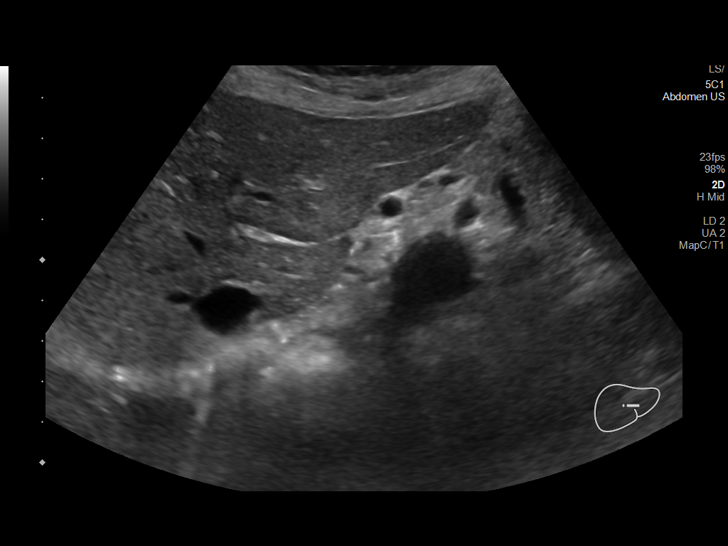
[im 37/56]
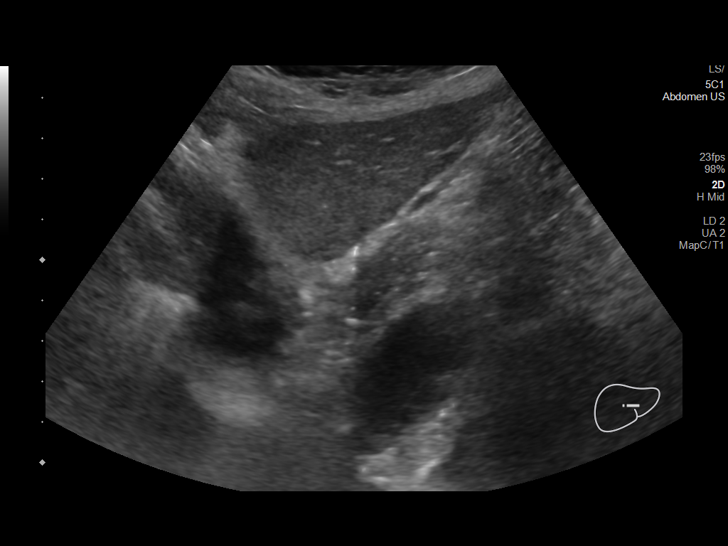
[im 42/56]
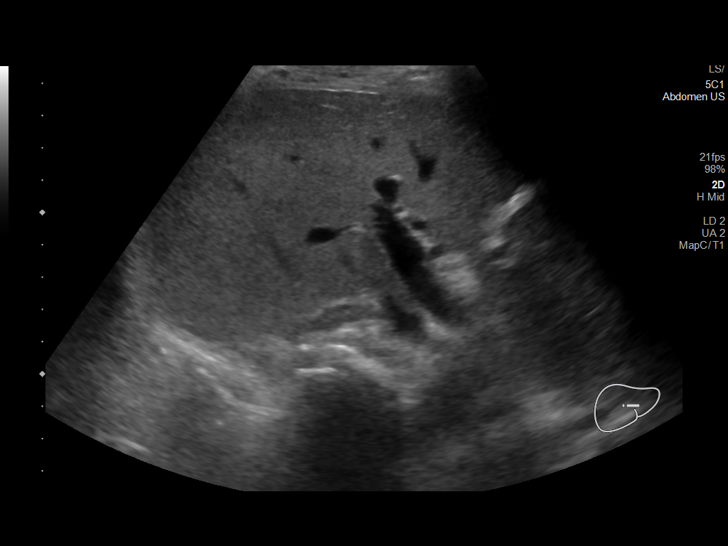
[im 46/56]
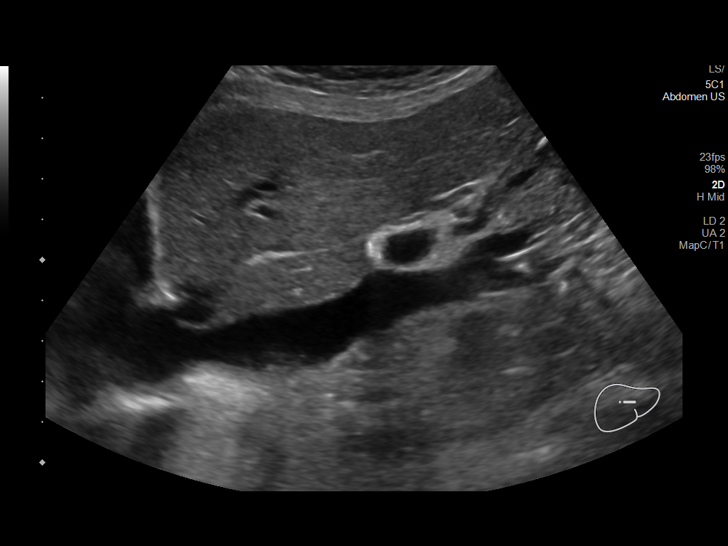
[im 51/56]
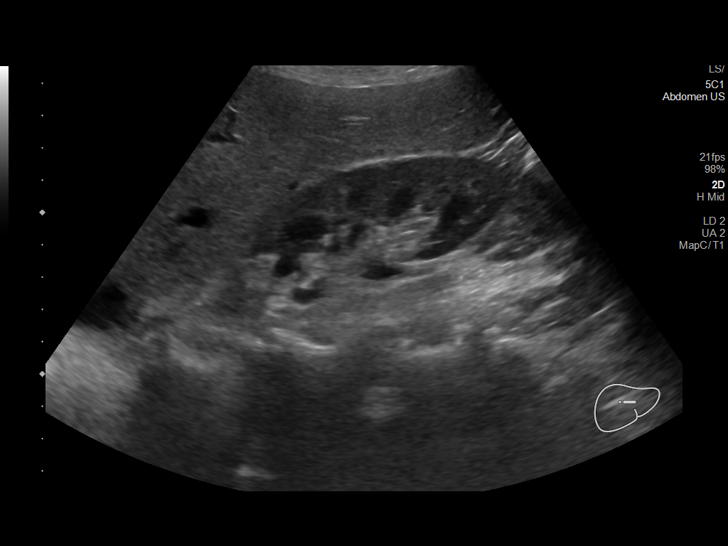
[im 56/56]
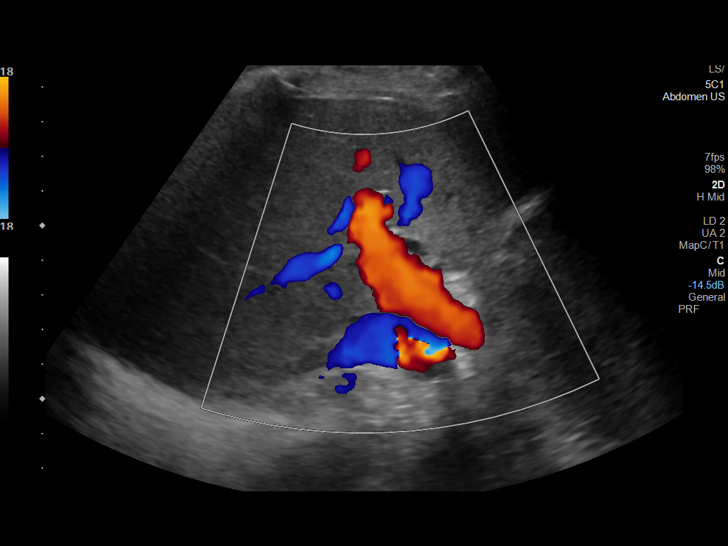

[14 of 25 positions shown; findings below may reference images not displayed]

FINDINGS: Gallbladder:

Sludge in the gallbladder. No shadowing stones. Normal wall
thickness. Negative sonographic Murphy.

Common bile duct:

Diameter: 3.5 mm

Liver:

No focal lesion identified. Within normal limits in parenchymal
echogenicity. Portal vein is patent on color Doppler imaging with
normal direction of blood flow towards the liver.

Other: None.
IMPRESSION: 1. Gallbladder sludge without sonographic evidence for acute
gallbladder disease.
2. Otherwise negative right upper quadrant abdominal ultrasound
